# Patient Record
Sex: Female | Born: 1950 | Race: White | Hispanic: No | Marital: Married | Smoking: Former smoker
Health system: Southern US, Community
[De-identification: ages and names within clinical notes are randomized; demographics above are authoritative.]

## PROBLEM LIST (undated history)

## (undated) DIAGNOSIS — I10 Essential (primary) hypertension: Secondary | ICD-10-CM

## (undated) DIAGNOSIS — I219 Acute myocardial infarction, unspecified: Secondary | ICD-10-CM

## (undated) DIAGNOSIS — E079 Disorder of thyroid, unspecified: Secondary | ICD-10-CM

## (undated) HISTORY — DX: Acute myocardial infarction, unspecified: I21.9

## (undated) HISTORY — DX: Essential (primary) hypertension: I10

## (undated) HISTORY — DX: Disorder of thyroid, unspecified: E07.9

## (undated) HISTORY — PX: CARDIAC CATHETERIZATION: SHX172

## (undated) HISTORY — PX: CHOLECYSTECTOMY: SHX55

## (undated) HISTORY — PX: CORONARY ANGIOPLASTY WITH STENT PLACEMENT: SHX49

## (undated) HISTORY — PX: ABDOMINAL HYSTERECTOMY: SHX81

---

## 1998-09-07 ENCOUNTER — Other Ambulatory Visit: Admission: RE | Admit: 1998-09-07 | Discharge: 1998-09-07 | Payer: Self-pay | Admitting: Obstetrics and Gynecology

## 2000-01-29 ENCOUNTER — Other Ambulatory Visit: Admission: RE | Admit: 2000-01-29 | Discharge: 2000-01-29 | Payer: Self-pay | Admitting: Obstetrics and Gynecology

## 2001-04-09 ENCOUNTER — Other Ambulatory Visit: Admission: RE | Admit: 2001-04-09 | Discharge: 2001-04-09 | Payer: Self-pay | Admitting: Obstetrics and Gynecology

## 2001-11-23 ENCOUNTER — Other Ambulatory Visit: Admission: RE | Admit: 2001-11-23 | Discharge: 2001-11-23 | Payer: Self-pay | Admitting: Obstetrics and Gynecology

## 2003-09-05 ENCOUNTER — Other Ambulatory Visit: Admission: RE | Admit: 2003-09-05 | Discharge: 2003-09-05 | Payer: Self-pay | Admitting: Obstetrics and Gynecology

## 2004-09-27 ENCOUNTER — Other Ambulatory Visit: Admission: RE | Admit: 2004-09-27 | Discharge: 2004-09-27 | Payer: Self-pay | Admitting: Obstetrics and Gynecology

## 2005-01-14 ENCOUNTER — Inpatient Hospital Stay (HOSPITAL_COMMUNITY): Admission: RE | Admit: 2005-01-14 | Discharge: 2005-01-17 | Payer: Self-pay | Admitting: Obstetrics and Gynecology

## 2011-07-23 ENCOUNTER — Other Ambulatory Visit (HOSPITAL_COMMUNITY): Payer: Self-pay | Admitting: Obstetrics and Gynecology

## 2011-07-24 ENCOUNTER — Ambulatory Visit (HOSPITAL_COMMUNITY)
Admission: RE | Admit: 2011-07-24 | Discharge: 2011-07-24 | Disposition: A | Discharge status: Home / Self Care | Payer: PRIVATE HEALTH INSURANCE | Source: Ambulatory Visit | Attending: Obstetrics and Gynecology | Admitting: Obstetrics and Gynecology

## 2011-07-24 DIAGNOSIS — R109 Unspecified abdominal pain: Secondary | ICD-10-CM | POA: Insufficient documentation

## 2011-07-24 DIAGNOSIS — K5730 Diverticulosis of large intestine without perforation or abscess without bleeding: Secondary | ICD-10-CM | POA: Insufficient documentation

## 2012-10-26 DIAGNOSIS — J069 Acute upper respiratory infection, unspecified: Secondary | ICD-10-CM | POA: Diagnosis not present

## 2013-03-25 DIAGNOSIS — S63639A Sprain of interphalangeal joint of unspecified finger, initial encounter: Secondary | ICD-10-CM | POA: Diagnosis not present

## 2013-05-02 ENCOUNTER — Other Ambulatory Visit: Payer: Self-pay | Admitting: Obstetrics and Gynecology

## 2013-05-02 DIAGNOSIS — Z1212 Encounter for screening for malignant neoplasm of rectum: Secondary | ICD-10-CM | POA: Diagnosis not present

## 2013-05-02 DIAGNOSIS — Z01419 Encounter for gynecological examination (general) (routine) without abnormal findings: Secondary | ICD-10-CM | POA: Diagnosis not present

## 2013-05-02 DIAGNOSIS — Z1231 Encounter for screening mammogram for malignant neoplasm of breast: Secondary | ICD-10-CM | POA: Diagnosis not present

## 2013-06-16 DIAGNOSIS — Z7982 Long term (current) use of aspirin: Secondary | ICD-10-CM | POA: Diagnosis not present

## 2013-06-16 DIAGNOSIS — R079 Chest pain, unspecified: Secondary | ICD-10-CM | POA: Diagnosis not present

## 2013-06-16 DIAGNOSIS — E038 Other specified hypothyroidism: Secondary | ICD-10-CM | POA: Diagnosis not present

## 2013-06-16 DIAGNOSIS — R0789 Other chest pain: Secondary | ICD-10-CM | POA: Diagnosis not present

## 2013-06-16 DIAGNOSIS — I208 Other forms of angina pectoris: Secondary | ICD-10-CM | POA: Diagnosis not present

## 2013-06-16 DIAGNOSIS — I2510 Atherosclerotic heart disease of native coronary artery without angina pectoris: Secondary | ICD-10-CM | POA: Diagnosis not present

## 2013-06-16 DIAGNOSIS — E785 Hyperlipidemia, unspecified: Secondary | ICD-10-CM | POA: Diagnosis not present

## 2013-06-16 DIAGNOSIS — R21 Rash and other nonspecific skin eruption: Secondary | ICD-10-CM | POA: Diagnosis not present

## 2013-06-16 DIAGNOSIS — R0989 Other specified symptoms and signs involving the circulatory and respiratory systems: Secondary | ICD-10-CM | POA: Diagnosis not present

## 2013-06-16 DIAGNOSIS — Z79899 Other long term (current) drug therapy: Secondary | ICD-10-CM | POA: Diagnosis not present

## 2013-06-16 DIAGNOSIS — R072 Precordial pain: Secondary | ICD-10-CM | POA: Diagnosis not present

## 2013-06-17 DIAGNOSIS — R21 Rash and other nonspecific skin eruption: Secondary | ICD-10-CM | POA: Diagnosis not present

## 2013-06-17 DIAGNOSIS — Z79899 Other long term (current) drug therapy: Secondary | ICD-10-CM | POA: Diagnosis not present

## 2013-06-17 DIAGNOSIS — Z7982 Long term (current) use of aspirin: Secondary | ICD-10-CM | POA: Diagnosis not present

## 2013-06-17 DIAGNOSIS — I2510 Atherosclerotic heart disease of native coronary artery without angina pectoris: Secondary | ICD-10-CM | POA: Diagnosis not present

## 2013-06-17 DIAGNOSIS — Z9861 Coronary angioplasty status: Secondary | ICD-10-CM | POA: Diagnosis not present

## 2013-06-17 DIAGNOSIS — R072 Precordial pain: Secondary | ICD-10-CM | POA: Diagnosis not present

## 2013-06-17 DIAGNOSIS — R0789 Other chest pain: Secondary | ICD-10-CM | POA: Diagnosis not present

## 2013-06-28 DIAGNOSIS — E785 Hyperlipidemia, unspecified: Secondary | ICD-10-CM | POA: Diagnosis not present

## 2013-06-28 DIAGNOSIS — I2510 Atherosclerotic heart disease of native coronary artery without angina pectoris: Secondary | ICD-10-CM | POA: Diagnosis not present

## 2013-06-28 DIAGNOSIS — I10 Essential (primary) hypertension: Secondary | ICD-10-CM | POA: Diagnosis not present

## 2013-06-28 DIAGNOSIS — E039 Hypothyroidism, unspecified: Secondary | ICD-10-CM | POA: Diagnosis not present

## 2013-06-28 DIAGNOSIS — Z0000 Encounter for general adult medical examination without abnormal findings: Secondary | ICD-10-CM | POA: Diagnosis not present

## 2013-09-02 IMAGING — CT CT ABD-PELV W/O CM
1 of 2 series · 15 of 32 positions shown, 19 images · non-contrast
Comparison: None.

CLINICAL DATA: Lower abdominal pain and left flank pain; history of
cholecystectomy and hysterectomy

CT ABDOMEN AND PELVIS WITHOUT CONTRAST
TECHNIQUE: Multidetector CT imaging of the abdomen and pelvis was
performed following the standard protocol without intravenous
contrast.

[Series 2: routine abdomen/pelvis with · axial · 0.83mm/px · z∈[-460,-40]mm · 15 of 94 slices shown, 19 images]
[im 5/94  soft-tissue]
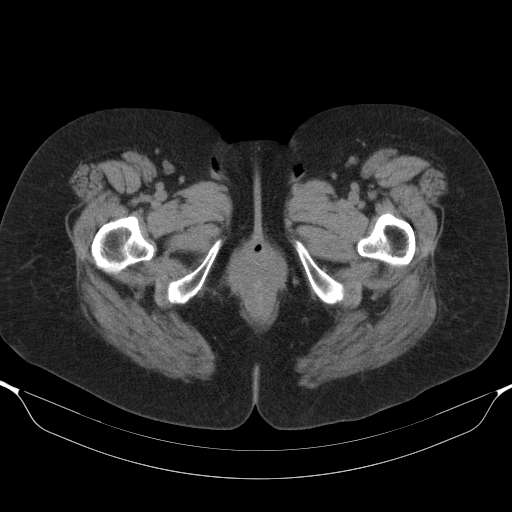
[im 5/94  bone]
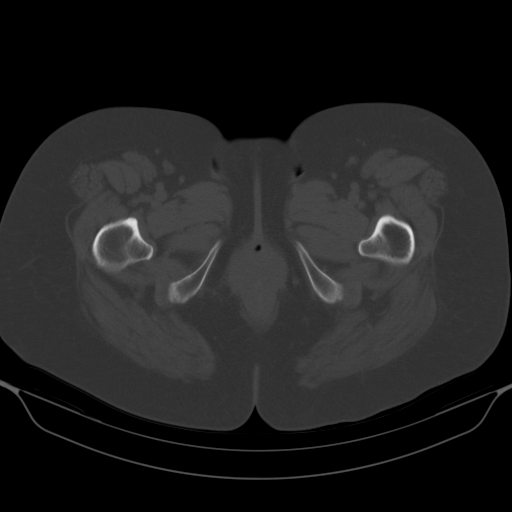
[im 13/94  soft-tissue]
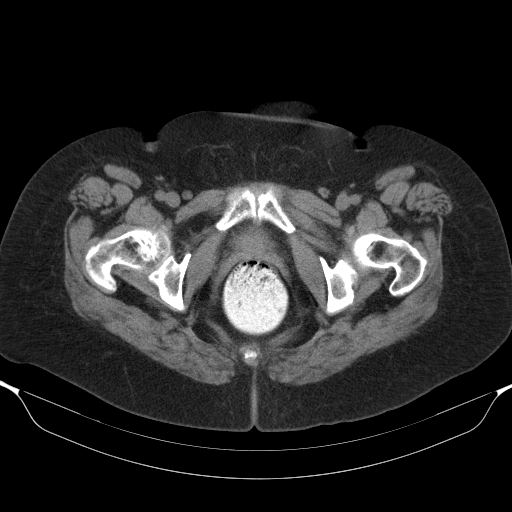
[im 21/94  soft-tissue]
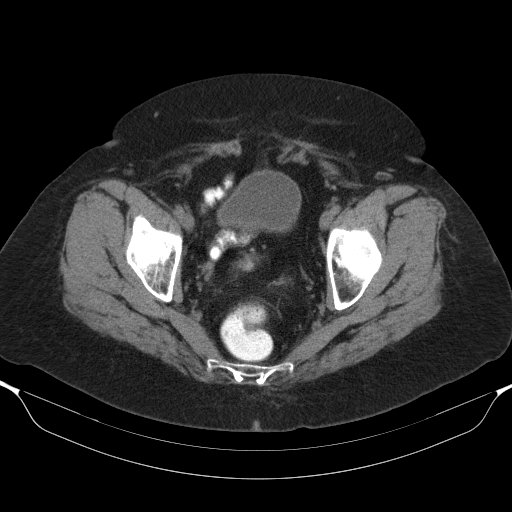
[im 25/94  soft-tissue]
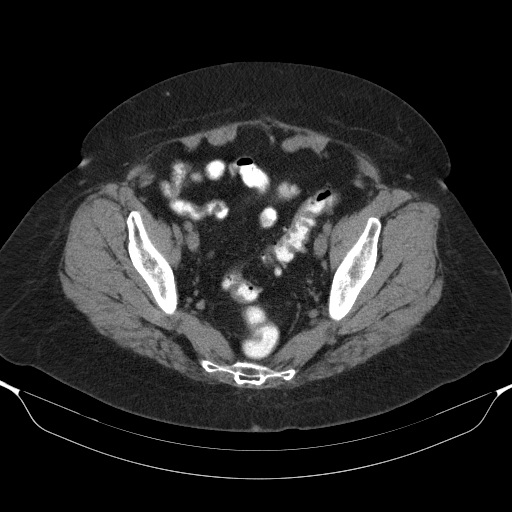
[im 33/94  soft-tissue]
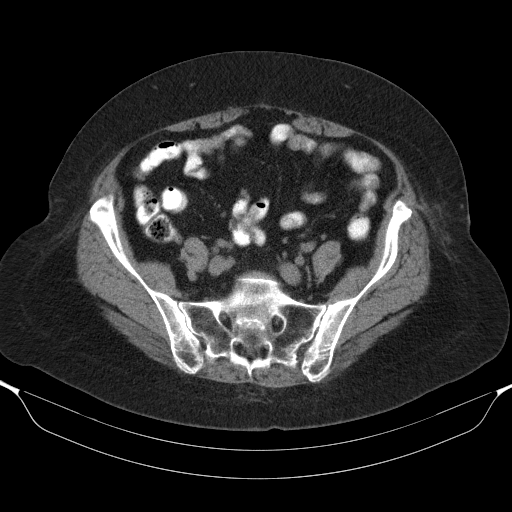
[im 41/94  soft-tissue]
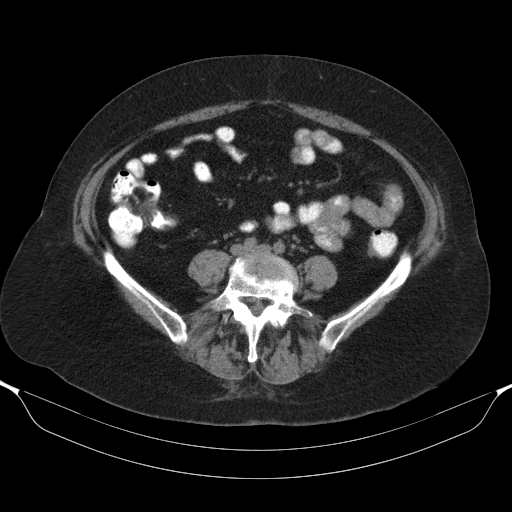
[im 49/94  soft-tissue]
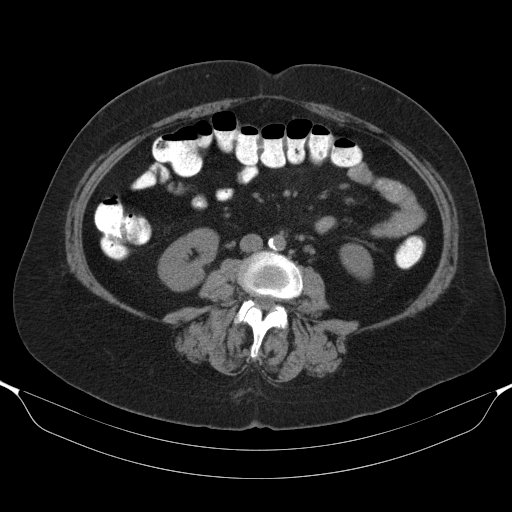
[im 53/94  soft-tissue]
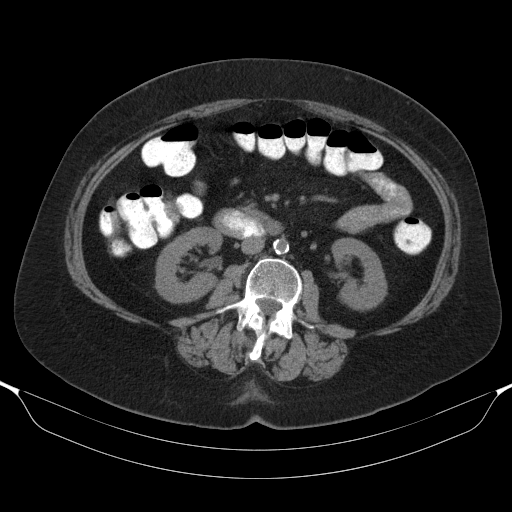
[im 61/94  soft-tissue]
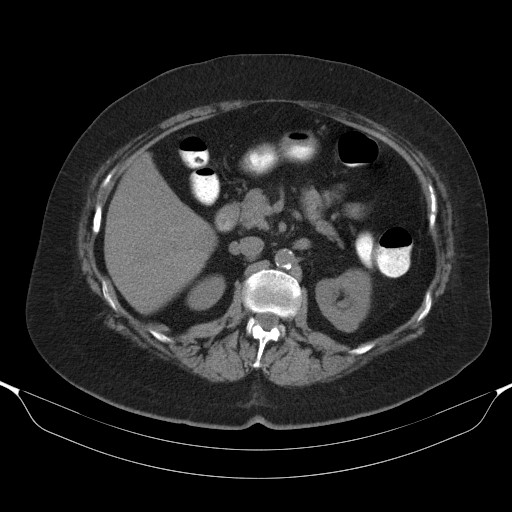
[im 61/94  bone]
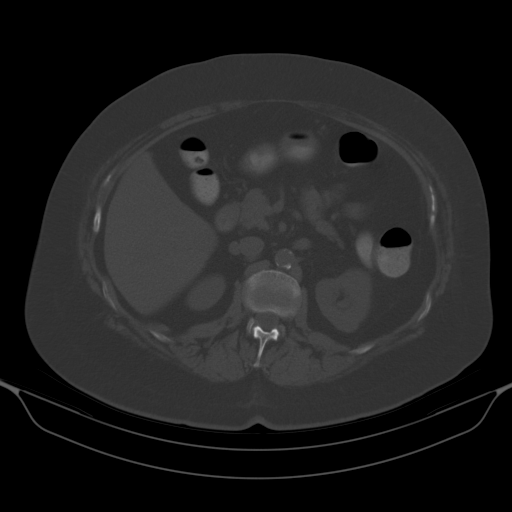
[im 69/94  soft-tissue]
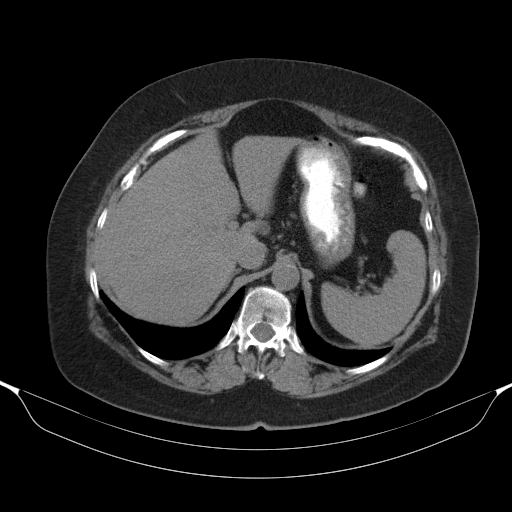
[im 73/94  soft-tissue]
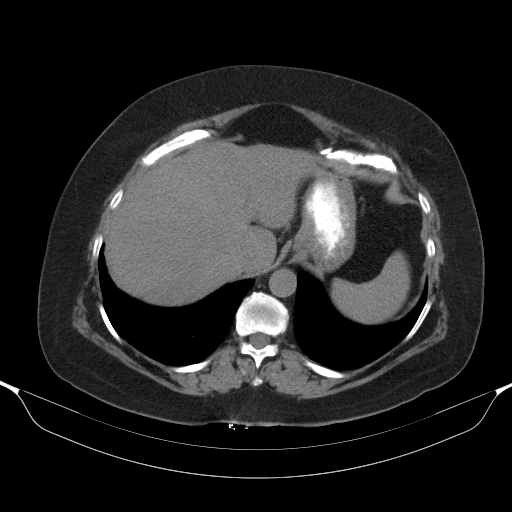
[im 77/94  lung]
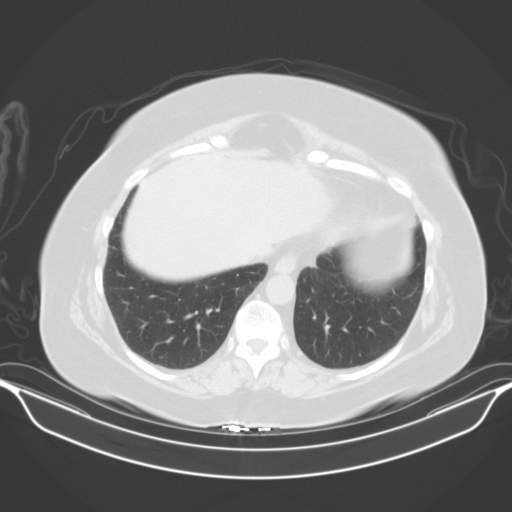
[im 81/94  soft-tissue]
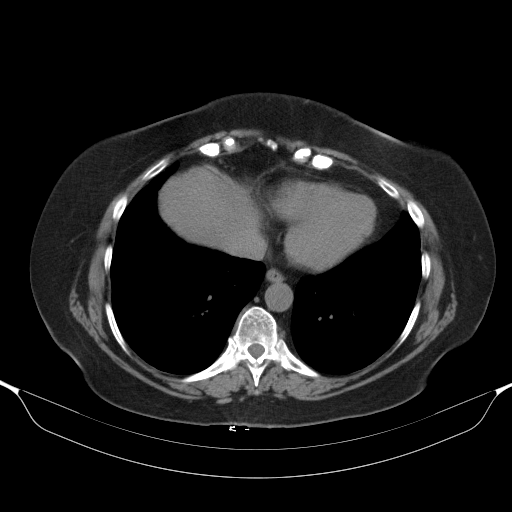
[im 81/94  lung]
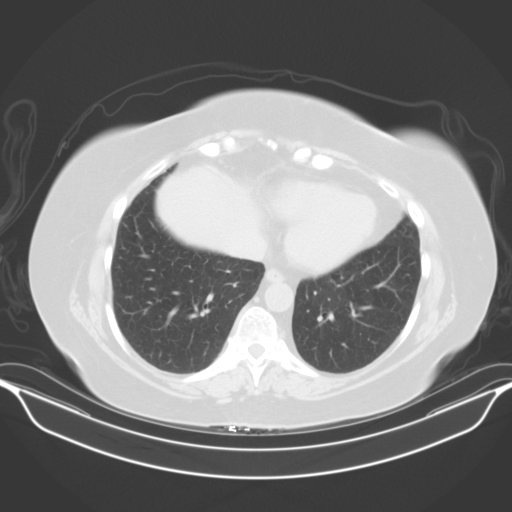
[im 85/94  lung]
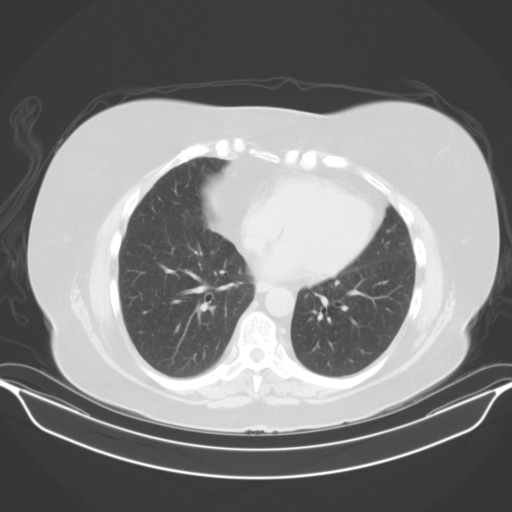
[im 89/94  soft-tissue]
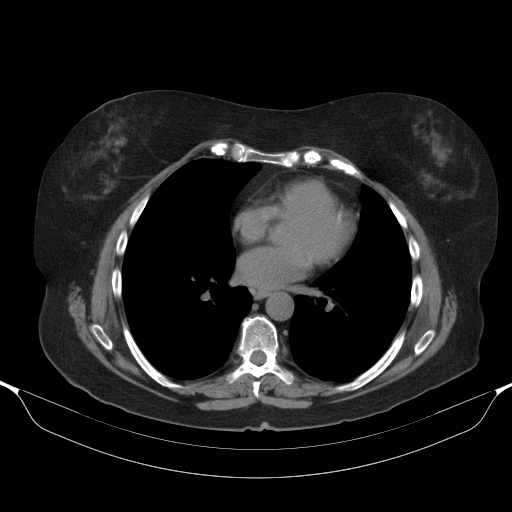
[im 89/94  lung]
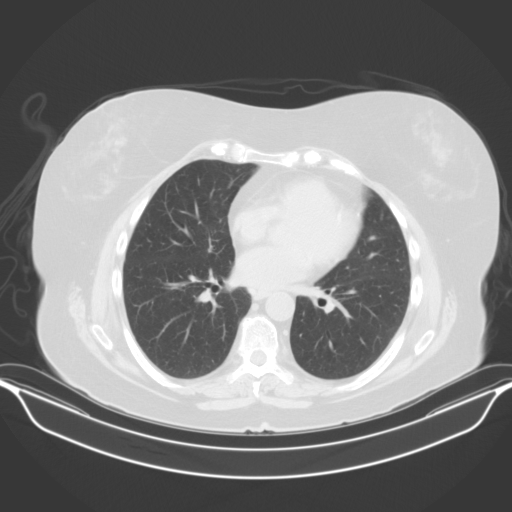

[15 of 32 positions shown; findings below may reference images not displayed]

FINDINGS: The lung bases are clear.  The liver, spleen, pancreas,
adrenal glands, kidneys, urinary bladder have an unremarkable
noncontrasted appearance.  The gallbladder has been surgically
excised.  The appendix is seen in the right lower quadrant and has
a normal appearance.  There are degenerative changes in the lower
lumbar spine. Aortic and coronary artery atherosclerotic
calcification is present.

There is sigmoid diverticulosis with mild associated bowel wall
thickening. There is no adjacent stranding.  There is no pneumo
peritoneum, free fluid, or focal fluid collection.
IMPRESSION: There is sigmoid diverticulosis with slight associated bowel wall
thickening which suggests mild diverticulitis.

## 2013-11-23 DIAGNOSIS — I2510 Atherosclerotic heart disease of native coronary artery without angina pectoris: Secondary | ICD-10-CM | POA: Diagnosis not present

## 2013-11-23 DIAGNOSIS — M5030 Other cervical disc degeneration, unspecified cervical region: Secondary | ICD-10-CM | POA: Diagnosis not present

## 2013-11-23 DIAGNOSIS — E785 Hyperlipidemia, unspecified: Secondary | ICD-10-CM | POA: Diagnosis not present

## 2013-11-23 DIAGNOSIS — J301 Allergic rhinitis due to pollen: Secondary | ICD-10-CM | POA: Diagnosis not present

## 2013-11-23 DIAGNOSIS — I10 Essential (primary) hypertension: Secondary | ICD-10-CM | POA: Diagnosis not present

## 2013-12-05 DIAGNOSIS — E785 Hyperlipidemia, unspecified: Secondary | ICD-10-CM | POA: Diagnosis not present

## 2013-12-05 DIAGNOSIS — I447 Left bundle-branch block, unspecified: Secondary | ICD-10-CM | POA: Diagnosis not present

## 2013-12-05 DIAGNOSIS — R002 Palpitations: Secondary | ICD-10-CM | POA: Diagnosis not present

## 2013-12-05 DIAGNOSIS — I2510 Atherosclerotic heart disease of native coronary artery without angina pectoris: Secondary | ICD-10-CM | POA: Diagnosis not present

## 2013-12-05 DIAGNOSIS — I10 Essential (primary) hypertension: Secondary | ICD-10-CM | POA: Diagnosis not present

## 2014-01-08 DIAGNOSIS — I447 Left bundle-branch block, unspecified: Secondary | ICD-10-CM | POA: Diagnosis not present

## 2014-01-08 DIAGNOSIS — Z7982 Long term (current) use of aspirin: Secondary | ICD-10-CM | POA: Diagnosis not present

## 2014-01-08 DIAGNOSIS — E039 Hypothyroidism, unspecified: Secondary | ICD-10-CM | POA: Diagnosis present

## 2014-01-08 DIAGNOSIS — Z9861 Coronary angioplasty status: Secondary | ICD-10-CM | POA: Diagnosis not present

## 2014-01-08 DIAGNOSIS — I252 Old myocardial infarction: Secondary | ICD-10-CM | POA: Diagnosis not present

## 2014-01-08 DIAGNOSIS — I10 Essential (primary) hypertension: Secondary | ICD-10-CM | POA: Diagnosis present

## 2014-01-08 DIAGNOSIS — Z79899 Other long term (current) drug therapy: Secondary | ICD-10-CM | POA: Diagnosis not present

## 2014-01-08 DIAGNOSIS — R42 Dizziness and giddiness: Secondary | ICD-10-CM | POA: Diagnosis not present

## 2014-01-08 DIAGNOSIS — R0789 Other chest pain: Secondary | ICD-10-CM | POA: Diagnosis not present

## 2014-01-08 DIAGNOSIS — R9431 Abnormal electrocardiogram [ECG] [EKG]: Secondary | ICD-10-CM | POA: Diagnosis not present

## 2014-01-08 DIAGNOSIS — R079 Chest pain, unspecified: Secondary | ICD-10-CM | POA: Diagnosis not present

## 2014-01-08 DIAGNOSIS — I2510 Atherosclerotic heart disease of native coronary artery without angina pectoris: Secondary | ICD-10-CM | POA: Diagnosis not present

## 2014-01-08 DIAGNOSIS — E7800 Pure hypercholesterolemia, unspecified: Secondary | ICD-10-CM | POA: Diagnosis not present

## 2014-01-08 DIAGNOSIS — F410 Panic disorder [episodic paroxysmal anxiety] without agoraphobia: Secondary | ICD-10-CM | POA: Diagnosis present

## 2014-08-28 DIAGNOSIS — J189 Pneumonia, unspecified organism: Secondary | ICD-10-CM | POA: Diagnosis not present

## 2014-08-28 DIAGNOSIS — J301 Allergic rhinitis due to pollen: Secondary | ICD-10-CM | POA: Diagnosis not present

## 2014-08-28 DIAGNOSIS — Z6833 Body mass index (BMI) 33.0-33.9, adult: Secondary | ICD-10-CM | POA: Diagnosis not present

## 2014-08-28 DIAGNOSIS — K210 Gastro-esophageal reflux disease with esophagitis: Secondary | ICD-10-CM | POA: Diagnosis not present

## 2014-09-12 ENCOUNTER — Other Ambulatory Visit: Payer: Self-pay | Admitting: Obstetrics and Gynecology

## 2014-09-12 DIAGNOSIS — Z1212 Encounter for screening for malignant neoplasm of rectum: Secondary | ICD-10-CM | POA: Diagnosis not present

## 2014-09-12 DIAGNOSIS — Z01419 Encounter for gynecological examination (general) (routine) without abnormal findings: Secondary | ICD-10-CM | POA: Diagnosis not present

## 2014-09-12 DIAGNOSIS — Z1231 Encounter for screening mammogram for malignant neoplasm of breast: Secondary | ICD-10-CM | POA: Diagnosis not present

## 2014-09-13 LAB — CYTOLOGY - PAP

## 2015-01-16 DIAGNOSIS — K210 Gastro-esophageal reflux disease with esophagitis: Secondary | ICD-10-CM | POA: Diagnosis not present

## 2015-01-16 DIAGNOSIS — E785 Hyperlipidemia, unspecified: Secondary | ICD-10-CM | POA: Diagnosis not present

## 2015-01-16 DIAGNOSIS — I10 Essential (primary) hypertension: Secondary | ICD-10-CM | POA: Diagnosis not present

## 2015-01-16 DIAGNOSIS — J301 Allergic rhinitis due to pollen: Secondary | ICD-10-CM | POA: Diagnosis not present

## 2015-01-16 DIAGNOSIS — Z6833 Body mass index (BMI) 33.0-33.9, adult: Secondary | ICD-10-CM | POA: Diagnosis not present

## 2015-01-16 DIAGNOSIS — G459 Transient cerebral ischemic attack, unspecified: Secondary | ICD-10-CM | POA: Diagnosis not present

## 2015-01-16 DIAGNOSIS — E559 Vitamin D deficiency, unspecified: Secondary | ICD-10-CM | POA: Diagnosis not present

## 2015-01-16 DIAGNOSIS — E038 Other specified hypothyroidism: Secondary | ICD-10-CM | POA: Diagnosis not present

## 2015-02-12 DIAGNOSIS — H5319 Other subjective visual disturbances: Secondary | ICD-10-CM | POA: Diagnosis not present

## 2015-02-12 DIAGNOSIS — H2513 Age-related nuclear cataract, bilateral: Secondary | ICD-10-CM | POA: Diagnosis not present

## 2015-03-16 DIAGNOSIS — I10 Essential (primary) hypertension: Secondary | ICD-10-CM | POA: Diagnosis not present

## 2015-03-16 DIAGNOSIS — J301 Allergic rhinitis due to pollen: Secondary | ICD-10-CM | POA: Diagnosis not present

## 2015-03-16 DIAGNOSIS — E038 Other specified hypothyroidism: Secondary | ICD-10-CM | POA: Diagnosis not present

## 2015-03-16 DIAGNOSIS — E785 Hyperlipidemia, unspecified: Secondary | ICD-10-CM | POA: Diagnosis not present

## 2015-03-16 DIAGNOSIS — I2510 Atherosclerotic heart disease of native coronary artery without angina pectoris: Secondary | ICD-10-CM | POA: Diagnosis not present

## 2015-04-18 DIAGNOSIS — Z6832 Body mass index (BMI) 32.0-32.9, adult: Secondary | ICD-10-CM | POA: Diagnosis not present

## 2015-04-18 DIAGNOSIS — I25119 Atherosclerotic heart disease of native coronary artery with unspecified angina pectoris: Secondary | ICD-10-CM | POA: Diagnosis not present

## 2015-04-18 DIAGNOSIS — F419 Anxiety disorder, unspecified: Secondary | ICD-10-CM | POA: Diagnosis not present

## 2015-04-18 DIAGNOSIS — E785 Hyperlipidemia, unspecified: Secondary | ICD-10-CM | POA: Diagnosis not present

## 2015-04-18 DIAGNOSIS — J301 Allergic rhinitis due to pollen: Secondary | ICD-10-CM | POA: Diagnosis not present

## 2015-04-18 DIAGNOSIS — I10 Essential (primary) hypertension: Secondary | ICD-10-CM | POA: Diagnosis not present

## 2015-04-18 DIAGNOSIS — E038 Other specified hypothyroidism: Secondary | ICD-10-CM | POA: Diagnosis not present

## 2015-04-18 DIAGNOSIS — M818 Other osteoporosis without current pathological fracture: Secondary | ICD-10-CM | POA: Diagnosis not present

## 2015-04-18 DIAGNOSIS — E538 Deficiency of other specified B group vitamins: Secondary | ICD-10-CM | POA: Diagnosis not present

## 2015-05-01 DIAGNOSIS — E785 Hyperlipidemia, unspecified: Secondary | ICD-10-CM | POA: Diagnosis not present

## 2015-05-01 DIAGNOSIS — R0602 Shortness of breath: Secondary | ICD-10-CM | POA: Diagnosis not present

## 2015-05-01 DIAGNOSIS — I10 Essential (primary) hypertension: Secondary | ICD-10-CM | POA: Diagnosis not present

## 2015-05-01 DIAGNOSIS — J219 Acute bronchiolitis, unspecified: Secondary | ICD-10-CM | POA: Diagnosis not present

## 2015-05-01 DIAGNOSIS — R05 Cough: Secondary | ICD-10-CM | POA: Diagnosis not present

## 2015-05-01 DIAGNOSIS — I25119 Atherosclerotic heart disease of native coronary artery with unspecified angina pectoris: Secondary | ICD-10-CM | POA: Diagnosis not present

## 2015-05-01 DIAGNOSIS — E038 Other specified hypothyroidism: Secondary | ICD-10-CM | POA: Diagnosis not present

## 2015-05-01 DIAGNOSIS — J301 Allergic rhinitis due to pollen: Secondary | ICD-10-CM | POA: Diagnosis not present

## 2015-06-01 DIAGNOSIS — M25512 Pain in left shoulder: Secondary | ICD-10-CM | POA: Diagnosis not present

## 2015-06-01 DIAGNOSIS — R079 Chest pain, unspecified: Secondary | ICD-10-CM | POA: Diagnosis not present

## 2015-06-01 DIAGNOSIS — M79602 Pain in left arm: Secondary | ICD-10-CM | POA: Diagnosis not present

## 2015-06-01 DIAGNOSIS — Z6832 Body mass index (BMI) 32.0-32.9, adult: Secondary | ICD-10-CM | POA: Diagnosis not present

## 2015-08-31 DIAGNOSIS — I509 Heart failure, unspecified: Secondary | ICD-10-CM | POA: Diagnosis not present

## 2015-08-31 DIAGNOSIS — K210 Gastro-esophageal reflux disease with esophagitis: Secondary | ICD-10-CM | POA: Diagnosis not present

## 2015-08-31 DIAGNOSIS — T63441A Toxic effect of venom of bees, accidental (unintentional), initial encounter: Secondary | ICD-10-CM | POA: Diagnosis not present

## 2015-08-31 DIAGNOSIS — E785 Hyperlipidemia, unspecified: Secondary | ICD-10-CM | POA: Diagnosis not present

## 2015-08-31 DIAGNOSIS — E559 Vitamin D deficiency, unspecified: Secondary | ICD-10-CM | POA: Diagnosis not present

## 2015-08-31 DIAGNOSIS — E038 Other specified hypothyroidism: Secondary | ICD-10-CM | POA: Diagnosis not present

## 2015-08-31 DIAGNOSIS — J301 Allergic rhinitis due to pollen: Secondary | ICD-10-CM | POA: Diagnosis not present

## 2015-08-31 DIAGNOSIS — E538 Deficiency of other specified B group vitamins: Secondary | ICD-10-CM | POA: Diagnosis not present

## 2015-08-31 DIAGNOSIS — I10 Essential (primary) hypertension: Secondary | ICD-10-CM | POA: Diagnosis not present

## 2015-08-31 DIAGNOSIS — R21 Rash and other nonspecific skin eruption: Secondary | ICD-10-CM | POA: Diagnosis not present

## 2015-08-31 DIAGNOSIS — R739 Hyperglycemia, unspecified: Secondary | ICD-10-CM | POA: Diagnosis not present

## 2015-08-31 DIAGNOSIS — I25119 Atherosclerotic heart disease of native coronary artery with unspecified angina pectoris: Secondary | ICD-10-CM | POA: Diagnosis not present

## 2015-09-26 DIAGNOSIS — Z1231 Encounter for screening mammogram for malignant neoplasm of breast: Secondary | ICD-10-CM | POA: Diagnosis not present

## 2015-09-26 DIAGNOSIS — Z01419 Encounter for gynecological examination (general) (routine) without abnormal findings: Secondary | ICD-10-CM | POA: Diagnosis not present

## 2015-09-26 DIAGNOSIS — Z1212 Encounter for screening for malignant neoplasm of rectum: Secondary | ICD-10-CM | POA: Diagnosis not present

## 2015-09-26 DIAGNOSIS — Z6832 Body mass index (BMI) 32.0-32.9, adult: Secondary | ICD-10-CM | POA: Diagnosis not present

## 2015-09-28 ENCOUNTER — Other Ambulatory Visit: Payer: Self-pay | Admitting: Obstetrics and Gynecology

## 2015-09-28 DIAGNOSIS — R928 Other abnormal and inconclusive findings on diagnostic imaging of breast: Secondary | ICD-10-CM

## 2015-10-04 ENCOUNTER — Ambulatory Visit
Admission: RE | Admit: 2015-10-04 | Discharge: 2015-10-04 | Disposition: A | Discharge status: Home / Self Care | Payer: Medicare Other | Source: Ambulatory Visit | Attending: Obstetrics and Gynecology | Admitting: Obstetrics and Gynecology

## 2015-10-04 DIAGNOSIS — R928 Other abnormal and inconclusive findings on diagnostic imaging of breast: Secondary | ICD-10-CM | POA: Diagnosis not present

## 2015-11-14 DIAGNOSIS — L299 Pruritus, unspecified: Secondary | ICD-10-CM | POA: Diagnosis not present

## 2015-11-14 DIAGNOSIS — L719 Rosacea, unspecified: Secondary | ICD-10-CM | POA: Diagnosis not present

## 2015-11-14 DIAGNOSIS — L821 Other seborrheic keratosis: Secondary | ICD-10-CM | POA: Diagnosis not present

## 2015-12-02 DIAGNOSIS — I252 Old myocardial infarction: Secondary | ICD-10-CM | POA: Diagnosis not present

## 2015-12-02 DIAGNOSIS — Z7982 Long term (current) use of aspirin: Secondary | ICD-10-CM | POA: Diagnosis not present

## 2015-12-02 DIAGNOSIS — R42 Dizziness and giddiness: Secondary | ICD-10-CM | POA: Diagnosis not present

## 2015-12-02 DIAGNOSIS — J329 Chronic sinusitis, unspecified: Secondary | ICD-10-CM | POA: Diagnosis not present

## 2015-12-02 DIAGNOSIS — R05 Cough: Secondary | ICD-10-CM | POA: Diagnosis not present

## 2015-12-02 DIAGNOSIS — I159 Secondary hypertension, unspecified: Secondary | ICD-10-CM | POA: Diagnosis not present

## 2015-12-02 DIAGNOSIS — R0789 Other chest pain: Secondary | ICD-10-CM | POA: Diagnosis not present

## 2015-12-02 DIAGNOSIS — R0981 Nasal congestion: Secondary | ICD-10-CM | POA: Diagnosis not present

## 2015-12-02 DIAGNOSIS — R110 Nausea: Secondary | ICD-10-CM | POA: Diagnosis not present

## 2016-09-19 DIAGNOSIS — J301 Allergic rhinitis due to pollen: Secondary | ICD-10-CM | POA: Diagnosis not present

## 2016-09-19 DIAGNOSIS — Z6831 Body mass index (BMI) 31.0-31.9, adult: Secondary | ICD-10-CM | POA: Diagnosis not present

## 2016-09-19 DIAGNOSIS — E538 Deficiency of other specified B group vitamins: Secondary | ICD-10-CM | POA: Diagnosis not present

## 2016-09-19 DIAGNOSIS — F419 Anxiety disorder, unspecified: Secondary | ICD-10-CM | POA: Diagnosis not present

## 2016-09-19 DIAGNOSIS — E559 Vitamin D deficiency, unspecified: Secondary | ICD-10-CM | POA: Diagnosis not present

## 2016-09-19 DIAGNOSIS — L309 Dermatitis, unspecified: Secondary | ICD-10-CM | POA: Diagnosis not present

## 2016-09-19 DIAGNOSIS — E063 Autoimmune thyroiditis: Secondary | ICD-10-CM | POA: Diagnosis not present

## 2016-09-19 DIAGNOSIS — E785 Hyperlipidemia, unspecified: Secondary | ICD-10-CM | POA: Diagnosis not present

## 2016-09-19 DIAGNOSIS — K210 Gastro-esophageal reflux disease with esophagitis: Secondary | ICD-10-CM | POA: Diagnosis not present

## 2016-09-19 DIAGNOSIS — T63441A Toxic effect of venom of bees, accidental (unintentional), initial encounter: Secondary | ICD-10-CM | POA: Diagnosis not present

## 2016-09-19 DIAGNOSIS — I10 Essential (primary) hypertension: Secondary | ICD-10-CM | POA: Diagnosis not present

## 2016-09-19 DIAGNOSIS — I25119 Atherosclerotic heart disease of native coronary artery with unspecified angina pectoris: Secondary | ICD-10-CM | POA: Diagnosis not present

## 2016-12-08 DIAGNOSIS — H524 Presbyopia: Secondary | ICD-10-CM | POA: Diagnosis not present

## 2016-12-16 DIAGNOSIS — E063 Autoimmune thyroiditis: Secondary | ICD-10-CM | POA: Diagnosis not present

## 2016-12-16 DIAGNOSIS — F419 Anxiety disorder, unspecified: Secondary | ICD-10-CM | POA: Diagnosis not present

## 2016-12-16 DIAGNOSIS — R6889 Other general symptoms and signs: Secondary | ICD-10-CM | POA: Diagnosis not present

## 2016-12-16 DIAGNOSIS — J301 Allergic rhinitis due to pollen: Secondary | ICD-10-CM | POA: Diagnosis not present

## 2016-12-16 DIAGNOSIS — I25119 Atherosclerotic heart disease of native coronary artery with unspecified angina pectoris: Secondary | ICD-10-CM | POA: Diagnosis not present

## 2016-12-16 DIAGNOSIS — Z6832 Body mass index (BMI) 32.0-32.9, adult: Secondary | ICD-10-CM | POA: Diagnosis not present

## 2016-12-16 DIAGNOSIS — R739 Hyperglycemia, unspecified: Secondary | ICD-10-CM | POA: Diagnosis not present

## 2016-12-16 DIAGNOSIS — J189 Pneumonia, unspecified organism: Secondary | ICD-10-CM | POA: Diagnosis not present

## 2016-12-16 DIAGNOSIS — I10 Essential (primary) hypertension: Secondary | ICD-10-CM | POA: Diagnosis not present

## 2016-12-16 DIAGNOSIS — E785 Hyperlipidemia, unspecified: Secondary | ICD-10-CM | POA: Diagnosis not present

## 2016-12-16 DIAGNOSIS — L309 Dermatitis, unspecified: Secondary | ICD-10-CM | POA: Diagnosis not present

## 2017-03-05 DIAGNOSIS — S161XXA Strain of muscle, fascia and tendon at neck level, initial encounter: Secondary | ICD-10-CM | POA: Diagnosis not present

## 2017-03-05 DIAGNOSIS — M542 Cervicalgia: Secondary | ICD-10-CM | POA: Diagnosis not present

## 2017-03-05 DIAGNOSIS — R0789 Other chest pain: Secondary | ICD-10-CM | POA: Diagnosis not present

## 2017-04-09 DIAGNOSIS — F419 Anxiety disorder, unspecified: Secondary | ICD-10-CM | POA: Diagnosis not present

## 2017-04-09 DIAGNOSIS — E538 Deficiency of other specified B group vitamins: Secondary | ICD-10-CM | POA: Diagnosis not present

## 2017-04-09 DIAGNOSIS — E063 Autoimmune thyroiditis: Secondary | ICD-10-CM | POA: Diagnosis not present

## 2017-04-09 DIAGNOSIS — E785 Hyperlipidemia, unspecified: Secondary | ICD-10-CM | POA: Diagnosis not present

## 2017-04-09 DIAGNOSIS — I25119 Atherosclerotic heart disease of native coronary artery with unspecified angina pectoris: Secondary | ICD-10-CM | POA: Diagnosis not present

## 2017-04-09 DIAGNOSIS — R7989 Other specified abnormal findings of blood chemistry: Secondary | ICD-10-CM | POA: Diagnosis not present

## 2017-06-15 DIAGNOSIS — F419 Anxiety disorder, unspecified: Secondary | ICD-10-CM | POA: Diagnosis not present

## 2017-06-15 DIAGNOSIS — J301 Allergic rhinitis due to pollen: Secondary | ICD-10-CM | POA: Diagnosis not present

## 2017-06-15 DIAGNOSIS — I25119 Atherosclerotic heart disease of native coronary artery with unspecified angina pectoris: Secondary | ICD-10-CM | POA: Diagnosis not present

## 2017-06-15 DIAGNOSIS — I10 Essential (primary) hypertension: Secondary | ICD-10-CM | POA: Diagnosis not present

## 2017-06-15 DIAGNOSIS — E785 Hyperlipidemia, unspecified: Secondary | ICD-10-CM | POA: Diagnosis not present

## 2017-06-15 DIAGNOSIS — E063 Autoimmune thyroiditis: Secondary | ICD-10-CM | POA: Diagnosis not present

## 2017-06-15 DIAGNOSIS — Z1159 Encounter for screening for other viral diseases: Secondary | ICD-10-CM | POA: Diagnosis not present

## 2017-06-15 DIAGNOSIS — Z6829 Body mass index (BMI) 29.0-29.9, adult: Secondary | ICD-10-CM | POA: Diagnosis not present

## 2017-07-13 ENCOUNTER — Encounter: Payer: Self-pay | Admitting: Cardiology

## 2017-07-13 ENCOUNTER — Ambulatory Visit (INDEPENDENT_AMBULATORY_CARE_PROVIDER_SITE_OTHER): Payer: Medicare HMO | Admitting: Cardiology

## 2017-07-13 DIAGNOSIS — E785 Hyperlipidemia, unspecified: Secondary | ICD-10-CM | POA: Diagnosis not present

## 2017-07-13 DIAGNOSIS — I10 Essential (primary) hypertension: Secondary | ICD-10-CM

## 2017-07-13 DIAGNOSIS — I2510 Atherosclerotic heart disease of native coronary artery without angina pectoris: Secondary | ICD-10-CM | POA: Insufficient documentation

## 2017-07-13 DIAGNOSIS — I25119 Atherosclerotic heart disease of native coronary artery with unspecified angina pectoris: Secondary | ICD-10-CM | POA: Insufficient documentation

## 2017-07-13 DIAGNOSIS — I25118 Atherosclerotic heart disease of native coronary artery with other forms of angina pectoris: Secondary | ICD-10-CM | POA: Diagnosis not present

## 2017-07-13 HISTORY — DX: Atherosclerotic heart disease of native coronary artery with unspecified angina pectoris: I25.119

## 2017-07-13 HISTORY — DX: Essential (primary) hypertension: I10

## 2017-07-13 HISTORY — DX: Hyperlipidemia, unspecified: E78.5

## 2017-07-13 MED ORDER — VALSARTAN 160 MG PO TABS: 160.0000 mg | ORAL_TABLET | Freq: Every day | ORAL | 6 refills | Status: DC

## 2017-07-13 NOTE — Progress Notes (Signed)
Cardiology Consultation:    Date:  07/13/2017   ID:  Kaitlyn Jimenez, DOB [birth year], MRN [id number]  PCP:  Olive Bassough, Aryiana Klinkner L, MD  Cardiologist:  Gypsy Balsamobert Ernestyne Caldwell, MD   Referring MD: Olive Bassough, Alexiz Sustaita L, MD   Chief Complaint  Patient presents with  . Follow-up  I would like to be checked out  History of Present Illness:    Kaitlyn Jimenez is a 66 y.o. female who is being seen today for the evaluation of coronary artery disease at the request of Dough, Doris Cheadleobert L, MD.  I saw her last time in 2015.  She does have quite complex past medical history includes multiple interventions in her coronary arteries.  She does have some history of diminished left ventricular ejection fraction as well as dyslipidemia.  She comes to me because her brother passed recently because of massive heart attack and she is worried about problems as having any resting chest pain but with extreme exertion she will get some tightness in the chest.  Another issue is the fact that her blood pressure is very elevated apparently she was switched from valsartan to losartan she has difficulty controlling her blood pressure since that time.  Does not take any cholesterol medication was taking pravastatin but did not tolerate this well and that is why it was discontinued.  He does not exercise on a regular basis but work hard with no difficulties.  Complaining of having some left elbow pain but she thinks that some tennis elbow and truly it hurts only when she move her arm.  Sadly after we finished visit I made all orders he declined to have any laboratory tests done, he declined to have a chest x-ray do not he said he will look for a different cardiologist.  We told him if he change his mind will be available at any time he is willing to follow.  Past Medical History:  Diagnosis Date  . Hypertension   . MI (myocardial infarction) (HCC)   . MI (myocardial infarction) (HCC)   . Thyroid disease     Past Surgical History:    Procedure Laterality Date  . ABDOMINAL HYSTERECTOMY    . CARDIAC CATHETERIZATION    . CHOLECYSTECTOMY      Current Medications: Current Meds  Medication Sig  . ALPRAZolam (XANAX) 1 MG tablet Take 1 mg 2 (two) times daily as needed by mouth for anxiety.  Marland Kitchen. aspirin EC 81 MG tablet Take 324 mg daily by mouth.  . cetirizine (ZYRTEC) 10 MG tablet Take 10 mg daily by mouth.  . EPINEPHrine 0.3 mg/0.3 mL IJ SOAJ injection Inject 0.3 mg once into the muscle.  . famotidine (PEPCID) 20 MG tablet Take 20 mg as needed by mouth for heartburn or indigestion.  Marland Kitchen. levothyroxine (SYNTHROID, LEVOTHROID) 100 MCG tablet Take 1 tablet daily by mouth.  . losartan (COZAAR) 100 MG tablet Take 100 mg daily by mouth.  . metoprolol tartrate (LOPRESSOR) 50 MG tablet Take 50 mg 2 (two) times daily by mouth.     Allergies:   Penciclovir; Shellfish-derived products; Sulfa antibiotics; Contrast media [iodinated diagnostic agents]; Penicillins; and Ramipril   Social History   Socioeconomic History  . Marital status: Married    Spouse name: None  . Number of children: None  . Years of education: None  . Highest education level: None  Social Needs  . Financial resource strain: None  . Food insecurity - worry: None  . Food insecurity - inability: None  .  Transportation needs - medical: None  . Transportation needs - non-medical: None  Occupational History  . None  Tobacco Use  . Smoking status: Former Games developer  . Smokeless tobacco: Never Used  Substance and Sexual Activity  . Alcohol use: No    Frequency: Never  . Drug use: No  . Sexual activity: None  Other Topics Concern  . None  Social History Narrative  . None     Family History: The patient's family history includes Arrhythmia in her sister; Congestive Heart Failure in her brother; Heart attack in her brother and father; Heart disease in her maternal uncle, mother, and paternal uncle; Rheum arthritis in her brother. ROS:   Please see the history  of present illness.    All 14 point review of systems negative except as described per history of present illness.  EKGs/Labs/Other Studies Reviewed:    The following studies were reviewed today:   EKG:  EKG is  ordered today.  The ekg ordered today demonstrates normal sinus rhythm, nonspecific intraventricular conduction delay left bundle branch block type cannot rule out anterior wall microinfarction.  Recent Labs: No results found for requested labs within last 8760 hours.  Recent Lipid Panel No results found for: CHOL, TRIG, HDL, CHOLHDL, VLDL, LDLCALC, LDLDIRECT  Physical Exam:    VS:  BP (!) 200/82   Pulse 94   Resp 12   Ht 5\' 1"  (1.549 m)   Wt 163 lb 12.8 oz (74.3 kg)   BMI 30.95 kg/m     Wt Readings from Last 3 Encounters:  07/13/17 163 lb 12.8 oz (74.3 kg)     GEN:  Well nourished, well developed in no acute distress HEENT: Normal NECK: No JVD; No carotid bruits LYMPHATICS: No lymphadenopathy CARDIAC: RRR, no murmurs, no rubs, no gallops RESPIRATORY:  Clear to auscultation without rales, wheezing or rhonchi  ABDOMEN: Soft, non-tender, non-distended MUSCULOSKELETAL:  No edema; No deformity  SKIN: Warm and dry NEUROLOGIC:  Alert and oriented x 3 PSYCHIATRIC:  Normal affect   ASSESSMENT:    1. Coronary artery disease of native artery of native heart with stable angina pectoris (HCC)   2. Essential hypertension   3. Dyslipidemia    PLAN:    In order of problems listed above:  1. Coronary artery disease with stable angina pectoris.  She is on aspirin as well as beta-blocker which I will continue.  I think the key for now will be to control her blood pressure better.  She said that she is willing to go back on the losartan actually she requested transfer back so we will put her back on valsartan.  She does have prescription for Norvasc we contact her over the phone before the week is over and ask her how she is doing if blood pressure will be still elevated then  we will add Norvasc.  I will schedule her to have echocardiogram to assess left ventricular ejection fraction.  Also told her that she need to be on high intensity statin and she is willing to try another statin but I will do it after I get her blood pressure better under control.  I simply do not want to take too many medications at once to make confusing picture. 2. Essential hypertension: Discussion as above 3. Dyslipidemia: Discussion as above.   Medication Adjustments/Labs and Tests Ordered: Current medicines are reviewed at length with the patient today.  Concerns regarding medicines are outlined above.  No orders of the defined types were placed  in this encounter.  No orders of the defined types were placed in this encounter.   Signed, Georgeanna Lea, MD, [facility]. 07/13/2017 4:16 PM    [facility]

## 2017-07-13 NOTE — Patient Instructions (Signed)
Medication Instructions:  Your physician has recommended you make the following change in your medication:  1.) STOP Losartan. 2.) START Valsartan 160 mg daily.   Labwork: None   Testing/Procedures: Your physician has requested that you have an echocardiogram. Echocardiography is a painless test that uses sound waves to create images of your heart. It provides your doctor with information about the size and shape of your heart and how well your heart's chambers and valves are working. This procedure takes approximately one hour. There are no restrictions for this procedure.   Follow-Up: Your physician recommends that you schedule a follow-up appointment in: 2 weeks  Any Other Special Instructions Will Be Listed Below (If Applicable).  Please note that any paperwork needing to be filled out by the provider will need to be addressed at the front desk prior to seeing the provider. Please note that any paperwork FMLA, Disability or other documents regarding health condition is subject to a $25.00 charge that must be received prior to completion of paperwork in the form of a money order or check.    If you need a refill on your cardiac medications before your next appointment, please call your pharmacy.

## 2017-07-24 ENCOUNTER — Ambulatory Visit (HOSPITAL_BASED_OUTPATIENT_CLINIC_OR_DEPARTMENT_OTHER)
Admission: RE | Admit: 2017-07-24 | Discharge: 2017-07-24 | Disposition: A | Discharge status: Home / Self Care | Payer: BLUE CROSS/BLUE SHIELD | Source: Ambulatory Visit | Attending: Cardiology | Admitting: Cardiology

## 2017-07-24 DIAGNOSIS — I10 Essential (primary) hypertension: Secondary | ICD-10-CM

## 2017-07-24 DIAGNOSIS — E785 Hyperlipidemia, unspecified: Secondary | ICD-10-CM | POA: Diagnosis not present

## 2017-07-24 DIAGNOSIS — I25118 Atherosclerotic heart disease of native coronary artery with other forms of angina pectoris: Secondary | ICD-10-CM

## 2017-07-24 DIAGNOSIS — I501 Left ventricular failure: Secondary | ICD-10-CM | POA: Diagnosis not present

## 2017-07-24 NOTE — Progress Notes (Signed)
Echocardiogram 2D Echocardiogram has been performed.  Kaitlyn Jimenez 07/24/2017, 3:39 PM

## 2017-07-27 ENCOUNTER — Ambulatory Visit (INDEPENDENT_AMBULATORY_CARE_PROVIDER_SITE_OTHER): Payer: Medicare HMO | Admitting: Cardiology

## 2017-07-27 ENCOUNTER — Encounter: Payer: Self-pay | Admitting: Cardiology

## 2017-07-27 VITALS — BP 140/64 | HR 76 | Resp 10 | Ht 61.0 in | Wt 166.1 lb

## 2017-07-27 DIAGNOSIS — I25118 Atherosclerotic heart disease of native coronary artery with other forms of angina pectoris: Secondary | ICD-10-CM | POA: Diagnosis not present

## 2017-07-27 DIAGNOSIS — E785 Hyperlipidemia, unspecified: Secondary | ICD-10-CM | POA: Diagnosis not present

## 2017-07-27 DIAGNOSIS — I10 Essential (primary) hypertension: Secondary | ICD-10-CM | POA: Diagnosis not present

## 2017-07-27 MED ORDER — EZETIMIBE 10 MG PO TABS: 10.0000 mg | ORAL_TABLET | Freq: Every day | ORAL | 3 refills | Status: DC

## 2017-07-27 NOTE — Addendum Note (Signed)
Addended by: Arville Care on: 07/27/2017 04:18 PM   Modules accepted: Orders

## 2017-07-27 NOTE — Progress Notes (Signed)
Cardiology Office Note:    Date:  07/27/2017   ID:  Kaitlyn Jimenez, DOB October 13, 1950, MRN [id number]  PCP:  Guadalupe MapleGage, John F., MD  Cardiologist:  Gypsy Balsamobert Caress Reffitt, MD    Referring MD: Olive Bassough, Telitha Plath L, MD   Chief Complaint  Patient presents with  . 2 week follow up  Doing better  History of Present Illness:    Kaitlyn Jimenez is a 66 y.o. female with history of coronary artery disease, essential hypertension, dyslipidemia with intolerance to multiple statins she had problem with her blood pressure control now is much better after adjusting her medication but still not perfect.  We talked today about potentially increasing dose of valsartan.  However, I would like to check her kidney function first.  I will ask him to have Chem-7 done.  We talked at length about the statin she tried 3 different statins atorvastatin, Crestor, pravastatin every single one gave her a lot of side effects and she does not want to take those medications.  We talked at length about potentially taking Zetia and she accepted that offer we will put her on Zetia 10.  We also started talking about PCSK9 agent and she is willing to explore that option.  First we will try Zetia.  Past Medical History:  Diagnosis Date  . Hypertension   . MI (myocardial infarction) (HCC)   . MI (myocardial infarction) (HCC)   . Thyroid disease       Current Medications: Current Meds  Medication Sig  . ALPRAZolam (XANAX) 1 MG tablet Take 1 mg 2 (two) times daily as needed by mouth for anxiety.  Marland Kitchen. aspirin EC 81 MG tablet Take 324 mg daily by mouth.  . cetirizine (ZYRTEC) 10 MG tablet Take 10 mg daily by mouth.  . EPINEPHrine 0.3 mg/0.3 mL IJ SOAJ injection Inject 0.3 mg once into the muscle.  . famotidine (PEPCID) 20 MG tablet Take 20 mg as needed by mouth for heartburn or indigestion.  Marland Kitchen. levothyroxine (SYNTHROID, LEVOTHROID) 100 MCG tablet Take 1 tablet daily by mouth.  . metoprolol tartrate (LOPRESSOR) 50 MG tablet Take 50 mg 2  (two) times daily by mouth.  . valsartan (DIOVAN) 160 MG tablet Take 1 tablet (160 mg total) daily by mouth.     Allergies:   Penciclovir; Shellfish-derived products; Sulfa antibiotics; Contrast media [iodinated diagnostic agents]; Penicillins; and Ramipril   Social History   Socioeconomic History  . Marital status: Married    Spouse name: None  . Number of children: None  . Years of education: None  . Highest education level: None  Social Needs  . Financial resource strain: None  . Food insecurity - worry: None  . Food insecurity - inability: None  . Transportation needs - medical: None  . Transportation needs - non-medical: None  Occupational History  . None  Tobacco Use  . Smoking status: Former Games developermoker  . Smokeless tobacco: Never Used  Substance and Sexual Activity  . Alcohol use: No    Frequency: Never  . Drug use: No  . Sexual activity: None  Other Topics Concern  . None  Social History Narrative  . None     Family History: The patient's family history includes Arrhythmia in her sister; Congestive Heart Failure in her brother; Heart attack in her brother and father; Heart disease in her maternal uncle, mother, and paternal uncle; Rheum arthritis in her brother. ROS:   Please see the history of present illness.    All 14 point  review of systems negative except as described per history of present illness  EKGs/Labs/Other Studies Reviewed:      Recent Labs: No results found for requested labs within last 8760 hours.  Recent Lipid Panel No results found for: CHOL, TRIG, HDL, CHOLHDL, VLDL, LDLCALC, LDLDIRECT  Physical Exam:    VS:  BP 140/64   Pulse 76   Resp 10   Ht 5\' 1"  (1.549 m)   Wt 166 lb 1.9 oz (75.4 kg)   BMI 31.39 kg/m     Wt Readings from Last 3 Encounters:  07/27/17 166 lb 1.9 oz (75.4 kg)  07/13/17 163 lb 12.8 oz (74.3 kg)     GEN:  Well nourished, well developed in no acute distress HEENT: Normal NECK: No JVD; No carotid  bruits LYMPHATICS: No lymphadenopathy CARDIAC: RRR, no murmurs, no rubs, no gallops RESPIRATORY:  Clear to auscultation without rales, wheezing or rhonchi  ABDOMEN: Soft, non-tender, non-distended MUSCULOSKELETAL:  No edema; No deformity  SKIN: Warm and dry LOWER EXTREMITIES: no swelling NEUROLOGIC:  Alert and oriented x 3 PSYCHIATRIC:  Normal affect   ASSESSMENT:    1. Coronary artery disease of native artery of native heart with stable angina pectoris (HCC)   2. Essential hypertension   3. Dyslipidemia    PLAN:    In order of problems listed above:  1. Coronary artery disease: Doing well from that point of view asymptomatic the case to try to modify her risk factors for coronary artery disease. 2. Essential hypertension: Blood pressure better controlled but still not perfect I think if she can benefit from additional medication I will check Chem-7 first and bradycardia then decide about agent.  She brought blood pressure measurements from home 3. Dyslipidemia: Discussion as above we will put her on Zetia.   Medication Adjustments/Labs and Tests Ordered: Current medicines are reviewed at length with the patient today.  Concerns regarding medicines are outlined above.  No orders of the defined types were placed in this encounter.  Medication changes: No orders of the defined types were placed in this encounter.   Signed, Georgeanna Lea, MD, [facility] 07/27/2017 4:00 PM    [facility]

## 2017-07-27 NOTE — Addendum Note (Signed)
Addended by: Arville Care on: 07/27/2017 04:07 PM   Modules accepted: Orders

## 2017-07-27 NOTE — Patient Instructions (Addendum)
Medication Instructions:  Your physician has recommended you make the following change in your medication:  1) Start Zetia 10 mg daily  Labwork: Your physician recommends that you have lab work today: BMP today to check your kidney function and electolytes  Testing/Procedures: None ordered  Follow-Up: Your physician recommends that you schedule a follow-up appointment in: 1 month with Dr. Bing Matter   Any Other Special Instructions Will Be Listed Below (If Applicable).     If you need a refill on your cardiac medications before your next appointment, please call your pharmacy.

## 2017-07-28 ENCOUNTER — Other Ambulatory Visit: Payer: Self-pay

## 2017-07-28 LAB — BASIC METABOLIC PANEL
BUN/Creatinine Ratio: 9 — ABNORMAL LOW (ref 12–28)
BUN: 7 mg/dL — AB (ref 8–27)
CALCIUM: 9.7 mg/dL (ref 8.7–10.3)
CHLORIDE: 100 mmol/L (ref 96–106)
CO2: 25 mmol/L (ref 20–29)
Creatinine, Ser: 0.81 mg/dL (ref 0.57–1.00)
GFR calc Af Amer: 87 mL/min/{1.73_m2} (ref >59)
GFR calc non Af Amer: 75 mL/min/{1.73_m2} (ref >59)
GLUCOSE: 84 mg/dL (ref 65–99)
Potassium: 4.6 mmol/L (ref 3.5–5.2)
Sodium: 139 mmol/L (ref 134–144)

## 2017-07-29 ENCOUNTER — Other Ambulatory Visit: Payer: Self-pay

## 2017-07-29 MED ORDER — VALSARTAN 160 MG PO TABS: 160.0000 mg | ORAL_TABLET | Freq: Two times a day (BID) | ORAL | 2 refills | Status: DC

## 2017-08-03 ENCOUNTER — Ambulatory Visit (HOSPITAL_BASED_OUTPATIENT_CLINIC_OR_DEPARTMENT_OTHER): Payer: BLUE CROSS/BLUE SHIELD

## 2017-08-06 ENCOUNTER — Ambulatory Visit (HOSPITAL_BASED_OUTPATIENT_CLINIC_OR_DEPARTMENT_OTHER)
Admission: RE | Admit: 2017-08-06 | Discharge: 2017-08-06 | Disposition: A | Discharge status: Home / Self Care | Payer: BLUE CROSS/BLUE SHIELD | Source: Ambulatory Visit | Attending: Cardiology | Admitting: Cardiology

## 2017-08-06 DIAGNOSIS — E785 Hyperlipidemia, unspecified: Secondary | ICD-10-CM

## 2017-08-06 DIAGNOSIS — I6523 Occlusion and stenosis of bilateral carotid arteries: Secondary | ICD-10-CM | POA: Insufficient documentation

## 2017-08-06 DIAGNOSIS — I25118 Atherosclerotic heart disease of native coronary artery with other forms of angina pectoris: Secondary | ICD-10-CM | POA: Diagnosis not present

## 2017-08-20 ENCOUNTER — Telehealth: Payer: Self-pay | Admitting: Cardiology

## 2017-08-20 NOTE — Telephone Encounter (Signed)
Left patient a voice mail

## 2017-08-20 NOTE — Addendum Note (Signed)
Addended by: Craige Cotta on: 08/20/2017 10:08 AM   Modules accepted: Orders

## 2017-08-20 NOTE — Telephone Encounter (Signed)
Kaitlyn Jimenez states Dr. Kirtland Bouchard sent an order for BMP to be done but it needs to be sent to her primary dr. [name] at [phone]. She would like a call once this order has been faxed please.

## 2017-08-27 ENCOUNTER — Ambulatory Visit: Payer: Medicare HMO | Admitting: Cardiology

## 2017-09-03 ENCOUNTER — Ambulatory Visit (INDEPENDENT_AMBULATORY_CARE_PROVIDER_SITE_OTHER): Payer: Medicare HMO | Admitting: Cardiology

## 2017-09-03 ENCOUNTER — Encounter: Payer: Self-pay | Admitting: Cardiology

## 2017-09-03 ENCOUNTER — Other Ambulatory Visit: Payer: Self-pay

## 2017-09-03 VITALS — BP 164/78 | HR 75 | Ht 61.0 in | Wt 166.0 lb

## 2017-09-03 DIAGNOSIS — I10 Essential (primary) hypertension: Secondary | ICD-10-CM

## 2017-09-03 DIAGNOSIS — I25118 Atherosclerotic heart disease of native coronary artery with other forms of angina pectoris: Secondary | ICD-10-CM | POA: Diagnosis not present

## 2017-09-03 DIAGNOSIS — E785 Hyperlipidemia, unspecified: Secondary | ICD-10-CM

## 2017-09-03 NOTE — Addendum Note (Signed)
Addended by: Arville Care on: 09/03/2017 04:58 PM   Modules accepted: Orders

## 2017-09-03 NOTE — Progress Notes (Signed)
Cardiology Office Note:    Date:  09/03/2017   ID:  Kaitlyn Jimenez, DOB 1951/01/18, MRN [id number]  PCP:  Guadalupe Maple., MD  Cardiologist:  Gypsy Balsam, MD    Referring MD: Guadalupe Maple., MD   Chief Complaint  Patient presents with  . Follow-up  Doing well, asymptomatic  History of Present Illness:    Kaitlyn Jimenez is a 67 y.o. female with coronary artery disease hypertension dyslipidemia overall she is doing well denies have any chest pain tightness squeezing pressure burning chest.  She is intolerant to statin I put her on Zetia we will check her cholesterol.  Past Medical History:  Diagnosis Date  . Hypertension   . MI (myocardial infarction) (HCC)   . MI (myocardial infarction) (HCC)   . Thyroid disease     Past Surgical History:  Procedure Laterality Date  . ABDOMINAL HYSTERECTOMY    . CARDIAC CATHETERIZATION    . CHOLECYSTECTOMY      Current Medications: Current Meds  Medication Sig  . ALPRAZolam (XANAX) 1 MG tablet Take 1 mg 2 (two) times daily as needed by mouth for anxiety.  Marland Kitchen aspirin EC 81 MG tablet Take 324 mg daily by mouth.  . cetirizine (ZYRTEC) 10 MG tablet Take 10 mg daily by mouth.  . ezetimibe (ZETIA) 10 MG tablet Take 1 tablet (10 mg total) by mouth daily.  . famotidine (PEPCID) 20 MG tablet Take 20 mg as needed by mouth for heartburn or indigestion.  Marland Kitchen levothyroxine (SYNTHROID, LEVOTHROID) 100 MCG tablet Take 1 tablet daily by mouth.  . metoprolol tartrate (LOPRESSOR) 50 MG tablet Take 50 mg 2 (two) times daily by mouth.  . valsartan (DIOVAN) 160 MG tablet Take 1 tablet (160 mg total) by mouth 2 (two) times daily.     Allergies:   Penciclovir; Shellfish-derived products; Sulfa antibiotics; Contrast media [iodinated diagnostic agents]; Penicillins; and Ramipril   Social History   Socioeconomic History  . Marital status: Married    Spouse name: None  . Number of children: None  . Years of education: None  . Highest education level:  None  Social Needs  . Financial resource strain: None  . Food insecurity - worry: None  . Food insecurity - inability: None  . Transportation needs - medical: None  . Transportation needs - non-medical: None  Occupational History  . None  Tobacco Use  . Smoking status: Former Games developer  . Smokeless tobacco: Never Used  Substance and Sexual Activity  . Alcohol use: No    Frequency: Never  . Drug use: No  . Sexual activity: None  Other Topics Concern  . None  Social History Narrative  . None     Family History: The patient's family history includes Arrhythmia in her sister; Congestive Heart Failure in her brother; Heart attack in her brother and father; Heart disease in her maternal uncle, mother, and paternal uncle; Rheum arthritis in her brother. ROS:   Please see the history of present illness.    All 14 point review of systems negative except as described per history of present illness  EKGs/Labs/Other Studies Reviewed:      Recent Labs: 07/27/2017: BUN 7; Creatinine, Ser 0.81; Potassium 4.6; Sodium 139  Recent Lipid Panel No results found for: CHOL, TRIG, HDL, CHOLHDL, VLDL, LDLCALC, LDLDIRECT  Physical Exam:    VS:  BP (!) 164/78 (BP Location: Right Arm, Patient Position: Sitting, Cuff Size: Large)   Pulse 75   Ht 5\' 1"  (1.549  m)   Wt 166 lb (75.3 kg)   SpO2 97%   BMI 31.37 kg/m     Wt Readings from Last 3 Encounters:  09/03/17 166 lb (75.3 kg)  07/27/17 166 lb 1.9 oz (75.4 kg)  07/13/17 163 lb 12.8 oz (74.3 kg)     GEN:  Well nourished, well developed in no acute distress HEENT: Normal NECK: No JVD; No carotid bruits LYMPHATICS: No lymphadenopathy CARDIAC: RRR, no murmurs, no rubs, no gallops RESPIRATORY:  Clear to auscultation without rales, wheezing or rhonchi  ABDOMEN: Soft, non-tender, non-distended MUSCULOSKELETAL:  No edema; No deformity  SKIN: Warm and dry LOWER EXTREMITIES: no swelling NEUROLOGIC:  Alert and oriented x 3 PSYCHIATRIC:  Normal  affect   ASSESSMENT:    1. Coronary artery disease of native artery of native heart with stable angina pectoris (HCC)   2. Essential hypertension   3. Dyslipidemia    PLAN:    In order of problems listed above:  1. Coronary artery disease: Doing well from that point of view asymptomatic. 2. Pressure elevated today in the office but at home normal she is at 120-130 systolic over 70-80 diastolic. 3. Lipidemia: She is on Zetia which I will continue in 2 weeks from now we will check her fasting lipid profile.  She will go to her primary care physician to have it done   Medication Adjustments/Labs and Tests Ordered: Current medicines are reviewed at length with the patient today.  Concerns regarding medicines are outlined above.  No orders of the defined types were placed in this encounter.  Medication changes: No orders of the defined types were placed in this encounter.   Signed, Georgeanna Lea, MD, [facility] 09/03/2017 4:54 PM    [facility]

## 2017-09-03 NOTE — Patient Instructions (Signed)
Medication Instructions:  Your physician recommends that you continue on your current medications as directed. Please refer to the Current Medication list given to you today.  Labwork: Your physician recommends that you return for lab work at your Primary Care Physician's office, to check your cholesterol in 2 weeks.  Testing/Procedures: None ordered  Follow-Up: Your physician recommends that you schedule a follow-up appointment in: 3 months with Dr. Bing Matter.   Any Other Special Instructions Will Be Listed Below (If Applicable).     If you need a refill on your cardiac medications before your next appointment, please call your pharmacy.

## 2017-09-10 DIAGNOSIS — J111 Influenza due to unidentified influenza virus with other respiratory manifestations: Secondary | ICD-10-CM | POA: Diagnosis not present

## 2017-10-27 DIAGNOSIS — Z01419 Encounter for gynecological examination (general) (routine) without abnormal findings: Secondary | ICD-10-CM | POA: Diagnosis not present

## 2017-10-27 DIAGNOSIS — N819 Female genital prolapse, unspecified: Secondary | ICD-10-CM | POA: Diagnosis not present

## 2017-10-27 DIAGNOSIS — Z1231 Encounter for screening mammogram for malignant neoplasm of breast: Secondary | ICD-10-CM | POA: Diagnosis not present

## 2017-10-27 DIAGNOSIS — R3915 Urgency of urination: Secondary | ICD-10-CM | POA: Diagnosis not present

## 2017-10-27 DIAGNOSIS — Z6831 Body mass index (BMI) 31.0-31.9, adult: Secondary | ICD-10-CM | POA: Diagnosis not present

## 2018-02-15 ENCOUNTER — Other Ambulatory Visit: Payer: Self-pay

## 2018-02-15 MED ORDER — VALSARTAN 160 MG PO TABS: 160.0000 mg | ORAL_TABLET | Freq: Two times a day (BID) | ORAL | 2 refills | Status: DC

## 2018-03-16 DIAGNOSIS — T63441A Toxic effect of venom of bees, accidental (unintentional), initial encounter: Secondary | ICD-10-CM | POA: Diagnosis not present

## 2018-03-16 DIAGNOSIS — E063 Autoimmune thyroiditis: Secondary | ICD-10-CM | POA: Diagnosis not present

## 2018-03-16 DIAGNOSIS — I2510 Atherosclerotic heart disease of native coronary artery without angina pectoris: Secondary | ICD-10-CM | POA: Diagnosis not present

## 2018-03-16 DIAGNOSIS — Z23 Encounter for immunization: Secondary | ICD-10-CM | POA: Diagnosis not present

## 2018-03-16 DIAGNOSIS — F419 Anxiety disorder, unspecified: Secondary | ICD-10-CM | POA: Diagnosis not present

## 2018-03-16 DIAGNOSIS — R7989 Other specified abnormal findings of blood chemistry: Secondary | ICD-10-CM | POA: Diagnosis not present

## 2018-03-16 DIAGNOSIS — I10 Essential (primary) hypertension: Secondary | ICD-10-CM | POA: Diagnosis not present

## 2018-03-16 DIAGNOSIS — E785 Hyperlipidemia, unspecified: Secondary | ICD-10-CM | POA: Diagnosis not present

## 2018-03-16 DIAGNOSIS — E538 Deficiency of other specified B group vitamins: Secondary | ICD-10-CM | POA: Diagnosis not present

## 2018-04-07 ENCOUNTER — Other Ambulatory Visit: Payer: Self-pay | Admitting: Pharmacist

## 2018-04-07 NOTE — Patient Outreach (Signed)
[facility]) Care Management  04/07/2018  [name] 1951-04-18 [id number]   Outreach call to Kaitlyn Jimenez regarding her request for follow up from the [facility] Medication Adherence Campaign.  Reports that she takes her valsartan 160 mg twice daily as directed. However, reports that she monitors her own blood pressure once or twice daily and if her blood pressure is below 110/50, she does not take the evening dose of valsartan. Reports that her Cardiologist is aware of this. Reports that her blood pressure on most days has been running between 125-130/60-70 with a pulse between 50-60 bpm. Reports that she has been under a lot of stress over the past few months as her son was in an accident and that she has not scheduled a follow up visit with her Cardiologist. Discuss with patient the importance of blood pressure control and counsel patient to schedule this follow up visit. Encourage patient to bring her blood pressure log with her to this visit. Ms. Kaitlyn Jimenez understanding and states that she will schedule this follow up visit.  PLAN  Will close pharmacy episode at this time.  Duanne Moron, PharmD, [facility] Clinical Pharmacist [facility] Care Management [phone]

## 2018-04-12 DIAGNOSIS — R1310 Dysphagia, unspecified: Secondary | ICD-10-CM | POA: Diagnosis not present

## 2018-04-12 DIAGNOSIS — K921 Melena: Secondary | ICD-10-CM | POA: Diagnosis not present

## 2018-04-12 DIAGNOSIS — K219 Gastro-esophageal reflux disease without esophagitis: Secondary | ICD-10-CM | POA: Diagnosis not present

## 2018-05-04 DIAGNOSIS — Z1211 Encounter for screening for malignant neoplasm of colon: Secondary | ICD-10-CM | POA: Diagnosis not present

## 2018-05-04 DIAGNOSIS — B9681 Helicobacter pylori [H. pylori] as the cause of diseases classified elsewhere: Secondary | ICD-10-CM | POA: Diagnosis not present

## 2018-05-04 DIAGNOSIS — K644 Residual hemorrhoidal skin tags: Secondary | ICD-10-CM | POA: Diagnosis not present

## 2018-05-04 DIAGNOSIS — K219 Gastro-esophageal reflux disease without esophagitis: Secondary | ICD-10-CM | POA: Diagnosis not present

## 2018-05-04 DIAGNOSIS — K210 Gastro-esophageal reflux disease with esophagitis: Secondary | ICD-10-CM | POA: Diagnosis not present

## 2018-05-04 DIAGNOSIS — K5730 Diverticulosis of large intestine without perforation or abscess without bleeding: Secondary | ICD-10-CM | POA: Diagnosis not present

## 2018-05-04 DIAGNOSIS — R1310 Dysphagia, unspecified: Secondary | ICD-10-CM | POA: Diagnosis not present

## 2018-05-04 DIAGNOSIS — K449 Diaphragmatic hernia without obstruction or gangrene: Secondary | ICD-10-CM | POA: Diagnosis not present

## 2018-05-04 DIAGNOSIS — N816 Rectocele: Secondary | ICD-10-CM | POA: Diagnosis not present

## 2018-05-04 DIAGNOSIS — K222 Esophageal obstruction: Secondary | ICD-10-CM | POA: Diagnosis not present

## 2018-05-04 DIAGNOSIS — K648 Other hemorrhoids: Secondary | ICD-10-CM | POA: Diagnosis not present

## 2018-05-04 DIAGNOSIS — K208 Other esophagitis: Secondary | ICD-10-CM | POA: Diagnosis not present

## 2018-06-11 ENCOUNTER — Ambulatory Visit (INDEPENDENT_AMBULATORY_CARE_PROVIDER_SITE_OTHER): Payer: Medicare HMO | Admitting: Cardiology

## 2018-06-11 ENCOUNTER — Encounter: Payer: Self-pay | Admitting: Cardiology

## 2018-06-11 VITALS — BP 134/82 | HR 68 | Ht 61.0 in | Wt 176.8 lb

## 2018-06-11 DIAGNOSIS — R002 Palpitations: Secondary | ICD-10-CM

## 2018-06-11 DIAGNOSIS — E785 Hyperlipidemia, unspecified: Secondary | ICD-10-CM

## 2018-06-11 DIAGNOSIS — I25118 Atherosclerotic heart disease of native coronary artery with other forms of angina pectoris: Secondary | ICD-10-CM | POA: Diagnosis not present

## 2018-06-11 DIAGNOSIS — I10 Essential (primary) hypertension: Secondary | ICD-10-CM

## 2018-06-11 NOTE — Patient Instructions (Signed)
Medication Instructions:  Your physician recommends that you continue on your current medications as directed. Please refer to the Current Medication list given to you today.  If you need a refill on your cardiac medications before your next appointment, please call your pharmacy.   Lab work: None ordered If you have labs (blood work) drawn today and your tests are completely normal, you will receive your results only by: Marland Kitchen MyChart Message (if you have MyChart) OR . A paper copy in the mail If you have any lab test that is abnormal or we need to change your treatment, we will call you to review the results.  Testing/Procedures: Your physician has recommended that you wear a holter monitor. Holter monitors are medical devices that record the heart's electrical activity. Doctors most often use these monitors to diagnose arrhythmias. Arrhythmias are problems with the speed or rhythm of the heartbeat. The monitor is a small, portable device. You can wear one while you do your normal daily activities. This is usually used to diagnose what is causing palpitations/syncope (passing out). You will wear this for 24 hours.  Follow-Up: At [facility], you and your health needs are our priority.  As part of our continuing mission to provide you with exceptional heart care, we have created designated Provider Care Teams.  These Care Teams include your primary Cardiologist (physician) and Advanced Practice Providers (APPs -  Physician Assistants and Nurse Practitioners) who all work together to provide you with the care you need, when you need it. You will need a follow up appointment in 3 months.  Please call our office 2 months in advance to schedule this appointment.  You may see Gypsy Balsam, MD or another member of our [facility] Provider Team in [place]: Norman Herrlich, MD . Belva Crome, MD  Any Other Special Instructions Will Be Listed Below (If Applicable).

## 2018-06-11 NOTE — Progress Notes (Signed)
Cardiology Office Note:    Date:  06/11/2018   ID:  Kaitlyn Jimenez, DOB Apr 03, 1951, MRN [id number]  PCP:  Guadalupe Maple., MD  Cardiologist:  Gypsy Balsam, MD    Referring MD: Guadalupe Maple., MD   Chief Complaint  Patient presents with  . Follow-up  Doing well cardiac wise  History of Present Illness:    Kaitlyn Jimenez is a 67 y.o. female with coronary artery disease status post microinfarction cardiac wise appears to be doing well she does have a problem with GI tract.  She is being followed by GI team she was recently diagnosed with H. pylori antibiotics were given however she was not able to tolerate this.  Also described to have some palpitations when she feel her heart skipping.  No chest pain tightness squeezing pressure burning chest  Past Medical History:  Diagnosis Date  . Hypertension   . MI (myocardial infarction) (HCC)   . MI (myocardial infarction) (HCC)   . Thyroid disease     Past Surgical History:  Procedure Laterality Date  . ABDOMINAL HYSTERECTOMY    . CARDIAC CATHETERIZATION    . CHOLECYSTECTOMY      Current Medications: Current Meds  Medication Sig  . ALPRAZolam (XANAX) 1 MG tablet Take 1 mg 2 (two) times daily as needed by mouth for anxiety.  Marland Kitchen aspirin EC 81 MG tablet Take 324 mg daily by mouth.  . cetirizine (ZYRTEC) 10 MG tablet Take 10 mg daily by mouth.  . EPINEPHrine 0.3 mg/0.3 mL IJ SOAJ injection Inject 0.3 mg once into the muscle.  . famotidine (PEPCID) 20 MG tablet Take 20 mg as needed by mouth for heartburn or indigestion.  Marland Kitchen levothyroxine (SYNTHROID, LEVOTHROID) 100 MCG tablet Take 1 tablet daily by mouth.  . metoprolol tartrate (LOPRESSOR) 50 MG tablet Take 50 mg 2 (two) times daily by mouth.  . nitroGLYCERIN (NITROSTAT) 0.4 MG SL tablet Place 0.4 mg under the tongue every 5 (five) minutes as needed for chest pain.  Marland Kitchen omeprazole (PRILOSEC) 20 MG capsule Take 1 capsule by mouth daily.  . valsartan (DIOVAN) 160 MG tablet Take 1  tablet (160 mg total) by mouth 2 (two) times daily.  . [DISCONTINUED] ezetimibe (ZETIA) 10 MG tablet Take 1 tablet (10 mg total) by mouth daily.     Allergies:   Penciclovir; Shellfish-derived products; Sulfa antibiotics; Contrast media [iodinated diagnostic agents]; Penicillins; and Ramipril   Social History   Socioeconomic History  . Marital status: Married    Spouse name: Not on file  . Number of children: Not on file  . Years of education: Not on file  . Highest education level: Not on file  Occupational History  . Not on file  Social Needs  . Financial resource strain: Not on file  . Food insecurity:    Worry: Not on file    Inability: Not on file  . Transportation needs:    Medical: Not on file    Non-medical: Not on file  Tobacco Use  . Smoking status: Former Games developer  . Smokeless tobacco: Never Used  Substance and Sexual Activity  . Alcohol use: No    Frequency: Never  . Drug use: No  . Sexual activity: Not on file  Lifestyle  . Physical activity:    Days per week: Not on file    Minutes per session: Not on file  . Stress: Not on file  Relationships  . Social connections:    Talks on phone: Not  on file    Gets together: Not on file    Attends religious service: Not on file    Active member of club or organization: Not on file    Attends meetings of clubs or organizations: Not on file    Relationship status: Not on file  Other Topics Concern  . Not on file  Social History Narrative  . Not on file     Family History: The patient's family history includes Arrhythmia in her sister; Congestive Heart Failure in her brother; Heart attack in her brother and father; Heart disease in her maternal uncle, mother, and paternal uncle; Rheum arthritis in her brother. ROS:   Please see the history of present illness.    All 14 point review of systems negative except as described per history of present illness  EKGs/Labs/Other Studies Reviewed:      Recent  Labs: 07/27/2017: BUN 7; Creatinine, Ser 0.81; Potassium 4.6; Sodium 139  Recent Lipid Panel No results found for: CHOL, TRIG, HDL, CHOLHDL, VLDL, LDLCALC, LDLDIRECT  Physical Exam:    VS:  BP 134/82   Pulse 68   Ht 5\' 1"  (1.549 m)   Wt 176 lb 12.8 oz (80.2 kg)   SpO2 95%   BMI 33.41 kg/m     Wt Readings from Last 3 Encounters:  06/11/18 176 lb 12.8 oz (80.2 kg)  09/03/17 166 lb (75.3 kg)  07/27/17 166 lb 1.9 oz (75.4 kg)     GEN:  Well nourished, well developed in no acute distress HEENT: Normal NECK: No JVD; No carotid bruits LYMPHATICS: No lymphadenopathy CARDIAC: RRR, no murmurs, no rubs, no gallops RESPIRATORY:  Clear to auscultation without rales, wheezing or rhonchi  ABDOMEN: Soft, non-tender, non-distended MUSCULOSKELETAL:  No edema; No deformity  SKIN: Warm and dry LOWER EXTREMITIES: no swelling NEUROLOGIC:  Alert and oriented x 3 PSYCHIATRIC:  Normal affect   ASSESSMENT:    1. Coronary artery disease of native artery of native heart with stable angina pectoris (HCC)   2. Essential hypertension   3. Dyslipidemia   4. Palpitations    PLAN:    In order of problems listed above:  1. Coronary artery disease stable on appropriate medications which I will continue. 2. Essential hypertension blood pressure well controlled continue present management 3. Dyslipidemia she had difficulty tolerating statins also difficulty tolerating Zetia I told her that she needs to have her stomach straight and up and then we talked about potentially injectable medication. 4. Palpitations asked her to wear Holter monitor for 24 hours.   Medication Adjustments/Labs and Tests Ordered: Current medicines are reviewed at length with the patient today.  Concerns regarding medicines are outlined above.  Orders Placed This Encounter  Procedures  . HOLTER MONITOR - 24 HOUR   Medication changes: No orders of the defined types were placed in this encounter.   Signed, Georgeanna Lea, MD, [facility] 06/11/2018 11:34 AM    [facility]

## 2018-06-14 ENCOUNTER — Ambulatory Visit (INDEPENDENT_AMBULATORY_CARE_PROVIDER_SITE_OTHER): Payer: Medicare HMO

## 2018-06-14 DIAGNOSIS — K222 Esophageal obstruction: Secondary | ICD-10-CM | POA: Diagnosis not present

## 2018-06-14 DIAGNOSIS — K5730 Diverticulosis of large intestine without perforation or abscess without bleeding: Secondary | ICD-10-CM | POA: Diagnosis not present

## 2018-06-14 DIAGNOSIS — K219 Gastro-esophageal reflux disease without esophagitis: Secondary | ICD-10-CM | POA: Diagnosis not present

## 2018-06-14 DIAGNOSIS — R1310 Dysphagia, unspecified: Secondary | ICD-10-CM | POA: Diagnosis not present

## 2018-06-14 DIAGNOSIS — R002 Palpitations: Secondary | ICD-10-CM

## 2018-06-28 DIAGNOSIS — R1310 Dysphagia, unspecified: Secondary | ICD-10-CM | POA: Diagnosis not present

## 2018-06-28 DIAGNOSIS — K222 Esophageal obstruction: Secondary | ICD-10-CM | POA: Diagnosis not present

## 2018-09-02 DIAGNOSIS — K219 Gastro-esophageal reflux disease without esophagitis: Secondary | ICD-10-CM | POA: Diagnosis not present

## 2018-09-02 DIAGNOSIS — B9681 Helicobacter pylori [H. pylori] as the cause of diseases classified elsewhere: Secondary | ICD-10-CM | POA: Diagnosis not present

## 2018-09-06 DIAGNOSIS — A048 Other specified bacterial intestinal infections: Secondary | ICD-10-CM | POA: Diagnosis not present

## 2018-09-16 ENCOUNTER — Ambulatory Visit: Payer: Medicare HMO | Admitting: Cardiology

## 2018-09-16 DIAGNOSIS — Z1331 Encounter for screening for depression: Secondary | ICD-10-CM | POA: Diagnosis not present

## 2018-09-16 DIAGNOSIS — F419 Anxiety disorder, unspecified: Secondary | ICD-10-CM | POA: Diagnosis not present

## 2018-09-16 DIAGNOSIS — E785 Hyperlipidemia, unspecified: Secondary | ICD-10-CM | POA: Diagnosis not present

## 2018-09-16 DIAGNOSIS — I10 Essential (primary) hypertension: Secondary | ICD-10-CM | POA: Diagnosis not present

## 2018-09-16 DIAGNOSIS — Z79899 Other long term (current) drug therapy: Secondary | ICD-10-CM | POA: Diagnosis not present

## 2018-09-16 DIAGNOSIS — E063 Autoimmune thyroiditis: Secondary | ICD-10-CM | POA: Diagnosis not present

## 2018-09-16 DIAGNOSIS — E538 Deficiency of other specified B group vitamins: Secondary | ICD-10-CM | POA: Diagnosis not present

## 2018-09-16 DIAGNOSIS — R7989 Other specified abnormal findings of blood chemistry: Secondary | ICD-10-CM | POA: Diagnosis not present

## 2018-09-16 DIAGNOSIS — L309 Dermatitis, unspecified: Secondary | ICD-10-CM | POA: Diagnosis not present

## 2018-09-16 DIAGNOSIS — Z9181 History of falling: Secondary | ICD-10-CM | POA: Diagnosis not present

## 2018-09-16 DIAGNOSIS — R739 Hyperglycemia, unspecified: Secondary | ICD-10-CM | POA: Diagnosis not present

## 2018-09-26 DIAGNOSIS — N3000 Acute cystitis without hematuria: Secondary | ICD-10-CM | POA: Diagnosis not present

## 2018-09-26 DIAGNOSIS — R300 Dysuria: Secondary | ICD-10-CM | POA: Diagnosis not present

## 2018-10-08 ENCOUNTER — Ambulatory Visit (INDEPENDENT_AMBULATORY_CARE_PROVIDER_SITE_OTHER): Payer: Medicare HMO | Admitting: Cardiology

## 2018-10-08 ENCOUNTER — Encounter: Payer: Self-pay | Admitting: Cardiology

## 2018-10-08 VITALS — BP 140/70 | HR 78 | Ht 61.0 in | Wt 174.6 lb

## 2018-10-08 DIAGNOSIS — E785 Hyperlipidemia, unspecified: Secondary | ICD-10-CM | POA: Diagnosis not present

## 2018-10-08 DIAGNOSIS — I10 Essential (primary) hypertension: Secondary | ICD-10-CM | POA: Diagnosis not present

## 2018-10-08 DIAGNOSIS — I25118 Atherosclerotic heart disease of native coronary artery with other forms of angina pectoris: Secondary | ICD-10-CM | POA: Diagnosis not present

## 2018-10-08 DIAGNOSIS — I471 Supraventricular tachycardia: Secondary | ICD-10-CM | POA: Diagnosis not present

## 2018-10-08 MED ORDER — DILTIAZEM HCL ER COATED BEADS 120 MG PO CP24: 120.0000 mg | ORAL_CAPSULE | Freq: Every day | ORAL | 1 refills | Status: DC

## 2018-10-08 NOTE — Patient Instructions (Signed)
Medication Instructions:  Your physician has recommended you make the following change in your medication:   Start: Cardizem 120 mg daily   If you need a refill on your cardiac medications before your next appointment, please call your pharmacy.   Lab work: None.  If you have labs (blood work) drawn today and your tests are completely normal, you will receive your results only by: Marland Kitchen MyChart Message (if you have MyChart) OR . A paper copy in the mail If you have any lab test that is abnormal or we need to change your treatment, we will call you to review the results.  Testing/Procedures: None.   Follow-Up: At [facility], you and your health needs are our priority.  As part of our continuing mission to provide you with exceptional heart care, we have created designated Provider Care Teams.  These Care Teams include your primary Cardiologist (physician) and Advanced Practice Providers (APPs -  Physician Assistants and Nurse Practitioners) who all work together to provide you with the care you need, when you need it. You will need a follow up appointment in 3 months.  Please call our office 2 months in advance to schedule this appointment.  You may see Gypsy Balsam, MD or another member of our [facility] HeartCare Provider Team in [place]: Norman Herrlich, MD . Belva Crome, MD  Any Other Special Instructions Will Be Listed Below (If Applicable).  Dr. Bing Matter has referred you to the lipid clinic, they should call you within one week for an appointment. Please call our office if not.   Diltiazem tablets What is this medicine? DILTIAZEM (dil TYE a zem) is a calcium-channel blocker. It affects the amount of calcium found in your heart and muscle cells. This relaxes your blood vessels, which can reduce the amount of work the heart has to do. This medicine is used to treat chest pain caused by angina. This medicine may be used for other purposes; ask your health care provider or pharmacist  if you have questions. COMMON BRAND NAME(S): Cardizem What should I tell my health care provider before I take this medicine? They need to know if you have any of these conditions: -heart problems, low blood pressure, irregular heartbeat -liver disease -previous heart attack -an unusual or allergic reaction to diltiazem, other medicines, foods, dyes, or preservatives -pregnant or trying to get pregnant -breast-feeding How should I use this medicine? Take this medicine by mouth with a glass of water. Follow the directions on the prescription label. Do not cut, crush or chew this medicine. This medicine is usually taken before meals and at bedtime. Take your doses at regular intervals. Do not take your medicine more often then directed. Do not stop taking except on the advice of your doctor or health care professional. Talk to your pediatrician regarding the use of this medicine in children. Special care may be needed. Overdosage: If you think you have taken too much of this medicine contact a poison control center or emergency room at once. NOTE: This medicine is only for you. Do not share this medicine with others. What if I miss a dose? If you miss a dose, take it as soon as you can. If it is almost time for your next dose, take only that dose. Do not take double or extra doses. What may interact with this medicine? Do not take this medicine with any of the following: -cisapride -hawthorn -pimozide -ranolazine -red yeast rice This medicine may also interact with the following medications: -buspirone -  carbamazepine -cimetidine -cyclosporine -digoxin -local anesthetics or general anesthetics -lovastatin -medicines for anxiety or difficulty sleeping like midazolam and triazolam -medicines for high blood pressure or heart problems -quinidine -rifampin, rifabutin, or rifapentine This list may not describe all possible interactions. Give your health care provider a list of all the  medicines, herbs, non-prescription drugs, or dietary supplements you use. Also tell them if you smoke, drink alcohol, or use illegal drugs. Some items may interact with your medicine. What should I watch for while using this medicine? Check your blood pressure and pulse rate regularly. Ask your doctor or health care professional what your blood pressure and pulse rate should be and when you should contact him or her. You may feel dizzy or lightheaded. Do not drive, use machinery, or do anything that needs mental alertness until you know how this medicine affects you. To reduce the risk of dizzy or fainting spells, do not sit or stand up quickly, especially if you are an older patient. Alcohol can make you more dizzy or increase flushing and rapid heartbeats. Avoid alcoholic drinks. What side effects may I notice from receiving this medicine? Side effects that you should report to your doctor or health care professional as soon as possible: -allergic reactions like skin rash, itching or hives, swelling of the face, lips, or tongue -confusion, mental depression -feeling faint or lightheaded, falls -pinpoint red spots on the skin -redness, blistering, peeling or loosening of the skin, including inside the mouth -slow, irregular heartbeat -swelling of the ankles, feet -unusual bleeding or bruising Side effects that usually do not require medical attention (report to your doctor or health care professional if they continue or are bothersome): -change in sex drive or performance -constipation or diarrhea -flushing of the face -headache -nausea, vomiting -tired or weak -trouble sleeping This list may not describe all possible side effects. Call your doctor for medical advice about side effects. You may report side effects to FDA at 1-800-FDA-1088. Where should I keep my medicine? Keep out of the reach of children. Store at room temperature between 20 and 25 degrees C (68 and 77 degrees F). Protect  from light. Keep container tightly closed. Throw away any unused medicine after the expiration date. NOTE: This sheet is a summary. It may not cover all possible information. If you have questions about this medicine, talk to your doctor, pharmacist, or health care provider.  2019 Elsevier/Gold Standard (2013-07-25 10:54:31)

## 2018-10-08 NOTE — Progress Notes (Signed)
Cardiology Office Note:    Date:  10/08/2018   ID:  Kaitlyn Jimenez, DOB 04-30-1951, MRN [id number]  PCP:  Guadalupe Maple., MD  Cardiologist:  Gypsy Balsam, MD    Referring MD: Guadalupe Maple., MD   Chief Complaint  Patient presents with  . Follow-up  Doing well  History of Present Illness:    Kaitlyn Jimenez is a 68 y.o. female with coronary artery disease multiple stenting in 2007 2013 last cardiac catheterization 2015 only 30% narrowing comes today to my office for follow-up to discuss her risk factors he she also complained of having palpitations monitor that she wear showed supraventricular tachycardia she is already on metoprolol 50 mg twice daily I will check her EKGs EKG is fine we will add Cardizem CD 120.  She also got significant problems dyslipidemia her last LDL in 150s she does not want to take statin because of intolerance to it I repeatedly talked to her about newer agent PCSK9 and finally she agreed to at least explore what would be the price for this medication.  We will try to do that.  Past Medical History:  Diagnosis Date  . Hypertension   . MI (myocardial infarction) (HCC)   . MI (myocardial infarction) (HCC)   . Thyroid disease     Past Surgical History:  Procedure Laterality Date  . ABDOMINAL HYSTERECTOMY    . CARDIAC CATHETERIZATION    . CHOLECYSTECTOMY      Current Medications: Current Meds  Medication Sig  . ALPRAZolam (XANAX) 1 MG tablet Take 1 mg 2 (two) times daily as needed by mouth for anxiety.  Marland Kitchen aspirin EC 81 MG tablet Take 324 mg daily by mouth.  . cetirizine (ZYRTEC) 10 MG tablet Take 10 mg daily by mouth.  . EPINEPHrine 0.3 mg/0.3 mL IJ SOAJ injection Inject 0.3 mg once into the muscle.  . famotidine (PEPCID) 20 MG tablet Take 20 mg as needed by mouth for heartburn or indigestion.  Marland Kitchen levothyroxine (SYNTHROID, LEVOTHROID) 100 MCG tablet Take 1 tablet daily by mouth.  . metoprolol tartrate (LOPRESSOR) 50 MG tablet Take 50 mg 2 (two)  times daily by mouth.  . nitroGLYCERIN (NITROSTAT) 0.4 MG SL tablet Place 0.4 mg under the tongue every 5 (five) minutes as needed for chest pain.  . valsartan (DIOVAN) 160 MG tablet Take 1 tablet (160 mg total) by mouth 2 (two) times daily.     Allergies:   Penciclovir; Shellfish-derived products; Sulfa antibiotics; Contrast media [iodinated diagnostic agents]; Penicillins; and Ramipril   Social History   Socioeconomic History  . Marital status: Married    Spouse name: Not on file  . Number of children: Not on file  . Years of education: Not on file  . Highest education level: Not on file  Occupational History  . Not on file  Social Needs  . Financial resource strain: Not on file  . Food insecurity:    Worry: Not on file    Inability: Not on file  . Transportation needs:    Medical: Not on file    Non-medical: Not on file  Tobacco Use  . Smoking status: Former Games developer  . Smokeless tobacco: Never Used  Substance and Sexual Activity  . Alcohol use: No    Frequency: Never  . Drug use: No  . Sexual activity: Not on file  Lifestyle  . Physical activity:    Days per week: Not on file    Minutes per session: Not on file  .  Stress: Not on file  Relationships  . Social connections:    Talks on phone: Not on file    Gets together: Not on file    Attends religious service: Not on file    Active member of club or organization: Not on file    Attends meetings of clubs or organizations: Not on file    Relationship status: Not on file  Other Topics Concern  . Not on file  Social History Narrative  . Not on file     Family History: The patient's family history includes Arrhythmia in her sister; Congestive Heart Failure in her brother; Heart attack in her brother and father; Heart disease in her maternal uncle, mother, and paternal uncle; Rheum arthritis in her brother. ROS:   Please see the history of present illness.    All 14 point review of systems negative except as  described per history of present illness  EKGs/Labs/Other Studies Reviewed:      Recent Labs: No results found for requested labs within last 8760 hours.  Recent Lipid Panel No results found for: CHOL, TRIG, HDL, CHOLHDL, VLDL, LDLCALC, LDLDIRECT  Physical Exam:    VS:  BP 140/70   Pulse 78   Ht 5\' 1"  (1.549 m)   Wt 174 lb 9.6 oz (79.2 kg)   SpO2 97%   BMI 32.99 kg/m     Wt Readings from Last 3 Encounters:  10/08/18 174 lb 9.6 oz (79.2 kg)  06/11/18 176 lb 12.8 oz (80.2 kg)  09/03/17 166 lb (75.3 kg)     GEN:  Well nourished, well developed in no acute distress HEENT: Normal NECK: No JVD; No carotid bruits LYMPHATICS: No lymphadenopathy CARDIAC: RRR, no murmurs, no rubs, no gallops RESPIRATORY:  Clear to auscultation without rales, wheezing or rhonchi  ABDOMEN: Soft, non-tender, non-distended MUSCULOSKELETAL:  No edema; No deformity  SKIN: Warm and dry LOWER EXTREMITIES: no swelling NEUROLOGIC:  Alert and oriented x 3 PSYCHIATRIC:  Normal affect   ASSESSMENT:    1. Coronary artery disease of native artery of native heart with stable angina pectoris (HCC)   2. Dyslipidemia   3. Essential hypertension    PLAN:    In order of problems listed above:  1. Coronary disease doing well from that point he denies have any chest pain tightness squeezing pressure burning chest. 2. Dyslipidemia will enroll her at lipid clinic for consideration of PCSK9 agent. 3. Essential hypertension blood pressure well controlled continue present medications. 4. She does have H. pylori which been partially treated but not completely.  Follow-up by GI team   Medication Adjustments/Labs and Tests Ordered: Current medicines are reviewed at length with the patient today.  Concerns regarding medicines are outlined above.  No orders of the defined types were placed in this encounter.  Medication changes: No orders of the defined types were placed in this encounter.   Signed, Georgeanna Lea, MD, [facility] 10/08/2018 3:42 PM    [facility]

## 2018-10-08 NOTE — Addendum Note (Signed)
Addended by: Lita Mains on: 10/08/2018 03:51 PM   Modules accepted: Orders

## 2018-10-21 DIAGNOSIS — H5213 Myopia, bilateral: Secondary | ICD-10-CM | POA: Diagnosis not present

## 2018-10-21 DIAGNOSIS — H524 Presbyopia: Secondary | ICD-10-CM | POA: Diagnosis not present

## 2018-11-01 ENCOUNTER — Other Ambulatory Visit: Payer: Self-pay | Admitting: Cardiology

## 2018-12-21 DIAGNOSIS — E785 Hyperlipidemia, unspecified: Secondary | ICD-10-CM | POA: Diagnosis not present

## 2018-12-21 DIAGNOSIS — I10 Essential (primary) hypertension: Secondary | ICD-10-CM | POA: Diagnosis not present

## 2018-12-21 DIAGNOSIS — I2510 Atherosclerotic heart disease of native coronary artery without angina pectoris: Secondary | ICD-10-CM | POA: Diagnosis not present

## 2018-12-21 DIAGNOSIS — T63441A Toxic effect of venom of bees, accidental (unintentional), initial encounter: Secondary | ICD-10-CM | POA: Diagnosis not present

## 2018-12-21 DIAGNOSIS — E538 Deficiency of other specified B group vitamins: Secondary | ICD-10-CM | POA: Diagnosis not present

## 2018-12-21 DIAGNOSIS — E063 Autoimmune thyroiditis: Secondary | ICD-10-CM | POA: Diagnosis not present

## 2018-12-21 DIAGNOSIS — F419 Anxiety disorder, unspecified: Secondary | ICD-10-CM | POA: Diagnosis not present

## 2018-12-21 DIAGNOSIS — E559 Vitamin D deficiency, unspecified: Secondary | ICD-10-CM | POA: Diagnosis not present

## 2018-12-21 DIAGNOSIS — K210 Gastro-esophageal reflux disease with esophagitis: Secondary | ICD-10-CM | POA: Diagnosis not present

## 2018-12-24 ENCOUNTER — Telehealth: Payer: Self-pay | Admitting: Internal Medicine

## 2018-12-24 NOTE — Telephone Encounter (Signed)
LMTCB to change appt to virtual visit with Dr. Rennis Golden, lipid clinic.

## 2018-12-28 ENCOUNTER — Ambulatory Visit: Payer: Medicare HMO | Admitting: Internal Medicine

## 2018-12-28 ENCOUNTER — Telehealth: Payer: Self-pay | Admitting: Cardiology

## 2018-12-28 NOTE — Telephone Encounter (Signed)
Virtual Visit Pre-Appointment Phone Call  "(Name), I am calling you today to discuss your upcoming appointment. We are currently trying to limit exposure to the virus that causes COVID-19 by seeing patients at home rather than in the office."  1. "What is the BEST phone number to call the day of the visit?" - include this in appointment notes  2. Do you have or have access to (through a family member/friend) a smartphone with video capability that we can use for your visit?" a. If yes - list this number in appt notes as cell (if different from BEST phone #) and list the appointment type as a VIDEO visit in appointment notes b. If no - list the appointment type as a PHONE visit in appointment notes  3. Confirm consent - "In the setting of the current Covid19 crisis, you are scheduled for a (phone or video) visit with your provider on (date) at (time).  Just as we do with many in-office visits, in order for you to participate in this visit, we must obtain consent.  If you'd like, I can send this to your mychart (if signed up) or email for you to review.  Otherwise, I can obtain your verbal consent now.  All virtual visits are billed to your insurance company just like a normal visit would be.  By agreeing to a virtual visit, we'd like you to understand that the technology does not allow for your provider to perform an examination, and thus may limit your provider's ability to fully assess your condition. If your provider identifies any concerns that need to be evaluated in person, we will make arrangements to do so.  Finally, though the technology is pretty good, we cannot assure that it will always work on either your or our end, and in the setting of a video visit, we may have to convert it to a phone-only visit.  In either situation, we cannot ensure that we have a secure connection.  Are you willing to proceed?" STAFF: Did the patient verbally acknowledge consent to telehealth visit? Document  YES/NO here: YES  4. Advise patient to be prepared - "Two hours prior to your appointment, go ahead and check your blood pressure, pulse, oxygen saturation, and your weight (if you have the equipment to check those) and write them all down. When your visit starts, your provider will ask you for this information. If you have an Apple Watch or Kardia device, please plan to have heart rate information ready on the day of your appointment. Please have a pen and paper handy nearby the day of the visit as well."  5. Give patient instructions for MyChart download to smartphone OR Doximity/Doxy.me as below if video visit (depending on what platform provider is using)  6. Inform patient they will receive a phone call 15 minutes prior to their appointment time (may be from unknown caller ID) so they should be prepared to answer    TELEPHONE CALL NOTE  Kaitlyn Jimenez has been deemed a candidate for a follow-up tele-health visit to limit community exposure during the Covid-19 pandemic. I spoke with the patient via phone to ensure availability of phone/video source, confirm preferred email & phone number, and discuss instructions and expectations.  I reminded Kaitlyn Jimenez to be prepared with any vital sign and/or heart rhythm information that could potentially be obtained via home monitoring, at the time of her visit. I reminded Kaitlyn Jimenez to expect a phone call prior to  her visit.  Kaitlyn Jimenez 12/28/2018 10:24 AM   INSTRUCTIONS FOR DOWNLOADING THE MYCHART APP TO SMARTPHONE  - The patient must first make sure to have activated MyChart and know their login information - If Apple, go to [employer] and type in MyChart in the search bar and download the app. If Android, ask patient to go to [employer] and type in [place] in the search bar and download the app. The app is free but as with any other app downloads, their phone may require them to verify saved payment information or  Apple/Android password.  - The patient will need to then log into the app with their MyChart username and password, and select [facility] as their healthcare provider to link the account. When it is time for your visit, go to the MyChart app, find appointments, and click Begin Video Visit. Be sure to Select Allow for your device to access the Microphone and Camera for your visit. You will then be connected, and your provider will be with you shortly.  **If they have any issues connecting, or need assistance please contact MyChart service desk [phone])**  **If using a computer, in order to ensure the best quality for their visit they will need to use either of the following Internet Browsers: [employer] or Google Chrome**  IF USING DOXIMITY or DOXY.ME - The patient will receive a link just prior to their visit by text.     FULL LENGTH CONSENT FOR TELE-HEALTH VISIT   I hereby voluntarily request, consent and authorize [facility] and its employed or contracted physicians, physician assistants, nurse practitioners or other licensed health care professionals (the Practitioner), to provide me with telemedicine health care services (the Services") as deemed necessary by the treating Practitioner. I acknowledge and consent to receive the Services by the Practitioner via telemedicine. I understand that the telemedicine visit will involve communicating with the Practitioner through live audiovisual communication technology and the disclosure of certain medical information by electronic transmission. I acknowledge that I have been given the opportunity to request an in-person assessment or other available alternative prior to the telemedicine visit and am voluntarily participating in the telemedicine visit.  I understand that I have the right to withhold or withdraw my consent to the use of telemedicine in the course of my care at any time, without affecting my right to future care  or treatment, and that the Practitioner or I may terminate the telemedicine visit at any time. I understand that I have the right to inspect all information obtained and/or recorded in the course of the telemedicine visit and may receive copies of available information for a reasonable fee.  I understand that some of the potential risks of receiving the Services via telemedicine include:   Delay or interruption in medical evaluation due to technological equipment failure or disruption;  Information transmitted may not be sufficient (e.g. poor resolution of images) to allow for appropriate medical decision making by the Practitioner; and/or   In rare instances, security protocols could fail, causing a breach of personal health information.  Furthermore, I acknowledge that it is my responsibility to provide information about my medical history, conditions and care that is complete and accurate to the best of my ability. I acknowledge that Practitioner's advice, recommendations, and/or decision may be based on factors not within their control, such as incomplete or inaccurate data provided by me or distortions of diagnostic images or specimens that may result from electronic transmissions. I  understand that the practice of medicine is not an exact science and that Practitioner makes no warranties or guarantees regarding treatment outcomes. I acknowledge that I will receive a copy of this consent concurrently upon execution via email to the email address I last provided but may also request a printed copy by calling the office of [place].    I understand that my insurance will be billed for this visit.   I have read or had this consent read to me.  I understand the contents of this consent, which adequately explains the benefits and risks of the Services being provided via telemedicine.   I have been provided ample opportunity to ask questions regarding this consent and the Services and have had  my questions answered to my satisfaction.  I give my informed consent for the services to be provided through the use of telemedicine in my medical care  By participating in this telemedicine visit I agree to the above.

## 2019-01-06 ENCOUNTER — Telehealth (INDEPENDENT_AMBULATORY_CARE_PROVIDER_SITE_OTHER): Payer: Medicare HMO | Admitting: Cardiology

## 2019-01-06 ENCOUNTER — Telehealth: Payer: Medicare HMO | Admitting: Cardiology

## 2019-01-06 ENCOUNTER — Other Ambulatory Visit: Payer: Self-pay

## 2019-01-06 VITALS — BP 137/74 | HR 74 | Wt 172.0 lb

## 2019-01-06 DIAGNOSIS — I10 Essential (primary) hypertension: Secondary | ICD-10-CM

## 2019-01-06 DIAGNOSIS — I25118 Atherosclerotic heart disease of native coronary artery with other forms of angina pectoris: Secondary | ICD-10-CM | POA: Diagnosis not present

## 2019-01-06 DIAGNOSIS — E785 Hyperlipidemia, unspecified: Secondary | ICD-10-CM

## 2019-01-06 DIAGNOSIS — R0789 Other chest pain: Secondary | ICD-10-CM

## 2019-01-06 DIAGNOSIS — R002 Palpitations: Secondary | ICD-10-CM

## 2019-01-06 MED ORDER — VALSARTAN 160 MG PO TABS: 160.0000 mg | ORAL_TABLET | Freq: Two times a day (BID) | ORAL | 2 refills | Status: DC

## 2019-01-06 MED ORDER — METOPROLOL TARTRATE 50 MG PO TABS: 50.0000 mg | ORAL_TABLET | Freq: Two times a day (BID) | ORAL | 3 refills | Status: AC

## 2019-01-06 NOTE — Patient Instructions (Signed)
Medication Instructions:  Your physician recommends that you continue on your current medications as directed. Please refer to the Current Medication list given to you today.  If you need a refill on your cardiac medications before your next appointment, please call your pharmacy.   Lab work: None.  If you have labs (blood work) drawn today and your tests are completely normal, you will receive your results only by: Marland Kitchen MyChart Message (if you have MyChart) OR . A paper copy in the mail If you have any lab test that is abnormal or we need to change your treatment, we will call you to review the results.  Testing/Procedures: Your physician has requested that you have a stress echocardiogram. For further information please visit [email or web address] Please follow instruction sheet as given.  Hold your metoprolol 24 hours before this test. Do not eat drink or use tobacco products 4 hours before the test.  Dress prepared to exercise. Notify office 24 hours before if you must cancel appointment to avoid any cancellation fees.  If you have any questions please call [phone]   Follow-Up: At [facility], you and your health needs are our priority.  As part of our continuing mission to provide you with exceptional heart care, we have created designated Provider Care Teams.  These Care Teams include your primary Cardiologist (physician) and Advanced Practice Providers (APPs -  Physician Assistants and Nurse Practitioners) who all work together to provide you with the care you need, when you need it. You will need a follow up appointment in 4 months.  Please call our office 2 months in advance to schedule this appointment.  You may see Gypsy Balsam, MD or another member of our [facility] Provider Team in [place]: Norman Herrlich, MD . Belva Crome, MD  Any Other Special Instructions Will Be Listed Below (If Applicable).   Exercise Stress Echocardiogram  An exercise stress  echocardiogram is a test to check how well your heart is working. This test uses sound waves (ultrasound) and a computer to make images of your heart before and after exercise. Ultrasound images that are taken before you exercise (your resting echocardiogram) will show how much blood is getting to your heart muscle and how well your heart muscle and heart valves are functioning. During the next part of this test, you will walk on a treadmill or ride a stationary bike to see how exercise affects your heart. While you exercise, the electrical activity of your heart will be monitored with an electrocardiogram (ECG). Your blood pressure will also be monitored. You may have this test if you:  Have chest pain or other symptoms of a heart problem.  Recently had a heart attack or heart surgery.  Have heart valve problems.  Have a condition that causes narrowing of the blood vessels that supply your heart (coronary artery disease).  Have a high risk of heart disease and are starting a new exercise program.  Have a high risk of heart disease and need to have major surgery. Tell a health care provider about:  Any allergies you have.  All medicines you are taking, including vitamins, herbs, eye drops, creams, and over-the-counter medicines.  Any problems you or family members have had with anesthetic medicines.  Any blood disorders you have.  Any surgeries you have had.  Any medical conditions you have.  Whether you are pregnant or may be pregnant. What are the risks? Generally, this is a safe procedure. However, problems may occur, including:  Chest pain.  Dizziness or light-headedness.  Shortness of breath.  Increased or irregular heartbeat (palpitations).  Nausea or vomiting.  Heart attack (very rare). What happens before the procedure?  Follow instructions from your health care provider about eating or drinking restrictions. You may be asked to avoid all forms of caffeine for  24 hours before your procedure, or as told by your health care provider.  Ask your health care provider about changing or stopping your regular medicines. This is especially important if you are taking diabetes medicines or blood thinners.  If you use an inhaler, bring it with you to the test.  Wear loose, comfortable clothing and walking shoes.  Do notuse any products that contain nicotine or tobacco, such as cigarettes and e-cigarettes, for 4 hours before the test or as told by your health care provider. If you need help quitting, ask your health care provider. What happens during the procedure?  You will take off your clothes from the waist up and put on a hospital gown.  A technician will place electrodes on your chest.  A blood pressure cuff will be placed on your arm.  You will lie down on a table for an ultrasound exam before you exercise. Gel will be rubbed on your chest, and a handheld device (transducer) will be pressed against your chest and moved over your heart.  Then, you will start exercising by walking on a treadmill or pedaling a stationary bicycle.  Your blood pressure and heart rhythm will be monitored while you exercise.  The exercise will gradually get harder or faster.  You will exercise until: ? Your heart reaches a target level. ? You are too tired to continue. ? You cannot continue because of chest pain, weakness, or dizziness.  You will have another ultrasound exam after you stop exercising. The procedure may vary among health care providers and hospitals. What happens after the procedure?  Your heart rate and blood pressure will be monitored until they return to your normal levels. Summary  An exercise stress echocardiogram is a test that uses ultrasound to check how well your heart works before and after exercise.  Before the test, follow instructions from your health care provider about stopping medications, avoiding nicotine and tobacco, and  avoiding certain foods and drinks.  During the test, your blood pressure and heart rhythm will be monitored while you exercise on a treadmill or stationary bicycle. This information is not intended to replace advice given to you by your health care provider. Make sure you discuss any questions you have with your health care provider. Document Released: 08/15/2004 Document Revised: 04/02/2016 Document Reviewed: 04/02/2016 Elsevier Interactive Patient Education  2019 [employer].

## 2019-01-07 DIAGNOSIS — R0789 Other chest pain: Secondary | ICD-10-CM | POA: Insufficient documentation

## 2019-01-07 DIAGNOSIS — R079 Chest pain, unspecified: Secondary | ICD-10-CM | POA: Insufficient documentation

## 2019-01-07 HISTORY — DX: Other chest pain: R07.89

## 2019-01-07 NOTE — Progress Notes (Signed)
Virtual Visit via Telephone Note   This visit type was conducted due to national recommendations for restrictions regarding the COVID-19 Pandemic (e.g. social distancing) in an effort to limit this patient's exposure and mitigate transmission in our community.  Due to her co-morbid illnesses, this patient is at least at moderate risk for complications without adequate follow up.  This format is felt to be most appropriate for this patient at this time.  The patient did not have access to video technology/had technical difficulties with video requiring transitioning to audio format only (telephone).  All issues noted in this document were discussed and addressed.  No physical exam could be performed with this format.  Please refer to the patient's chart for her  consent to telehealth for [facility].  Evaluation Performed:  Follow-up visit  This visit type was conducted due to national recommendations for restrictions regarding the COVID-19 Pandemic (e.g. social distancing).  This format is felt to be most appropriate for this patient at this time.  All issues noted in this document were discussed and addressed.  No physical exam was performed (except for noted visual exam findings with Video Visits).  Please refer to the patient's chart (MyChart message for video visits and phone note for telephone visits) for the patient's consent to telehealth for [facility].  Date:  01/07/2019  ID: Kaitlyn Jimenez, DOB 1951/03/26, MRN [id number]   Patient Location: [address] [place] [address]   Provider location:   [facility] [place] [facility]  PCP:  Guadalupe Maple., MD  Cardiologist:  Gypsy Balsam, MD     Chief Complaint: I am doing better but have some chest pain  History of Present Illness:    Kaitlyn Jimenez is a 68 y.o. female  who presents via audio/video conferencing for a telehealth visit today.  With coronary artery disease, also supraventricular tachycardia  last time I saw her I added Cardizem CD 120 to her medical regimen.  She try to take it but she was not able to because of weakness and fatigue and low blood pressure.  Now she is back to only metoprolol and her arrhythmia seems to be controlled.  At the end of the visit she started complaining about the fact that she does have some chest pain.  It is located on the left side of her chest with radiation towards the left arm.  Not related to exercise.  Also she got some nausea and she is worried that it may be related to her heart.  Previously when she had heart problems she had similar presentation.  I will schedule her to have a stress test.  Otherwise she seems to be doing fine   The patient does not have symptoms concerning for COVID-19 infection (fever, chills, cough, or new SHORTNESS OF BREATH).    Prior CV studies:   The following studies were reviewed today:       Past Medical History:  Diagnosis Date  . Hypertension   . MI (myocardial infarction) (HCC)   . MI (myocardial infarction) (HCC)   . Thyroid disease     Past Surgical History:  Procedure Laterality Date  . ABDOMINAL HYSTERECTOMY    . CARDIAC CATHETERIZATION    . CHOLECYSTECTOMY       Current Meds  Medication Sig  . ALPRAZolam (XANAX) 1 MG tablet Take 1 mg 2 (two) times daily as needed by mouth for anxiety.  Marland Kitchen aspirin EC 81 MG tablet Take 324 mg daily  by mouth.  . cetirizine (ZYRTEC) 10 MG tablet Take 10 mg daily by mouth.  . EPINEPHrine 0.3 mg/0.3 mL IJ SOAJ injection Inject 0.3 mg once into the muscle.  . famotidine (PEPCID) 20 MG tablet Take 20 mg as needed by mouth for heartburn or indigestion.  Marland Kitchen. levothyroxine (SYNTHROID, LEVOTHROID) 100 MCG tablet Take 1 tablet daily by mouth.  . metoprolol tartrate (LOPRESSOR) 50 MG tablet Take 1 tablet (50 mg total) by mouth 2 (two) times daily.  . nitroGLYCERIN (NITROSTAT) 0.4 MG SL tablet Place 0.4 mg under the tongue every 5 (five) minutes as needed for chest pain.  .  valsartan (DIOVAN) 160 MG tablet Take 1 tablet (160 mg total) by mouth 2 (two) times daily.  . [DISCONTINUED] metoprolol tartrate (LOPRESSOR) 50 MG tablet Take 50 mg 2 (two) times daily by mouth.  . [DISCONTINUED] valsartan (DIOVAN) 160 MG tablet TAKE 1 TABLET BY MOUTH TWICE DAILY      Family History: The patient's family history includes Arrhythmia in her sister; Congestive Heart Failure in her brother; Heart attack in her brother and father; Heart disease in her maternal uncle, mother, and paternal uncle; Rheum arthritis in her brother.   ROS:   Please see the history of present illness.     All other systems reviewed and are negative.   Labs/Other Tests and Data Reviewed:     Recent Labs: No results found for requested labs within last 8760 hours.  Recent Lipid Panel No results found for: CHOL, TRIG, HDL, CHOLHDL, VLDL, LDLCALC, LDLDIRECT    Exam:    Vital Signs:  BP 137/74   Pulse 74   Wt 172 lb (78 kg)   BMI 32.50 kg/m     Wt Readings from Last 3 Encounters:  01/06/19 172 lb (78 kg)  10/08/18 174 lb 9.6 oz (79.2 kg)  06/11/18 176 lb 12.8 oz (80.2 kg)     Well nourished, well developed in no acute distress. Alert awake oriented x3 happy to be able to talk to me.  Diagnosis for this visit:   1. Coronary artery disease of native artery of native heart with stable angina pectoris (HCC)   2. Dyslipidemia   3. Palpitations   4. Essential hypertension   5. Atypical chest pain      ASSESSMENT & PLAN:    1.  Coronary disease.  Stress test will be scheduled to make sure there is no inducible ischemia.  I will not alter any of her medications right now. 2.  Dyslipidemia she had appointment with lipid clinic to talk about PCSK9 agent however she missed that appointment because of the fact that her husband was sick.  She is willing to go there now and talk about potentially initiating new medication. 3.  Palpitations much better in spite of the fact that she was not  able to tolerate Cardizem.  Will continue management. 4.  Essential hypertension blood pressure control. 5.  Atypical chest pain.  She be scheduled to have a stress test.  COVID-19 Education: The signs and symptoms of COVID-19 were discussed with the patient and how to seek care for testing (follow up with PCP or arrange E-visit).  The importance of social distancing was discussed today.  Patient Risk:   After full review of this patients clinical status, I feel that they are at least moderate risk at this time.  Time:   Today, I have spent 18 minutes with the patient with telehealth technology discussing pt health issues.  I  spent 5 minutes reviewing her chart before the visit.  Visit was finished at 4:32 PM.    Medication Adjustments/Labs and Tests Ordered: Current medicines are reviewed at length with the patient today.  Concerns regarding medicines are outlined above.  Orders Placed This Encounter  Procedures  . AMB Referral to [facility]  . ECHOCARDIOGRAM STRESS TEST   Medication changes:  Meds ordered this encounter  Medications  . valsartan (DIOVAN) 160 MG tablet    Sig: Take 1 tablet (160 mg total) by mouth 2 (two) times daily.    Dispense:  120 tablet    Refill:  2  . metoprolol tartrate (LOPRESSOR) 50 MG tablet    Sig: Take 1 tablet (50 mg total) by mouth 2 (two) times daily.    Dispense:  60 tablet    Refill:  3     Disposition: Follow-up in 2 months.  Signed, Georgeanna Lea, MD, [facility] 01/07/2019 8:45 AM    [facility]

## 2019-01-12 ENCOUNTER — Telehealth: Payer: Self-pay | Admitting: Internal Medicine

## 2019-01-12 NOTE — Telephone Encounter (Signed)
Called patient to schedule new patient lipid clinic appt with Dr. Rennis Golden.  Patient does not wish to schedule until we are seeing patients in the office.  She requested someone call her when we scheduling OVs and she will make an appointment.

## 2019-03-01 ENCOUNTER — Other Ambulatory Visit: Payer: Self-pay

## 2019-03-01 ENCOUNTER — Inpatient Hospital Stay (HOSPITAL_BASED_OUTPATIENT_CLINIC_OR_DEPARTMENT_OTHER)
Admission: EM | Admit: 2019-03-01 | Discharge: 2019-03-03 | DRG: 247 | Disposition: A | Discharge status: Home / Self Care | Payer: Medicare HMO | Attending: Cardiology | Admitting: Cardiology

## 2019-03-01 ENCOUNTER — Emergency Department (HOSPITAL_BASED_OUTPATIENT_CLINIC_OR_DEPARTMENT_OTHER): Payer: Medicare HMO

## 2019-03-01 ENCOUNTER — Ambulatory Visit (INDEPENDENT_AMBULATORY_CARE_PROVIDER_SITE_OTHER): Payer: Medicare HMO | Admitting: Cardiology

## 2019-03-01 ENCOUNTER — Encounter (HOSPITAL_BASED_OUTPATIENT_CLINIC_OR_DEPARTMENT_OTHER): Payer: Self-pay | Admitting: Emergency Medicine

## 2019-03-01 ENCOUNTER — Encounter: Payer: Self-pay | Admitting: Cardiology

## 2019-03-01 VITALS — BP 200/76 | HR 78 | Ht 61.0 in | Wt 178.4 lb

## 2019-03-01 DIAGNOSIS — Z79890 Hormone replacement therapy (postmenopausal): Secondary | ICD-10-CM | POA: Diagnosis not present

## 2019-03-01 DIAGNOSIS — I253 Aneurysm of heart: Secondary | ICD-10-CM | POA: Diagnosis present

## 2019-03-01 DIAGNOSIS — Y712 Prosthetic and other implants, materials and accessory cardiovascular devices associated with adverse incidents: Secondary | ICD-10-CM | POA: Diagnosis present

## 2019-03-01 DIAGNOSIS — R0789 Other chest pain: Secondary | ICD-10-CM

## 2019-03-01 DIAGNOSIS — Z8249 Family history of ischemic heart disease and other diseases of the circulatory system: Secondary | ICD-10-CM | POA: Diagnosis not present

## 2019-03-01 DIAGNOSIS — I200 Unstable angina: Secondary | ICD-10-CM

## 2019-03-01 DIAGNOSIS — I10 Essential (primary) hypertension: Secondary | ICD-10-CM

## 2019-03-01 DIAGNOSIS — Z888 Allergy status to other drugs, medicaments and biological substances status: Secondary | ICD-10-CM

## 2019-03-01 DIAGNOSIS — I25118 Atherosclerotic heart disease of native coronary artery with other forms of angina pectoris: Secondary | ICD-10-CM | POA: Diagnosis not present

## 2019-03-01 DIAGNOSIS — E785 Hyperlipidemia, unspecified: Secondary | ICD-10-CM | POA: Diagnosis present

## 2019-03-01 DIAGNOSIS — Y92230 Patient room in hospital as the place of occurrence of the external cause: Secondary | ICD-10-CM | POA: Diagnosis not present

## 2019-03-01 DIAGNOSIS — Y831 Surgical operation with implant of artificial internal device as the cause of abnormal reaction of the patient, or of later complication, without mention of misadventure at the time of the procedure: Secondary | ICD-10-CM | POA: Diagnosis present

## 2019-03-01 DIAGNOSIS — Z955 Presence of coronary angioplasty implant and graft: Secondary | ICD-10-CM | POA: Diagnosis not present

## 2019-03-01 DIAGNOSIS — Z789 Other specified health status: Secondary | ICD-10-CM | POA: Diagnosis not present

## 2019-03-01 DIAGNOSIS — Z20828 Contact with and (suspected) exposure to other viral communicable diseases: Secondary | ICD-10-CM | POA: Diagnosis present

## 2019-03-01 DIAGNOSIS — I252 Old myocardial infarction: Secondary | ICD-10-CM

## 2019-03-01 DIAGNOSIS — D72828 Other elevated white blood cell count: Secondary | ICD-10-CM | POA: Diagnosis not present

## 2019-03-01 DIAGNOSIS — T380X5A Adverse effect of glucocorticoids and synthetic analogues, initial encounter: Secondary | ICD-10-CM | POA: Diagnosis not present

## 2019-03-01 DIAGNOSIS — T82855A Stenosis of coronary artery stent, initial encounter: Secondary | ICD-10-CM | POA: Diagnosis not present

## 2019-03-01 DIAGNOSIS — I25119 Atherosclerotic heart disease of native coronary artery with unspecified angina pectoris: Secondary | ICD-10-CM | POA: Diagnosis present

## 2019-03-01 DIAGNOSIS — F411 Generalized anxiety disorder: Secondary | ICD-10-CM | POA: Diagnosis present

## 2019-03-01 DIAGNOSIS — E039 Hypothyroidism, unspecified: Secondary | ICD-10-CM | POA: Diagnosis present

## 2019-03-01 DIAGNOSIS — Z79899 Other long term (current) drug therapy: Secondary | ICD-10-CM | POA: Diagnosis not present

## 2019-03-01 DIAGNOSIS — E7849 Other hyperlipidemia: Secondary | ICD-10-CM | POA: Diagnosis not present

## 2019-03-01 DIAGNOSIS — Z91041 Radiographic dye allergy status: Secondary | ICD-10-CM | POA: Diagnosis not present

## 2019-03-01 DIAGNOSIS — Z882 Allergy status to sulfonamides status: Secondary | ICD-10-CM

## 2019-03-01 DIAGNOSIS — Z87891 Personal history of nicotine dependence: Secondary | ICD-10-CM | POA: Diagnosis not present

## 2019-03-01 DIAGNOSIS — Z7982 Long term (current) use of aspirin: Secondary | ICD-10-CM

## 2019-03-01 DIAGNOSIS — I447 Left bundle-branch block, unspecified: Secondary | ICD-10-CM | POA: Diagnosis not present

## 2019-03-01 DIAGNOSIS — Z880 Allergy status to penicillin: Secondary | ICD-10-CM | POA: Diagnosis not present

## 2019-03-01 DIAGNOSIS — R079 Chest pain, unspecified: Secondary | ICD-10-CM | POA: Diagnosis not present

## 2019-03-01 DIAGNOSIS — I25110 Atherosclerotic heart disease of native coronary artery with unstable angina pectoris: Secondary | ICD-10-CM | POA: Diagnosis present

## 2019-03-01 DIAGNOSIS — I2510 Atherosclerotic heart disease of native coronary artery without angina pectoris: Secondary | ICD-10-CM | POA: Diagnosis present

## 2019-03-01 HISTORY — DX: Unstable angina: I20.0

## 2019-03-01 LAB — CBC WITH DIFFERENTIAL/PLATELET
Abs Immature Granulocytes: 0.08 10*3/uL — ABNORMAL HIGH (ref 0.00–0.07)
Basophils Absolute: 0.1 10*3/uL (ref 0.0–0.1)
Basophils Relative: 1 %
Eosinophils Absolute: 0.3 10*3/uL (ref 0.0–0.5)
Eosinophils Relative: 4 %
HCT: 43.5 % (ref 36.0–46.0)
Hemoglobin: 14 g/dL (ref 12.0–15.0)
Immature Granulocytes: 1 %
Lymphocytes Relative: 22 %
Lymphs Abs: 1.7 10*3/uL (ref 0.7–4.0)
MCH: 29.4 pg (ref 26.0–34.0)
MCHC: 32.2 g/dL (ref 30.0–36.0)
MCV: 91.2 fL (ref 80.0–100.0)
Monocytes Absolute: 0.7 10*3/uL (ref 0.1–1.0)
Monocytes Relative: 8 %
Neutro Abs: 5.2 10*3/uL (ref 1.7–7.7)
Neutrophils Relative %: 64 %
Platelets: 276 10*3/uL (ref 150–400)
RBC: 4.77 MIL/uL (ref 3.87–5.11)
RDW: 12.7 % (ref 11.5–15.5)
WBC: 8 10*3/uL (ref 4.0–10.5)
nRBC: 0 % (ref 0.0–0.2)

## 2019-03-01 LAB — BASIC METABOLIC PANEL
Anion gap: 9 (ref 5–15)
BUN: 12 mg/dL (ref 8–23)
CO2: 24 mmol/L (ref 22–32)
Calcium: 9.4 mg/dL (ref 8.9–10.3)
Chloride: 102 mmol/L (ref 98–111)
Creatinine, Ser: 0.77 mg/dL (ref 0.44–1.00)
GFR calc Af Amer: >60 mL/min (ref >60)
GFR calc non Af Amer: >60 mL/min (ref >60)
Glucose, Bld: 108 mg/dL — ABNORMAL HIGH (ref 70–99)
Potassium: 4.2 mmol/L (ref 3.5–5.1)
Sodium: 135 mmol/L (ref 135–145)

## 2019-03-01 LAB — TROPONIN I (HIGH SENSITIVITY)
Troponin I (High Sensitivity): 12 ng/L (ref <18)
Troponin I (High Sensitivity): 6 ng/L (ref <18)
Troponin I (High Sensitivity): 13 ng/L (ref <18)

## 2019-03-01 LAB — SARS CORONAVIRUS 2 AG (30 MIN TAT): SARS Coronavirus 2 Ag: NEGATIVE

## 2019-03-01 LAB — TSH: TSH: 0.439 u[IU]/mL (ref 0.350–4.500)

## 2019-03-01 LAB — HEMOGLOBIN A1C
Hgb A1c MFr Bld: 5.4 % (ref 4.8–5.6)
Mean Plasma Glucose: 108.28 mg/dL

## 2019-03-01 LAB — PROTIME-INR
INR: 1 (ref 0.8–1.2)
Prothrombin Time: 13.2 seconds (ref 11.4–15.2)

## 2019-03-01 MED ORDER — ALPRAZOLAM 0.25 MG PO TABS: 0.2500 mg | ORAL_TABLET | Freq: Two times a day (BID) | ORAL | Status: DC | PRN

## 2019-03-01 MED ORDER — IRBESARTAN 150 MG PO TABS
150.0000 mg | ORAL_TABLET | Freq: Every day | ORAL | Status: DC
  Administered 2019-03-02 – 2019-03-03 (×2): 150 mg via ORAL
  Filled 2019-03-01 (×2): qty 1

## 2019-03-01 MED ORDER — ONDANSETRON HCL 4 MG/2ML IJ SOLN: 4.0000 mg | Freq: Four times a day (QID) | INTRAMUSCULAR | Status: DC | PRN

## 2019-03-01 MED ORDER — ACETAMINOPHEN 325 MG PO TABS: 650.0000 mg | ORAL_TABLET | ORAL | Status: DC | PRN

## 2019-03-01 MED ORDER — FAMOTIDINE 20 MG PO TABS: 20.0000 mg | ORAL_TABLET | ORAL | Status: DC | PRN

## 2019-03-01 MED ORDER — DIPHENHYDRAMINE HCL 50 MG/ML IJ SOLN: 50.0000 mg | Freq: Once | INTRAMUSCULAR | Status: AC

## 2019-03-01 MED ORDER — NITROGLYCERIN 0.4 MG SL SUBL
0.4000 mg | SUBLINGUAL_TABLET | Freq: Once | SUBLINGUAL | Status: AC
  Administered 2019-03-01: 14:00:00 0.4 mg via SUBLINGUAL
  Filled 2019-03-01: qty 1

## 2019-03-01 MED ORDER — ASPIRIN EC 81 MG PO TBEC
81.0000 mg | DELAYED_RELEASE_TABLET | Freq: Every day | ORAL | Status: DC
  Administered 2019-03-02 – 2019-03-03 (×2): 81 mg via ORAL
  Filled 2019-03-01 (×2): qty 1

## 2019-03-01 MED ORDER — ASPIRIN 81 MG PO CHEW
162.0000 mg | CHEWABLE_TABLET | Freq: Once | ORAL | Status: AC
  Administered 2019-03-01: 162 mg via ORAL
  Filled 2019-03-01: qty 2

## 2019-03-01 MED ORDER — SODIUM CHLORIDE 0.9% FLUSH
3.0000 mL | Freq: Two times a day (BID) | INTRAVENOUS | Status: DC
  Administered 2019-03-01 – 2019-03-02 (×3): 3 mL via INTRAVENOUS

## 2019-03-01 MED ORDER — LORATADINE 10 MG PO TABS
10.0000 mg | ORAL_TABLET | Freq: Every day | ORAL | Status: DC
  Filled 2019-03-01 (×2): qty 1

## 2019-03-01 MED ORDER — HEPARIN (PORCINE) 25000 UT/250ML-% IV SOLN
800.0000 [IU]/h | INTRAVENOUS | Status: DC
  Administered 2019-03-01: 21:00:00 800 [IU]/h via INTRAVENOUS
  Filled 2019-03-01: qty 250

## 2019-03-01 MED ORDER — SODIUM CHLORIDE 0.9 % WEIGHT BASED INFUSION: 1.0000 mL/kg/h | INTRAVENOUS | Status: DC

## 2019-03-01 MED ORDER — DIPHENHYDRAMINE HCL 25 MG PO CAPS
50.0000 mg | ORAL_CAPSULE | Freq: Once | ORAL | Status: AC
  Administered 2019-03-02: 50 mg via ORAL
  Filled 2019-03-01: qty 2

## 2019-03-01 MED ORDER — SODIUM CHLORIDE 0.9 % WEIGHT BASED INFUSION
3.0000 mL/kg/h | INTRAVENOUS | Status: DC
  Administered 2019-03-02: 3 mL/kg/h via INTRAVENOUS

## 2019-03-01 MED ORDER — ZOLPIDEM TARTRATE 5 MG PO TABS: 5.0000 mg | ORAL_TABLET | Freq: Every evening | ORAL | Status: DC | PRN

## 2019-03-01 MED ORDER — NITROGLYCERIN 0.4 MG SL SUBL: 0.4000 mg | SUBLINGUAL_TABLET | SUBLINGUAL | Status: DC | PRN

## 2019-03-01 MED ORDER — ASPIRIN 81 MG PO CHEW
81.0000 mg | CHEWABLE_TABLET | ORAL | Status: AC
  Administered 2019-03-02: 81 mg via ORAL
  Filled 2019-03-01: qty 1

## 2019-03-01 MED ORDER — ALPRAZOLAM 0.5 MG PO TABS
1.0000 mg | ORAL_TABLET | Freq: Two times a day (BID) | ORAL | Status: DC | PRN
  Administered 2019-03-02: 1 mg via ORAL
  Filled 2019-03-01: qty 2

## 2019-03-01 MED ORDER — LEVOTHYROXINE SODIUM 100 MCG PO TABS
100.0000 ug | ORAL_TABLET | Freq: Every day | ORAL | Status: DC
  Administered 2019-03-02 – 2019-03-03 (×2): 100 ug via ORAL
  Filled 2019-03-01 (×2): qty 1

## 2019-03-01 MED ORDER — SODIUM CHLORIDE 0.9 % IV SOLN
250.0000 mL | INTRAVENOUS | Status: DC | PRN
  Administered 2019-03-01: 21:00:00 5 mL via INTRAVENOUS

## 2019-03-01 MED ORDER — SODIUM CHLORIDE 0.9% FLUSH: 3.0000 mL | INTRAVENOUS | Status: DC | PRN

## 2019-03-01 MED ORDER — PREDNISONE 20 MG PO TABS
50.0000 mg | ORAL_TABLET | Freq: Four times a day (QID) | ORAL | Status: AC
  Administered 2019-03-01 – 2019-03-02 (×3): 50 mg via ORAL
  Filled 2019-03-01 (×3): qty 2

## 2019-03-01 MED ORDER — METOPROLOL TARTRATE 50 MG PO TABS
50.0000 mg | ORAL_TABLET | Freq: Two times a day (BID) | ORAL | Status: DC
  Administered 2019-03-01 – 2019-03-03 (×4): 50 mg via ORAL
  Filled 2019-03-01 (×4): qty 1

## 2019-03-01 MED ORDER — SODIUM CHLORIDE 0.9% FLUSH
3.0000 mL | Freq: Two times a day (BID) | INTRAVENOUS | Status: DC
  Administered 2019-03-01: 3 mL via INTRAVENOUS

## 2019-03-01 MED ORDER — SODIUM CHLORIDE 0.9 % IV SOLN: 250.0000 mL | INTRAVENOUS | Status: DC | PRN

## 2019-03-01 MED ORDER — HEPARIN BOLUS VIA INFUSION
4000.0000 [IU] | Freq: Once | INTRAVENOUS | Status: AC
  Administered 2019-03-01: 4000 [IU] via INTRAVENOUS
  Filled 2019-03-01: qty 4000

## 2019-03-01 NOTE — ED Notes (Signed)
Report to Minette Brine, Therapist, sports at [place]

## 2019-03-01 NOTE — H&P (Signed)
Cardiology Admission History and Physical:   Patient ID: Kaitlyn Jimenez MRN: [id number]; DOB: 1951-08-22   Admission date: 03/01/2019  Primary Care Provider: Guadalupe MapleGage, John F., MD Primary Cardiologist: Gypsy Balsamobert Krasowski, MD  Primary Electrophysiologist:  None   Chief Complaint:  Chest pain  Patient Profile:   Kaitlyn Jimenez is a 68 y.o. female with history of CAD with prior interventions presents with chest pain  History of Present Illness:   Kaitlyn Jimenez 68 yo female history of CAD, PSVT, HTN, HL, chronic LBBB admitted with chest pain.  Do not see prior caths in our system nor descriptions of anatomy in prior clinic notes. From clinic notes reported she does have  history of PCI in 2007 and 2013. Last cath 2015 with mild nonobstructive disease.   Presents with 4-5 days of left arm pain and fatigue. She was concerned because primary symptoms for her MI in 2013 was similar, mainly left arm pain. She reports episode of exertional tightness in chest about 1-2 weeks ago with walking up hill, resolved with rest. Reports episode of tightness in chest today that resolved with NG. The left arm pain is not positional, can last for hours at a time.    WBC 8 Hgb 14 Plt 276 K 4.2 Cr 0.77  hsTrop 6--> EKG SR, chronic LBBB COVID neg CXR no acute process 06/2017 echo: LVEF 60-65%, no WMAs, grade I diastolic dysfunction  Heart Pathway Score:  HEAR Score: 7  Past Medical History:  Diagnosis Date  . Hypertension   . MI (myocardial infarction) (HCC)   . MI (myocardial infarction) (HCC)   . Thyroid disease     Past Surgical History:  Procedure Laterality Date  . ABDOMINAL HYSTERECTOMY    . CARDIAC CATHETERIZATION    . CHOLECYSTECTOMY    . CORONARY ANGIOPLASTY WITH STENT PLACEMENT       Medications Prior to Admission: Prior to Admission medications   Medication Sig Start Date End Date Taking? Authorizing Provider  ALPRAZolam Prudy Feeler(XANAX) 1 MG tablet Take 1 mg 2 (two) times daily as needed by  mouth for anxiety.    [provider]  aspirin EC 81 MG tablet Take 324 mg daily by mouth.    [provider]  cetirizine (ZYRTEC) 10 MG tablet Take 10 mg daily by mouth.    [provider]  EPINEPHrine 0.3 mg/0.3 mL IJ SOAJ injection Inject 0.3 mg once into the muscle.    [provider]  famotidine (PEPCID) 20 MG tablet Take 20 mg as needed by mouth for heartburn or indigestion.    [provider]  levothyroxine (SYNTHROID, LEVOTHROID) 100 MCG tablet Take 1 tablet daily by mouth.    [provider]  metoprolol tartrate (LOPRESSOR) 50 MG tablet Take 1 tablet (50 mg total) by mouth 2 (two) times daily. 01/06/19   Georgeanna LeaKrasowski, Robert J, MD  nitroGLYCERIN (NITROSTAT) 0.4 MG SL tablet Place 0.4 mg under the tongue every 5 (five) minutes as needed for chest pain.    [provider]  valsartan (DIOVAN) 160 MG tablet Take 1 tablet (160 mg total) by mouth 2 (two) times daily. 01/06/19   Georgeanna LeaKrasowski, Robert J, MD     Allergies:    Allergies  Allergen Reactions  . Penciclovir Hives  . Shellfish-Derived Products Other (See Comments)    Breathing difficulty  . Sulfa Antibiotics Hives  . Contrast Media [Iodinated Diagnostic Agents]     Breathing difficulty  . Penicillins   . Ramipril Nausea And Vomiting  pancreatis    Social History:   Social History   Socioeconomic History  . Marital status: Married    Spouse name: Not on file  . Number of children: Not on file  . Years of education: Not on file  . Highest education level: Not on file  Occupational History  . Not on file  Social Needs  . Financial resource strain: Not on file  . Food insecurity    Worry: Not on file    Inability: Not on file  . Transportation needs    Medical: Not on file    Non-medical: Not on file  Tobacco Use  . Smoking status: Former Research scientist (life sciences)  . Smokeless tobacco: Never Used  Substance and Sexual Activity  . Alcohol use: No    Frequency: Never  . Drug  use: No  . Sexual activity: Not on file  Lifestyle  . Physical activity    Days per week: Not on file    Minutes per session: Not on file  . Stress: Not on file  Relationships  . Social Herbalist on phone: Not on file    Gets together: Not on file    Attends religious service: Not on file    Active member of club or organization: Not on file    Attends meetings of clubs or organizations: Not on file    Relationship status: Not on file  . Intimate partner violence    Fear of current or ex partner: Not on file    Emotionally abused: Not on file    Physically abused: Not on file    Forced sexual activity: Not on file  Other Topics Concern  . Not on file  Social History Narrative  . Not on file    Family History:   The patient's family history includes Arrhythmia in her sister; Congestive Heart Failure in her brother; Heart attack in her brother and father; Heart disease in her maternal uncle, mother, and paternal uncle; Rheum arthritis in her brother.    ROS:  Please see the history of present illness.  All other ROS reviewed and negative.     Physical Exam/Data:   Vitals:   03/01/19 1530 03/01/19 1700 03/01/19 1730 03/01/19 1800  BP: (!) 168/75 (!) 148/69 (!) 153/72 (!) 157/64  Pulse: 74 76 74 74  Resp: 13 15 13 19   Temp:      TempSrc:      SpO2: 100% 100% 100% 100%  Weight:      Height:       No intake or output data in the 24 hours ending 03/01/19 1851 Last 3 Weights 03/01/2019 03/01/2019 01/06/2019  Weight (lbs) 178 lb 5.6 oz 178 lb 6.4 oz 172 lb  Weight (kg) 80.9 kg 80.922 kg 78.019 kg     Body mass index is 33.7 kg/m.  General:  Well nourished, well developed, in no acute distress HEENT: normal Lymph: no adenopathy Neck: no JVD Endocrine:  No thryomegaly Cardiac:  normal S1, S2; RRR; no murmur Lungs:  clear to auscultation bilaterally, no wheezing, rhonchi or rales  Abd: soft, nontender, no hepatomegaly  Ext: no LE edema Musculoskeletal:  No  deformities, BUE and BLE strength normal and equal Skin: warm and dry  Neuro:  CNs 2-12 intact, no focal abnormalities noted Psych:  Normal affect     Laboratory Data:  High Sensitivity Troponin:   Recent Labs  Lab 03/01/19 1341  TROPONINIHS 12  6  Cardiac EnzymesNo results for input(s): TROPONINI in the last 168 hours. No results for input(s): TROPIPOC in the last 168 hours.  Chemistry Recent Labs  Lab 03/01/19 1341  NA 135  K 4.2  CL 102  CO2 24  GLUCOSE 108*  BUN 12  CREATININE 0.77  CALCIUM 9.4  GFRNONAA >60  GFRAA >60  ANIONGAP 9    No results for input(s): PROT, ALBUMIN, AST, ALT, ALKPHOS, BILITOT in the last 168 hours. Hematology Recent Labs  Lab 03/01/19 1341  WBC 8.0  RBC 4.77  HGB 14.0  HCT 43.5  MCV 91.2  MCH 29.4  MCHC 32.2  RDW 12.7  PLT 276   BNPNo results for input(s): BNP, PROBNP in the last 168 hours.  DDimer No results for input(s): DDIMER in the last 168 hours.   Radiology/Studies:  Dg Chest Port 1 View  Result Date: 03/01/2019 CLINICAL DATA:  Pt c/o chest pain, pressure and left arm pain since this am, hx MI in 2013, HTN, cardiac cath. 3 stents EXAM: PORTABLE CHEST 1 VIEW COMPARISON:  03/05/2017 FINDINGS: Cardiac silhouette is normal in size and configuration. No mediastinal or hilar masses. No evidence of adenopathy. Streaky opacities noted in the medial lung bases consistent with chronic bronchitic change, stable. Lungs are otherwise clear. No evidence of a pleural effusion. No pneumothorax. Skeletal structures are grossly intact. IMPRESSION: No active disease. Electronically Signed   By: Amie Portland M.D.   On: 03/01/2019 13:49    Assessment and Plan:   1. CAD/possible unstable angina - somewhat atypical symptoms with long duration left arm pain, however similar symptoms with her MI in 2013. She has had some exertional chest pressure as well, as well as episode of chest pressure today that resolved with NG - prior history of CAD  with interventions in 2007 and 2013, mild nonobstructive disease by cath in 2015 - no objective evidence of ischemia thus far - given her strong history and symptoms would plan for cath for a definitive evaluation - dye allergy, will need prep overnight - medical therapy with ASA, ibresartan, lopressor, hep gtt. Clinic notes indicate statin intolerance, were looking into pcsk9 inhibtior but not established yet it appears     For questions or updates, please contact [facility] Please consult [email or web address] for contact info under        Signed, Dina Rich, MD  03/01/2019 6:51 PM

## 2019-03-01 NOTE — Addendum Note (Signed)
Addended by: Aleatha Borer on: 03/01/2019 01:59 PM   Modules accepted: Orders

## 2019-03-01 NOTE — ED Provider Notes (Signed)
[place] EMERGENCY DEPARTMENT Provider Note   CSN: [id number] Arrival date & time: 03/01/19  1329    History   Chief Complaint Chief Complaint  Patient presents with  . Chest Pain    HPI Kaitlyn Jimenez is a 68 y.o. female.  Kaitlyn Jimenez is a 68 y.o. female with history of MI, hypertension, CAD, hyperlipidemia, and hypothyroidism, who presents to the emergency department for evaluation of chest pain.  She was sent to the ED from her cardiologist, Dr. Wendy Poet office for further evaluation.  Patient reports that for the past 3 to 4 days she has been having a constant dull ache in her left arm.  She reports that yesterday and today she began having left-sided chest pressure.  She reports this chest pain is worse with exertion, today she did not even want to get up out of bed at first because she knew would make her chest for worse.  She reports that the pain in her left arm feels similar to her heart attack in 2013, although less severe.  She took an ibuprofen yesterday without relief, has not tried anything else to treat her pain, does take 2 baby aspirin daily.  She has nitro at home but has not taken it for her symptoms.  She denies associated nausea or vomiting.  No diaphoresis.  No shortness of breath.  She denies any associated abdominal pain.  She denies any cough, no fever.  No known sick contacts.  She has been taking all of her medications regularly as directed.  Her blood pressure is noted to be elevated on arrival today.  She has history of 2 MIs, had stent placed in 2017, last heart cath was done in 2015 which showed 30% blockages.  Patient does have a contrast allergy and requires premedication before heart catheterization.     Past Medical History:  Diagnosis Date  . Hypertension   . MI (myocardial infarction) ([place])   . MI (myocardial infarction) ([place])   . Thyroid disease     Patient Active Problem List   Diagnosis Date Noted  . Atypical chest pain  01/07/2019  . Coronary artery diseaseStenting in 2007, 2013, last cardiac cath in 2015 only 30% blockage 07/13/2017  . Essential hypertension 07/13/2017  . Dyslipidemia 07/13/2017    Past Surgical History:  Procedure Laterality Date  . ABDOMINAL HYSTERECTOMY    . CARDIAC CATHETERIZATION    . CHOLECYSTECTOMY    . CORONARY ANGIOPLASTY WITH STENT PLACEMENT       OB History   No obstetric history on file.      Home Medications    Prior to Admission medications   Medication Sig Start Date End Date Taking? Authorizing Provider  ALPRAZolam Duanne Moron) 1 MG tablet Take 1 mg 2 (two) times daily as needed by mouth for anxiety.    [provider]  aspirin EC 81 MG tablet Take 324 mg daily by mouth.    [provider]  cetirizine (ZYRTEC) 10 MG tablet Take 10 mg daily by mouth.    [provider]  EPINEPHrine 0.3 mg/0.3 mL IJ SOAJ injection Inject 0.3 mg once into the muscle.    [provider]  famotidine (PEPCID) 20 MG tablet Take 20 mg as needed by mouth for heartburn or indigestion.    [provider]  levothyroxine (SYNTHROID, LEVOTHROID) 100 MCG tablet Take 1 tablet daily by mouth.    [provider]  metoprolol tartrate (LOPRESSOR) 50 MG tablet Take 1 tablet (  50 mg total) by mouth 2 (two) times daily. 01/06/19   Georgeanna LeaKrasowski, Robert J, MD  nitroGLYCERIN (NITROSTAT) 0.4 MG SL tablet Place 0.4 mg under the tongue every 5 (five) minutes as needed for chest pain.    [provider]  valsartan (DIOVAN) 160 MG tablet Take 1 tablet (160 mg total) by mouth 2 (two) times daily. 01/06/19   Georgeanna LeaKrasowski, Robert J, MD    Family History Family History  Problem Relation Age of Onset  . Heart disease Mother   . Heart attack Father   . Heart attack Brother   . Congestive Heart Failure Brother   . Rheum arthritis Brother   . Arrhythmia Sister   . Heart disease Maternal Uncle   . Heart disease Paternal Uncle     Social History Social  History   Tobacco Use  . Smoking status: Former Games developermoker  . Smokeless tobacco: Never Used  Substance Use Topics  . Alcohol use: No    Frequency: Never  . Drug use: No     Allergies   Penciclovir, Shellfish-derived products, Sulfa antibiotics, Contrast media [iodinated diagnostic agents], Penicillins, and Ramipril   Review of Systems Review of Systems  Constitutional: Negative for chills and fever.  HENT: Negative.   Eyes: Negative for visual disturbance.  Respiratory: Negative for cough and shortness of breath.   Cardiovascular: Positive for chest pain. Negative for palpitations and leg swelling.  Gastrointestinal: Negative for abdominal pain, nausea and vomiting.  Genitourinary: Negative for dysuria and frequency.  Musculoskeletal: Positive for myalgias.  Skin: Negative for color change and rash.  Neurological: Negative for dizziness, syncope and light-headedness.     Physical Exam Updated Vital Signs BP (!) 189/86   Pulse 89   Temp 98 F (36.7 C) (Oral)   Resp 14   Ht 5\' 1"  (1.549 m)   Wt 80.9 kg   SpO2 100%   BMI 33.70 kg/m   Physical Exam Vitals signs and nursing note reviewed.  Constitutional:      General: She is not in acute distress.    Appearance: She is well-developed and normal weight. She is not ill-appearing or diaphoretic.  HENT:     Head: Normocephalic and atraumatic.  Eyes:     General:        Right eye: No discharge.        Left eye: No discharge.     Pupils: Pupils are equal, round, and reactive to light.  Neck:     Musculoskeletal: Neck supple.     Vascular: No JVD.     Trachea: No tracheal deviation.  Cardiovascular:     Rate and Rhythm: Normal rate and regular rhythm.     Pulses:          Radial pulses are 2+ on the right side and 2+ on the left side.       Dorsalis pedis pulses are 2+ on the right side and 2+ on the left side.     Heart sounds: Normal heart sounds. No murmur. No friction rub. No gallop.   Pulmonary:     Effort:  Pulmonary effort is normal. No respiratory distress.     Breath sounds: Normal breath sounds. No wheezing or rales.     Comments: Respirations equal and unlabored, patient able to speak in full sentences, lungs clear to auscultation bilaterally Chest:     Chest wall: No tenderness.     Comments: Pain is not reproducible with palpation Abdominal:     General: Bowel  sounds are normal. There is no distension.     Palpations: Abdomen is soft. There is no mass.     Tenderness: There is no abdominal tenderness. There is no guarding.     Comments: Abdomen soft, nondistended, nontender to palpation in all quadrants without guarding or peritoneal signs  Musculoskeletal:        General: No deformity.  Skin:    General: Skin is warm and dry.     Capillary Refill: Capillary refill takes less than 2 seconds.  Neurological:     Mental Status: She is alert.     Coordination: Coordination normal.     Comments: Speech is clear, able to follow commands Moves extremities without ataxia, coordination intact  Psychiatric:        Mood and Affect: Mood normal.        Behavior: Behavior normal.      ED Treatments / Results  Labs (all labs ordered are listed, but only abnormal results are displayed) Labs Reviewed  CBC WITH DIFFERENTIAL/PLATELET - Abnormal; Notable for the following components:      Result Value   Abs Immature Granulocytes 0.08 (*)    All other components within normal limits  BASIC METABOLIC PANEL - Abnormal; Notable for the following components:   Glucose, Bld 108 (*)    All other components within normal limits  SARS CORONAVIRUS 2 (HOSP ORDER, PERFORMED IN [place] LAB VIA ABBOTT ID)  TROPONIN I (HIGH SENSITIVITY)  TROPONIN I (HIGH SENSITIVITY)    EKG EKG Interpretation  Date/Time:  Tuesday March 01 2019 13:37:06 EDT Ventricular Rate:  83 PR Interval:    QRS Duration: 140 QT Interval:  393 QTC Calculation: 462 R Axis:   47 Text Interpretation:  Sinus rhythm Left  bundle branch block No previous ECGs available Confirmed by Frederick PeersLittle, Rachel [phone]) on 03/01/2019 4:01:05 PM   Radiology Dg Chest Port 1 View  Result Date: 03/01/2019 CLINICAL DATA:  Pt c/o chest pain, pressure and left arm pain since this am, hx MI in 2013, HTN, cardiac cath. 3 stents EXAM: PORTABLE CHEST 1 VIEW COMPARISON:  03/05/2017 FINDINGS: Cardiac silhouette is normal in size and configuration. No mediastinal or hilar masses. No evidence of adenopathy. Streaky opacities noted in the medial lung bases consistent with chronic bronchitic change, stable. Lungs are otherwise clear. No evidence of a pleural effusion. No pneumothorax. Skeletal structures are grossly intact. IMPRESSION: No active disease. Electronically Signed   By: Amie Portlandavid  Ormond M.D.   On: 03/01/2019 13:49    Procedures Procedures (including critical care time)  Medications Ordered in ED Medications  aspirin chewable tablet 162 mg (162 mg Oral Given 03/01/19 1414)  nitroGLYCERIN (NITROSTAT) SL tablet 0.4 mg (0.4 mg Sublingual Given 03/01/19 1415)     Initial Impression / Assessment and Plan / ED Course  I have reviewed the triage vital signs and the nursing notes.  Pertinent labs & imaging results that were available during my care of the patient were reviewed by me and considered in my medical decision making (see chart for details).  She presents with 3 days of pain radiating into her left arm, over the past 2 days she is also had left-sided chest pressure that is worse with exertion.  No associated shortness of breath, nausea vomiting or diaphoresis.  History of previous MI, left heart cath in 2015 showed 30% blockages.  She was sent by her cardiologist today for further evaluation.  EKG shows left bundle blanch block, no other changes noted. Will  give nitro and aspirin.  HEART Score: 7  EKG shows sinus rhythm with left bundle branch block, no acute changes when compared to previous.  No leukocytosis and normal hemoglobin, no  acute electrolyte derangements and normal renal function.  Initial high-sensitivity troponin is 6 which is reassuring although patient story is still suggestive of unstable angina and her chest pain was relieved by nitro, she does still have some pain in her left arm.  Delta troponin is 12.  Despite reassuring troponins I am still very concerned by patient's chest pain story, and her cardiologist was concerned as well.  Will discuss with cardiology for potential admission.  Case discussed with Dr. Anne Fu with cardiology at [facility] who agrees that this is likely unstable angina and patient would benefit from heart catheterization, she will require premedication for this due to IV contrast allergy.  Dr. Anne Fu will admit patient to cardiology service, she will be transferred via [facility].  She is currently chest pain-free but still has some pain in her left arm. Dr. Anne Fu does not recommend heparin at this time given normal troponins.  Discussed plan with patient and she is in agreement.  Final Clinical Impressions(s) / ED Diagnoses   Final diagnoses:  Unstable angina [facility])    ED Discharge Orders    None       Dartha Lodge, [place] 03/01/19 1716    Little, Ambrose Finland, MD 03/03/19 [phone]

## 2019-03-01 NOTE — Progress Notes (Signed)
Cardiology Office Note:    Date:  03/01/2019   ID:  Kaitlyn Jimenez, DOB April 23, 1951, MRN [id number]  PCP:  Guadalupe MapleGage, John F., MD  Cardiologist:  Gypsy Balsamobert Devani Odonnel, MD    Referring MD: Guadalupe MapleGage, John F., MD   Chief Complaint  Patient presents with  . Chest Pain    x3 days  . Hypertension  Have a chest pain  History of Present Illness:    Kaitlyn Jimenez is a 68 y.o. female history of coronary artery disease status post PTCA and stenting done in 2007 2013.  Last cardiac catheterization done in 2015 which showed only 30% blockages.  She is allergic to IV dye.  She requested to be seen today because she complained of having left arm pain that is going on continuously for last 3 days.  She tells me last time when she had a heart attack she got exactly the same presentation.  On top of that since this morning she complained of having some tightness in the chest.  She also describes situation when she tried to walk around and walking uphill and she will get tightness in the chest that been going on for last few months.  At the moment of my interview she complained of very mild sensation in the chest.  EKG done today showed left bundle branch block which is chronic.  She is not sweating she does not have shortness of breath but she said if she got significant pain she will get sweating.  Past Medical History:  Diagnosis Date  . Hypertension   . MI (myocardial infarction) (HCC)   . MI (myocardial infarction) (HCC)   . Thyroid disease     Past Surgical History:  Procedure Laterality Date  . ABDOMINAL HYSTERECTOMY    . CARDIAC CATHETERIZATION    . CHOLECYSTECTOMY      Current Medications: Current Meds  Medication Sig  . ALPRAZolam (XANAX) 1 MG tablet Take 1 mg 2 (two) times daily as needed by mouth for anxiety.  Marland Kitchen. aspirin EC 81 MG tablet Take 324 mg daily by mouth.  . cetirizine (ZYRTEC) 10 MG tablet Take 10 mg daily by mouth.  . EPINEPHrine 0.3 mg/0.3 mL IJ SOAJ injection Inject 0.3 mg once  into the muscle.  . famotidine (PEPCID) 20 MG tablet Take 20 mg as needed by mouth for heartburn or indigestion.  Marland Kitchen. levothyroxine (SYNTHROID, LEVOTHROID) 100 MCG tablet Take 1 tablet daily by mouth.  . metoprolol tartrate (LOPRESSOR) 50 MG tablet Take 1 tablet (50 mg total) by mouth 2 (two) times daily.  . nitroGLYCERIN (NITROSTAT) 0.4 MG SL tablet Place 0.4 mg under the tongue every 5 (five) minutes as needed for chest pain.  . valsartan (DIOVAN) 160 MG tablet Take 1 tablet (160 mg total) by mouth 2 (two) times daily.     Allergies:   Penciclovir, Shellfish-derived products, Sulfa antibiotics, Contrast media [iodinated diagnostic agents], Penicillins, and Ramipril   Social History   Socioeconomic History  . Marital status: Married    Spouse name: Not on file  . Number of children: Not on file  . Years of education: Not on file  . Highest education level: Not on file  Occupational History  . Not on file  Social Needs  . Financial resource strain: Not on file  . Food insecurity    Worry: Not on file    Inability: Not on file  . Transportation needs    Medical: Not on file    Non-medical: Not on  file  Tobacco Use  . Smoking status: Former Games developer  . Smokeless tobacco: Never Used  Substance and Sexual Activity  . Alcohol use: No    Frequency: Never  . Drug use: No  . Sexual activity: Not on file  Lifestyle  . Physical activity    Days per week: Not on file    Minutes per session: Not on file  . Stress: Not on file  Relationships  . Social Musician on phone: Not on file    Gets together: Not on file    Attends religious service: Not on file    Active member of club or organization: Not on file    Attends meetings of clubs or organizations: Not on file    Relationship status: Not on file  Other Topics Concern  . Not on file  Social History Narrative  . Not on file     Family History: The patient's family history includes Arrhythmia in her sister;  Congestive Heart Failure in her brother; Heart attack in her brother and father; Heart disease in her maternal uncle, mother, and paternal uncle; Rheum arthritis in her brother. ROS:   Please see the history of present illness.    All 14 point review of systems negative except as described per history of present illness  EKGs/Labs/Other Studies Reviewed:      Recent Labs: No results found for requested labs within last 8760 hours.  Recent Lipid Panel No results found for: CHOL, TRIG, HDL, CHOLHDL, VLDL, LDLCALC, LDLDIRECT  Physical Exam:    VS:  BP (!) 200/76   Pulse 78   Ht 5\' 1"  (1.549 m)   Wt 178 lb 6.4 oz (80.9 kg)   SpO2 98%   BMI 33.71 kg/m     Wt Readings from Last 3 Encounters:  03/01/19 178 lb 6.4 oz (80.9 kg)  01/06/19 172 lb (78 kg)  10/08/18 174 lb 9.6 oz (79.2 kg)     GEN:  Well nourished, well developed in no acute distress HEENT: Normal NECK: No JVD; No carotid bruits LYMPHATICS: No lymphadenopathy CARDIAC: RRR, no murmurs, no rubs, no gallops RESPIRATORY:  Clear to auscultation without rales, wheezing or rhonchi  ABDOMEN: Soft, non-tender, non-distended MUSCULOSKELETAL:  No edema; No deformity  SKIN: Warm and dry LOWER EXTREMITIES: no swelling NEUROLOGIC:  Alert and oriented x 3 PSYCHIATRIC:  Normal affect   ASSESSMENT:    1. Coronary artery disease of native artery of native heart with stable angina pectoris (HCC)   2. Essential hypertension   3. Dyslipidemia   4. Atypical chest pain    PLAN:    In order of problems listed above:  1. Coronary disease status post PTCA and stenting done years ago came to the office with atypical chest pain.  However does also some very worrisome complaints of the pain including exertional pain.  I think the best course of action will be to admit her to the emergency room to rule her out.  She is allergic to dye so she need to be premedicated before cardiac catheterization. 2. Essential hypertension.  Blood  pressure today 200/76.  Again she will be admitted to emergency room and treated appropriately. 3. Dyslipidemia we initiated conversation about PCSK9 agent however because of her husband being very sick she did not have a chance to go to our clinic.   Medication Adjustments/Labs and Tests Ordered: Current medicines are reviewed at length with the patient today.  Concerns regarding medicines are outlined above.  No orders of the defined types were placed in this encounter.  Medication changes: No orders of the defined types were placed in this encounter.   Signed, Park Liter, MD, [facility] 03/01/2019 1:15 PM    [place]

## 2019-03-01 NOTE — ED Triage Notes (Addendum)
Chest pain x 2 days.    Radiates to left arm.   Pt states its really her arm that hurst.  No sob.  Worse on exertion.  No N/V.

## 2019-03-01 NOTE — Progress Notes (Signed)
ANTICOAGULATION CONSULT NOTE - Initial Consult  Pharmacy Consult for IV Heparin Indication: chest pain/ACS  Allergies  Allergen Reactions  . Penciclovir Hives  . Shellfish-Derived Products Other (See Comments)    Breathing difficulty  . Sulfa Antibiotics Hives  . Contrast Media [Iodinated Diagnostic Agents]     Breathing difficulty  . Penicillins   . Ramipril Nausea And Vomiting    pancreatis    Patient Measurements: Height: 5\' 1"  (154.9 cm) Weight: 175 lb 11.3 oz (79.7 kg) IBW/kg (Calculated) : 47.8 Heparin Dosing Weight: 66 kg  Vital Signs: Temp: 98.7 F (37.1 C) (07/07 1942) Temp Source: Oral (07/07 1942) BP: 172/73 (07/07 1942) Pulse Rate: 85 (07/07 1942)  Labs: Recent Labs    03/01/19 1341  HGB 14.0  HCT 43.5  PLT 276  CREATININE 0.77  TROPONINIHS 12  6    Estimated Creatinine Clearance: 64.4 mL/min (by C-G formula based on SCr of 0.77 mg/dL).   Medical History: Past Medical History:  Diagnosis Date  . Hypertension   . MI (myocardial infarction) ([place])   . MI (myocardial infarction) ([place])   . Thyroid disease    Assessment: 68 year old female with history of CAD and prior stenting who presents with chest pain to start IV Heparin per pharmacy dosing. She was not on anticoagulants prior to admission. CBC is within normal limits. SCr within normal limits. No bleeding reported.   Goal of Therapy:  Heparin level 0.3-0.7 units/ml Monitor platelets by anticoagulation protocol: Yes   Plan:  Heparin bolus of 4000 units x1.  Heparin drip at 800 units/hr.  Heparin level in 6 hours.  Daily Heparin level and CBC while on therapy.   Sloan Leiter, PharmD, BCPS, BCCCP Clinical Pharmacist Please refer to [facility] for [place] numbers 03/01/2019,8:12 PM

## 2019-03-02 ENCOUNTER — Inpatient Hospital Stay (HOSPITAL_COMMUNITY): Payer: Medicare HMO

## 2019-03-02 ENCOUNTER — Encounter (HOSPITAL_COMMUNITY)
Admission: EM | Disposition: A | Discharge status: Home / Self Care | Payer: Self-pay | Source: Home / Self Care | Attending: Cardiology

## 2019-03-02 DIAGNOSIS — R079 Chest pain, unspecified: Secondary | ICD-10-CM

## 2019-03-02 DIAGNOSIS — E785 Hyperlipidemia, unspecified: Secondary | ICD-10-CM

## 2019-03-02 DIAGNOSIS — Z789 Other specified health status: Secondary | ICD-10-CM

## 2019-03-02 HISTORY — PX: LEFT HEART CATH AND CORONARY ANGIOGRAPHY: CATH118249

## 2019-03-02 LAB — COMPREHENSIVE METABOLIC PANEL
ALT: 19 U/L (ref 0–44)
AST: 20 U/L (ref 15–41)
Albumin: 3.9 g/dL (ref 3.5–5.0)
Alkaline Phosphatase: 134 U/L — ABNORMAL HIGH (ref 38–126)
Anion gap: 10 (ref 5–15)
BUN: 7 mg/dL — ABNORMAL LOW (ref 8–23)
CO2: 22 mmol/L (ref 22–32)
Calcium: 9.4 mg/dL (ref 8.9–10.3)
Chloride: 104 mmol/L (ref 98–111)
Creatinine, Ser: 0.87 mg/dL (ref 0.44–1.00)
GFR calc Af Amer: >60 mL/min (ref >60)
GFR calc non Af Amer: >60 mL/min (ref >60)
Glucose, Bld: 166 mg/dL — ABNORMAL HIGH (ref 70–99)
Potassium: 4.1 mmol/L (ref 3.5–5.1)
Sodium: 136 mmol/L (ref 135–145)
Total Bilirubin: 0.7 mg/dL (ref 0.3–1.2)
Total Protein: 7.9 g/dL (ref 6.5–8.1)

## 2019-03-02 LAB — LIPID PANEL
Cholesterol: 252 mg/dL — ABNORMAL HIGH (ref 0–200)
HDL: 56 mg/dL (ref >40)
LDL Cholesterol: 186 mg/dL — ABNORMAL HIGH (ref 0–99)
Total CHOL/HDL Ratio: 4.5 RATIO
Triglycerides: 48 mg/dL (ref <150)
VLDL: 10 mg/dL (ref 0–40)

## 2019-03-02 LAB — HIV ANTIBODY (ROUTINE TESTING W REFLEX): HIV Screen 4th Generation wRfx: NONREACTIVE

## 2019-03-02 LAB — POCT ACTIVATED CLOTTING TIME
Activated Clotting Time: 263 seconds
Activated Clotting Time: 263 seconds

## 2019-03-02 LAB — HEPARIN LEVEL (UNFRACTIONATED): Heparin Unfractionated: 0.46 IU/mL (ref 0.30–0.70)

## 2019-03-02 LAB — TROPONIN I (HIGH SENSITIVITY): Troponin I (High Sensitivity): 16 ng/L (ref <18)

## 2019-03-02 LAB — ECHOCARDIOGRAM COMPLETE
Height: 61 in
Weight: 2811.31 oz

## 2019-03-02 SURGERY — LEFT HEART CATH AND CORONARY ANGIOGRAPHY
Anesthesia: LOCAL

## 2019-03-02 MED ORDER — HEPARIN (PORCINE) IN NACL 1000-0.9 UT/500ML-% IV SOLN
INTRAVENOUS | Status: DC | PRN
  Administered 2019-03-02: 500 mL

## 2019-03-02 MED ORDER — MORPHINE SULFATE (PF) 2 MG/ML IV SOLN: 2.0000 mg | INTRAVENOUS | Status: DC | PRN

## 2019-03-02 MED ORDER — FENTANYL CITRATE (PF) 100 MCG/2ML IJ SOLN
INTRAMUSCULAR | Status: AC
  Filled 2019-03-02: qty 2

## 2019-03-02 MED ORDER — HEPARIN (PORCINE) IN NACL 1000-0.9 UT/500ML-% IV SOLN
INTRAVENOUS | Status: AC
  Filled 2019-03-02: qty 1000

## 2019-03-02 MED ORDER — VERAPAMIL HCL 2.5 MG/ML IV SOLN
INTRAVENOUS | Status: DC | PRN
  Administered 2019-03-02: 10 mL via INTRA_ARTERIAL

## 2019-03-02 MED ORDER — LABETALOL HCL 5 MG/ML IV SOLN
INTRAVENOUS | Status: AC
  Filled 2019-03-02: qty 4

## 2019-03-02 MED ORDER — SODIUM CHLORIDE 0.9 % WEIGHT BASED INFUSION: 1.0000 mL/kg/h | INTRAVENOUS | Status: AC

## 2019-03-02 MED ORDER — MORPHINE SULFATE (PF) 10 MG/ML IV SOLN: 2.0000 mg | INTRAVENOUS | Status: DC | PRN

## 2019-03-02 MED ORDER — LABETALOL HCL 5 MG/ML IV SOLN
10.0000 mg | INTRAVENOUS | Status: AC | PRN
  Administered 2019-03-02: 10 mg via INTRAVENOUS

## 2019-03-02 MED ORDER — TICAGRELOR 90 MG PO TABS
ORAL_TABLET | ORAL | Status: DC | PRN
  Administered 2019-03-02: 180 mg via ORAL

## 2019-03-02 MED ORDER — HYDRALAZINE HCL 20 MG/ML IJ SOLN: 10.0000 mg | INTRAMUSCULAR | Status: AC | PRN

## 2019-03-02 MED ORDER — IOHEXOL 350 MG/ML SOLN
INTRAVENOUS | Status: DC | PRN
  Administered 2019-03-02: 120 mL via INTRA_ARTERIAL

## 2019-03-02 MED ORDER — LIDOCAINE HCL (PF) 1 % IJ SOLN
INTRAMUSCULAR | Status: AC
  Filled 2019-03-02: qty 30

## 2019-03-02 MED ORDER — LIDOCAINE HCL (PF) 1 % IJ SOLN
INTRAMUSCULAR | Status: DC | PRN
  Administered 2019-03-02: 2 mL

## 2019-03-02 MED ORDER — SODIUM CHLORIDE 0.9% FLUSH: 3.0000 mL | INTRAVENOUS | Status: DC | PRN

## 2019-03-02 MED ORDER — SODIUM CHLORIDE 0.9 % IV SOLN: 250.0000 mL | INTRAVENOUS | Status: DC | PRN

## 2019-03-02 MED ORDER — NITROGLYCERIN 1 MG/10 ML FOR IR/CATH LAB
INTRA_ARTERIAL | Status: AC
  Filled 2019-03-02: qty 10

## 2019-03-02 MED ORDER — MIDAZOLAM HCL 2 MG/2ML IJ SOLN
INTRAMUSCULAR | Status: AC
  Filled 2019-03-02: qty 2

## 2019-03-02 MED ORDER — FENTANYL CITRATE (PF) 100 MCG/2ML IJ SOLN
INTRAMUSCULAR | Status: DC | PRN
  Administered 2019-03-02: 25 ug via INTRAVENOUS
  Administered 2019-03-02: 50 ug via INTRAVENOUS
  Administered 2019-03-02: 25 ug via INTRAVENOUS

## 2019-03-02 MED ORDER — SODIUM CHLORIDE 0.9% FLUSH
3.0000 mL | Freq: Two times a day (BID) | INTRAVENOUS | Status: DC
  Administered 2019-03-03: 3 mL via INTRAVENOUS

## 2019-03-02 MED ORDER — TICAGRELOR 90 MG PO TABS
90.0000 mg | ORAL_TABLET | Freq: Two times a day (BID) | ORAL | Status: DC
  Administered 2019-03-02 – 2019-03-03 (×2): 90 mg via ORAL
  Filled 2019-03-02 (×2): qty 1

## 2019-03-02 MED ORDER — NITROGLYCERIN 1 MG/10 ML FOR IR/CATH LAB
INTRA_ARTERIAL | Status: DC | PRN
  Administered 2019-03-02 (×3): 150 ug via INTRACORONARY

## 2019-03-02 MED ORDER — VERAPAMIL HCL 2.5 MG/ML IV SOLN
INTRAVENOUS | Status: AC
  Filled 2019-03-02: qty 2

## 2019-03-02 MED ORDER — MIDAZOLAM HCL 2 MG/2ML IJ SOLN
INTRAMUSCULAR | Status: DC | PRN
  Administered 2019-03-02 (×2): 1 mg via INTRAVENOUS
  Administered 2019-03-02: 2 mg via INTRAVENOUS

## 2019-03-02 MED ORDER — HEPARIN SODIUM (PORCINE) 1000 UNIT/ML IJ SOLN
INTRAMUSCULAR | Status: AC
  Filled 2019-03-02: qty 1

## 2019-03-02 MED ORDER — HEPARIN SODIUM (PORCINE) 1000 UNIT/ML IJ SOLN
INTRAMUSCULAR | Status: DC | PRN
  Administered 2019-03-02: 3000 [IU] via INTRAVENOUS
  Administered 2019-03-02: 2000 [IU] via INTRAVENOUS
  Administered 2019-03-02: 4000 [IU] via INTRAVENOUS

## 2019-03-02 SURGICAL SUPPLY — 20 items
BALLN SAPPHIRE 2.5X15 (BALLOONS) ×2
BALLN SAPPHIRE ~~LOC~~ 3.25X15 (BALLOONS) ×1 IMPLANT
BALLOON SAPPHIRE 2.5X15 (BALLOONS) IMPLANT
CATH 5FR JL3.5 JR4 ANG PIG MP (CATHETERS) ×1 IMPLANT
CATH INFINITI JR4 5F (CATHETERS) ×1 IMPLANT
CATH LAUNCHER 5F AL1 (CATHETERS) IMPLANT
CATHETER LAUNCHER 5F AL1 (CATHETERS) ×2
COVER DOME SNAP 22 D (MISCELLANEOUS) ×1 IMPLANT
DEVICE RAD COMP TR BAND LRG (VASCULAR PRODUCTS) ×1 IMPLANT
GLIDESHEATH SLEND SS 6F .021 (SHEATH) ×1 IMPLANT
GUIDEWIRE INQWIRE 1.5J.035X260 (WIRE) IMPLANT
INQWIRE 1.5J .035X260CM (WIRE) ×2
KIT ENCORE 26 ADVANTAGE (KITS) ×1 IMPLANT
KIT HEART LEFT (KITS) ×2 IMPLANT
PACK CARDIAC CATHETERIZATION (CUSTOM PROCEDURE TRAY) ×2 IMPLANT
STENT SYNERGY DES 3X32 (Permanent Stent) ×1 IMPLANT
SYR MEDRAD MARK 7 150ML (SYRINGE) ×2 IMPLANT
TRANSDUCER W/STOPCOCK (MISCELLANEOUS) ×2 IMPLANT
TUBING CIL FLEX 10 FLL-RA (TUBING) ×2 IMPLANT
WIRE COUGAR XT STRL 190CM (WIRE) ×1 IMPLANT

## 2019-03-02 NOTE — TOC Benefit Eligibility Note (Signed)
Transition of Care [facility]) Benefit Eligibility Note    Patient Details  Name: Kaitlyn Jimenez MRN: [id number] Date of Birth: 1951/02/15   Medication/Dose: BRILINTA  90 MG BID  Covered?: Yes  Tier: 3 Drug  Prescription Coverage Preferred Pharmacy: [place] with Person/Company/Phone Number:: [facility] @  [employer] [place] # [phone]  Co-Pay: $ 45.00  Prior Approval: No  Deductible: Met       Memory [place] Phone Number: 03/02/2019, 4:55 PM

## 2019-03-02 NOTE — Progress Notes (Signed)
    Patient came from the procedural area post PCI, with a L arm pain of seven. She was kept in HA until this pain gets resolved. Her BP was also high upon her arrival 204/77, and this was treated with Labetolol. Her L arm pain has been gradually improving, and it is at level zero now.

## 2019-03-02 NOTE — H&P (View-Only) (Signed)
Progress Note  Patient Name: Kaitlyn Jimenez Date of Encounter: 03/02/2019  Primary Cardiologist: Gypsy Balsam, MD   Subjective   Currently CP free. A little anxious about cath. She reports h/o generalized anxiety and takes xanax at home.   Inpatient Medications    Scheduled Meds: . aspirin EC  81 mg Oral Daily  . irbesartan  150 mg Oral Daily  . levothyroxine  100 mcg Oral Daily  . loratadine  10 mg Oral Daily  . metoprolol tartrate  50 mg Oral BID  . sodium chloride flush  3 mL Intravenous Q12H  . sodium chloride flush  3 mL Intravenous Q12H   Continuous Infusions: . sodium chloride 80 mL/hr at 03/02/19 0600  . sodium chloride    . sodium chloride 1 mL/kg/hr (03/02/19 0500)  . heparin 800 Units/hr (03/02/19 0600)   PRN Meds: sodium chloride, sodium chloride, acetaminophen, ALPRAZolam, famotidine, nitroGLYCERIN, ondansetron (ZOFRAN) IV, sodium chloride flush, sodium chloride flush, zolpidem   Vital Signs    Vitals:   03/01/19 2254 03/02/19 0546 03/02/19 0553 03/02/19 0555  BP: (!) 156/74 (!) 174/79  (!) 163/81  Pulse:   70   Resp: 20  18   Temp:   98.5 F (36.9 C)   TempSrc:   Oral   SpO2: 99%  96%   Weight:    79.7 kg  Height:        Intake/Output Summary (Last 24 hours) at 03/02/2019 0747 Last data filed at 03/02/2019 0600 Gross per 24 hour  Intake 742.86 ml  Output 300 ml  Net 442.86 ml   Last 3 Weights 03/02/2019 03/01/2019 03/01/2019  Weight (lbs) 175 lb 11.3 oz 175 lb 11.3 oz 178 lb 5.6 oz  Weight (kg) 79.7 kg 79.7 kg 80.9 kg      Telemetry    SR w/ LBBB 90s- Personally Reviewed  ECG    SR w/ sinus arrhthymia Chronic LBBB, 68 bpm - Personally Reviewed  Physical Exam   GEN: No acute distress.   Neck: No JVD Cardiac: RRR, no murmurs, rubs, or gallops.  Respiratory: Clear to auscultation bilaterally. GI: Soft, nontender, non-distended  MS: No edema; No deformity. Neuro:  Nonfocal  Psych: Normal affect   Labs    High Sensitivity Troponin:    Recent Labs  Lab 03/01/19 1341 03/01/19 2014 03/01/19 2249  TROPONINIHS 12  6 13 16       Cardiac EnzymesNo results for input(s): TROPONINI in the last 168 hours. No results for input(s): TROPIPOC in the last 168 hours.   Chemistry Recent Labs  Lab 03/01/19 1341 03/02/19 0253  NA 135 136  K 4.2 4.1  CL 102 104  CO2 24 22  GLUCOSE 108* 166*  BUN 12 7*  CREATININE 0.77 0.87  CALCIUM 9.4 9.4  PROT  --  7.9  ALBUMIN  --  3.9  AST  --  20  ALT  --  19  ALKPHOS  --  134*  BILITOT  --  0.7  GFRNONAA >60 >60  GFRAA >60 >60  ANIONGAP 9 10     Hematology Recent Labs  Lab 03/01/19 1341  WBC 8.0  RBC 4.77  HGB 14.0  HCT 43.5  MCV 91.2  MCH 29.4  MCHC 32.2  RDW 12.7  PLT 276    BNPNo results for input(s): BNP, PROBNP in the last 168 hours.   DDimer No results for input(s): DDIMER in the last 168 hours.   Radiology    Dg Chest [facility]  1 View  Result Date: 03/01/2019 CLINICAL DATA:  Pt c/o chest pain, pressure and left arm pain since this am, hx MI in 2013, HTN, cardiac cath. 3 stents EXAM: PORTABLE CHEST 1 VIEW COMPARISON:  03/05/2017 FINDINGS: Cardiac silhouette is normal in size and configuration. No mediastinal or hilar masses. No evidence of adenopathy. Streaky opacities noted in the medial lung bases consistent with chronic bronchitic change, stable. Lungs are otherwise clear. No evidence of a pleural effusion. No pneumothorax. Skeletal structures are grossly intact. IMPRESSION: No active disease. Electronically Signed   By: Lajean Manes M.D.   On: 03/01/2019 13:49    Cardiac Studies   2D Echo 07/24/2017 Study Conclusions  - Left ventricle: The cavity size was normal. Systolic function was   normal. The estimated ejection fraction was in the range of 60%   to 65%. Wall motion was normal; there were no regional wall   motion abnormalities. - Aortic valve: Valve area (VTI): 2.25 cm^2. Valve area (Vmax):   1.96 cm^2. Valve area (Vmean): 2.23 cm^2.   Impressions:  - Preserved systolic function and impaired relaxation.  Patient Profile     Ms. Kaitlyn Jimenez 68 yo female history of CAD, PSVT, HTN, HL, chronic LBBB admitted with chest pain concerning for unstable angina.   Assessment & Plan    1. CAD w/ Unstable Angina: we do not have access to prior caths in our system nor descriptions of anatomy in prior clinic notes. From clinic notes reported, she does have  history of PCI in 2007 and 2013. Last cath in 2015 with mild nonobstructive disease. She presented 03/01/19 with symptoms similar to previous angina associated w/ her MI in 2013, including exertional chest tightness and left arm pain, relieved with rest.   - She has ruled out for MI. HsTroponin normal x 4 ( 6, 12, 13, 16 ng/L)  - EKG shows chronic LBBB  - Plan LHC to exclude high grade CAD/ coronary ischemia  - She is NPO. Renal function WNL. SCr is 0.87.   - I have reviewed the risks, indications, and alternatives to cardiac catheterization and possible angioplasty/stenting with the patient. Risks include but are not limited to bleeding, infection, vascular injury, stroke, myocardial infection, arrhythmia, kidney injury, radiation-related injury in the case of prolonged fluoroscopy use, emergency cardiac surgery, and death. The patient understands the risks of serious complication is low (<7%).   - She has a contrast dye allergy and is being premedicated. She has received prednisone and diphenhydramine and scheduled to get famotidine.   - COVID negative  - Continue ASA,  blocker and ARB. She is not on statin therapy due to h/o intolerance but will need to consider other lipid lowering therapies.   2. HLD: Lipid panel today shows severely elevated LDL at 186 mg/dL. Her LDL goal given established h/o CAD is < 70 mg/dL. She is not currently on statin therapy given reported h/o statin intolerance. She did not tolerate Lipitor, Crestor nor Pravastatin. I do not think that addition of Zetia alone  will get her to recommended goal. Will need to discuss statin retrial. If pt is unwilling, will need outpatient referral to [facility] for [facility] therapy.   3. HTN: BP has been elevated this admission. BP goal <130/80. Continue home ARB and  blocker. With resting HRs in the 90s, could further titrate metoprolol to 75-100 mg BID. Will monitor pressures closely throughout the day.   4. Contrast Dye Allergy: she is being premedicated. She has received prednisone and  diphenhydramine and scheduled to get famotidine. Will monitor closely post cath.   5. Generalized Anxiety:  Reports long h/o this and takes Xanax at home. She is feeling a bit anxious about cath. May need anoxiolytic during procedure.   For questions or updates, please contact [facility] Please consult [email or web address] for contact info under        Signed, Brittainy Simmons, PA-C  03/02/2019, 7:47 AM    

## 2019-03-02 NOTE — Interval H&P Note (Signed)
Cath Lab Visit (complete for each Cath Lab visit)  Clinical Evaluation Leading to the Procedure:   ACS: Yes.    Non-ACS:    Anginal Classification: CCS IV  Anti-ischemic medical therapy: Minimal Therapy (1 class of medications)  Non-Invasive Test Results: No non-invasive testing performed  Prior CABG: No previous CABG      History and Physical Interval Note:  03/02/2019 11:40 AM  Werner Lean  has presented today for surgery, with the diagnosis of unstable angina.  The various methods of treatment have been discussed with the patient and family. After consideration of risks, benefits and other options for treatment, the patient has consented to  Procedure(s): LEFT HEART CATH AND CORONARY ANGIOGRAPHY (N/A) as a surgical intervention.  The patient's history has been reviewed, patient examined, no change in status, stable for surgery.  I have reviewed the patient's chart and labs.  Questions were answered to the patient's satisfaction.     Sherren Mocha

## 2019-03-02 NOTE — Progress Notes (Signed)
TR BAND REMOVAL  LOCATION:    right radial  DEFLATED PER PROTOCOL:    Yes.    TIME BAND OFF / DRESSING APPLIED:    21:15   SITE UPON ARRIVAL:    Level 0  SITE AFTER BAND REMOVAL:    Level 0  CIRCULATION SENSATION AND MOVEMENT:    Within Normal Limits   Yes.    COMMENTS:   Post TR band instructions given. Pt tolerated well. 

## 2019-03-02 NOTE — Progress Notes (Signed)
  Echocardiogram 2D Echocardiogram has been performed.  Randa Lynn Saquoia Sianez 03/02/2019, 5:26 PM

## 2019-03-02 NOTE — Progress Notes (Signed)
[place] for IV Heparin Indication: chest pain/ACS  Allergies  Allergen Reactions  . Penciclovir Hives  . Shellfish-Derived Products Other (See Comments)    Breathing difficulty  . Sulfa Antibiotics Hives  . Contrast Media [Iodinated Diagnostic Agents]     Breathing difficulty  . Penicillins   . Ramipril Nausea And Vomiting    pancreatis    Patient Measurements: Height: 5\' 1"  (154.9 cm) Weight: 175 lb 11.3 oz (79.7 kg) IBW/kg (Calculated) : 47.8 Heparin Dosing Weight: 66 kg  Vital Signs: Temp: 98.7 F (37.1 C) (07/07 1942) Temp Source: Oral (07/07 1942) BP: 156/74 (07/07 2254) Pulse Rate: 85 (07/07 1942)  Labs: Recent Labs    03/01/19 1341 03/01/19 2014 03/01/19 2249 03/02/19 0253  HGB 14.0  --   --   --   HCT 43.5  --   --   --   PLT 276  --   --   --   LABPROT  --  13.2  --   --   INR  --  1.0  --   --   HEPARINUNFRC  --   --   --  0.46  CREATININE 0.77  --   --  0.87  TROPONINIHS 12  6 13 16   --     Estimated Creatinine Clearance: 59.2 mL/min (by C-G formula based on SCr of 0.87 mg/dL).   Medical History: Past Medical History:  Diagnosis Date  . Hypertension   . MI (myocardial infarction) ([place])   . MI (myocardial infarction) ([place])   . Thyroid disease    Assessment: 68 year old female with history of CAD and prior stenting who presents with chest pain to start IV Heparin per pharmacy dosing. She was not on anticoagulants prior to admission. CBC is within normal limits. SCr within normal limits. No bleeding reported.   7/8 AM update:  Initial heparin level therapeutic   Goal of Therapy:  Heparin level 0.3-0.7 units/ml Monitor platelets by anticoagulation protocol: Yes   Plan:  Cont Heparin drip at 800 units/hr.  Confirmatory Heparin level in 6-8 hours.  Daily Heparin level and CBC while on therapy.   Narda Bonds, PharmD, BCPS Clinical Pharmacist Phone: [phone]

## 2019-03-02 NOTE — Progress Notes (Signed)
Progress Note  Patient Name: Kaitlyn Jimenez Date of Encounter: 03/02/2019  Primary Cardiologist: Gypsy Balsam, MD   Subjective   Currently CP free. A little anxious about cath. She reports h/o generalized anxiety and takes xanax at home.   Inpatient Medications    Scheduled Meds: . aspirin EC  81 mg Oral Daily  . irbesartan  150 mg Oral Daily  . levothyroxine  100 mcg Oral Daily  . loratadine  10 mg Oral Daily  . metoprolol tartrate  50 mg Oral BID  . sodium chloride flush  3 mL Intravenous Q12H  . sodium chloride flush  3 mL Intravenous Q12H   Continuous Infusions: . sodium chloride 80 mL/hr at 03/02/19 0600  . sodium chloride    . sodium chloride 1 mL/kg/hr (03/02/19 0500)  . heparin 800 Units/hr (03/02/19 0600)   PRN Meds: sodium chloride, sodium chloride, acetaminophen, ALPRAZolam, famotidine, nitroGLYCERIN, ondansetron (ZOFRAN) IV, sodium chloride flush, sodium chloride flush, zolpidem   Vital Signs    Vitals:   03/01/19 2254 03/02/19 0546 03/02/19 0553 03/02/19 0555  BP: (!) 156/74 (!) 174/79  (!) 163/81  Pulse:   70   Resp: 20  18   Temp:   98.5 F (36.9 C)   TempSrc:   Oral   SpO2: 99%  96%   Weight:    79.7 kg  Height:        Intake/Output Summary (Last 24 hours) at 03/02/2019 0747 Last data filed at 03/02/2019 0600 Gross per 24 hour  Intake 742.86 ml  Output 300 ml  Net 442.86 ml   Last 3 Weights 03/02/2019 03/01/2019 03/01/2019  Weight (lbs) 175 lb 11.3 oz 175 lb 11.3 oz 178 lb 5.6 oz  Weight (kg) 79.7 kg 79.7 kg 80.9 kg      Telemetry    SR w/ LBBB 90s- Personally Reviewed  ECG    SR w/ sinus arrhthymia Chronic LBBB, 68 bpm - Personally Reviewed  Physical Exam   GEN: No acute distress.   Neck: No JVD Cardiac: RRR, no murmurs, rubs, or gallops.  Respiratory: Clear to auscultation bilaterally. GI: Soft, nontender, non-distended  MS: No edema; No deformity. Neuro:  Nonfocal  Psych: Normal affect   Labs    High Sensitivity Troponin:    Recent Labs  Lab 03/01/19 1341 03/01/19 2014 03/01/19 2249  TROPONINIHS 12  6 13 16       Cardiac EnzymesNo results for input(s): TROPONINI in the last 168 hours. No results for input(s): TROPIPOC in the last 168 hours.   Chemistry Recent Labs  Lab 03/01/19 1341 03/02/19 0253  NA 135 136  K 4.2 4.1  CL 102 104  CO2 24 22  GLUCOSE 108* 166*  BUN 12 7*  CREATININE 0.77 0.87  CALCIUM 9.4 9.4  PROT  --  7.9  ALBUMIN  --  3.9  AST  --  20  ALT  --  19  ALKPHOS  --  134*  BILITOT  --  0.7  GFRNONAA >60 >60  GFRAA >60 >60  ANIONGAP 9 10     Hematology Recent Labs  Lab 03/01/19 1341  WBC 8.0  RBC 4.77  HGB 14.0  HCT 43.5  MCV 91.2  MCH 29.4  MCHC 32.2  RDW 12.7  PLT 276    BNPNo results for input(s): BNP, PROBNP in the last 168 hours.   DDimer No results for input(s): DDIMER in the last 168 hours.   Radiology    Dg Chest [facility]  1 View  Result Date: 03/01/2019 CLINICAL DATA:  Pt c/o chest pain, pressure and left arm pain since this am, hx MI in 2013, HTN, cardiac cath. 3 stents EXAM: PORTABLE CHEST 1 VIEW COMPARISON:  03/05/2017 FINDINGS: Cardiac silhouette is normal in size and configuration. No mediastinal or hilar masses. No evidence of adenopathy. Streaky opacities noted in the medial lung bases consistent with chronic bronchitic change, stable. Lungs are otherwise clear. No evidence of a pleural effusion. No pneumothorax. Skeletal structures are grossly intact. IMPRESSION: No active disease. Electronically Signed   By: Lajean Manes M.D.   On: 03/01/2019 13:49    Cardiac Studies   2D Echo 07/24/2017 Study Conclusions  - Left ventricle: The cavity size was normal. Systolic function was   normal. The estimated ejection fraction was in the range of 60%   to 65%. Wall motion was normal; there were no regional wall   motion abnormalities. - Aortic valve: Valve area (VTI): 2.25 cm^2. Valve area (Vmax):   1.96 cm^2. Valve area (Vmean): 2.23 cm^2.   Impressions:  - Preserved systolic function and impaired relaxation.  Patient Profile     Kaitlyn Jimenez 68 yo female history of CAD, PSVT, HTN, HL, chronic LBBB admitted with chest pain concerning for unstable angina.   Assessment & Plan    1. CAD w/ Unstable Angina: we do not have access to prior caths in our system nor descriptions of anatomy in prior clinic notes. From clinic notes reported, she does have  history of PCI in 2007 and 2013. Last cath in 2015 with mild nonobstructive disease. She presented 03/01/19 with symptoms similar to previous angina associated w/ her MI in 2013, including exertional chest tightness and left arm pain, relieved with rest.   - She has ruled out for MI. HsTroponin normal x 4 ( 6, 12, 13, 16 ng/L)  - EKG shows chronic LBBB  - Plan LHC to exclude high grade CAD/ coronary ischemia  - She is NPO. Renal function WNL. SCr is 0.87.   - I have reviewed the risks, indications, and alternatives to cardiac catheterization and possible angioplasty/stenting with the patient. Risks include but are not limited to bleeding, infection, vascular injury, stroke, myocardial infection, arrhythmia, kidney injury, radiation-related injury in the case of prolonged fluoroscopy use, emergency cardiac surgery, and death. The patient understands the risks of serious complication is low (<7%).   - She has a contrast dye allergy and is being premedicated. She has received prednisone and diphenhydramine and scheduled to get famotidine.   - COVID negative  - Continue ASA,  blocker and ARB. She is not on statin therapy due to h/o intolerance but will need to consider other lipid lowering therapies.   2. HLD: Lipid panel today shows severely elevated LDL at 186 mg/dL. Her LDL goal given established h/o CAD is < 70 mg/dL. She is not currently on statin therapy given reported h/o statin intolerance. She did not tolerate Lipitor, Crestor nor Pravastatin. I do not think that addition of Zetia alone  will get her to recommended goal. Will need to discuss statin retrial. If pt is unwilling, will need outpatient referral to [facility] for [facility] therapy.   3. HTN: BP has been elevated this admission. BP goal <130/80. Continue home ARB and  blocker. With resting HRs in the 90s, could further titrate metoprolol to 75-100 mg BID. Will monitor pressures closely throughout the day.   4. Contrast Dye Allergy: she is being premedicated. She has received prednisone and  diphenhydramine and scheduled to get famotidine. Will monitor closely post cath.   5. Generalized Anxiety:  Reports long h/o this and takes Xanax at home. She is feeling a bit anxious about cath. May need anoxiolytic during procedure.   For questions or updates, please contact [facility] Please consult [email or web address] for contact info under        Signed, Robbie LisBrittainy Cabrini Ruggieri, PA-C  03/02/2019, 7:47 AM

## 2019-03-03 ENCOUNTER — Encounter (HOSPITAL_COMMUNITY): Payer: Self-pay | Admitting: Cardiovascular Disease

## 2019-03-03 ENCOUNTER — Telehealth: Payer: Self-pay | Admitting: *Deleted

## 2019-03-03 DIAGNOSIS — I10 Essential (primary) hypertension: Secondary | ICD-10-CM

## 2019-03-03 DIAGNOSIS — Z955 Presence of coronary angioplasty implant and graft: Secondary | ICD-10-CM

## 2019-03-03 DIAGNOSIS — I25110 Atherosclerotic heart disease of native coronary artery with unstable angina pectoris: Secondary | ICD-10-CM

## 2019-03-03 LAB — BASIC METABOLIC PANEL
Anion gap: 10 (ref 5–15)
BUN: 13 mg/dL (ref 8–23)
CO2: 22 mmol/L (ref 22–32)
Calcium: 8.9 mg/dL (ref 8.9–10.3)
Chloride: 104 mmol/L (ref 98–111)
Creatinine, Ser: 0.97 mg/dL (ref 0.44–1.00)
GFR calc Af Amer: >60 mL/min (ref >60)
GFR calc non Af Amer: >60 mL/min (ref >60)
Glucose, Bld: 110 mg/dL — ABNORMAL HIGH (ref 70–99)
Potassium: 4.4 mmol/L (ref 3.5–5.1)
Sodium: 136 mmol/L (ref 135–145)

## 2019-03-03 LAB — CBC
HCT: 42.4 % (ref 36.0–46.0)
Hemoglobin: 13.6 g/dL (ref 12.0–15.0)
MCH: 29.8 pg (ref 26.0–34.0)
MCHC: 32.1 g/dL (ref 30.0–36.0)
MCV: 92.8 fL (ref 80.0–100.0)
Platelets: 319 10*3/uL (ref 150–400)
RBC: 4.57 MIL/uL (ref 3.87–5.11)
RDW: 13.1 % (ref 11.5–15.5)
WBC: 19.8 10*3/uL — ABNORMAL HIGH (ref 4.0–10.5)
nRBC: 0 % (ref 0.0–0.2)

## 2019-03-03 MED ORDER — TICAGRELOR 90 MG PO TABS: 90.0000 mg | ORAL_TABLET | Freq: Two times a day (BID) | ORAL | 3 refills | Status: DC

## 2019-03-03 MED ORDER — NITROGLYCERIN 0.4 MG SL SUBL: 0.4000 mg | SUBLINGUAL_TABLET | SUBLINGUAL | 2 refills | Status: AC | PRN

## 2019-03-03 MED ORDER — TICAGRELOR 90 MG PO TABS: 90.0000 mg | ORAL_TABLET | Freq: Two times a day (BID) | ORAL | 0 refills | Status: DC

## 2019-03-03 MED ORDER — ASPIRIN 81 MG PO TBEC: 81.0000 mg | DELAYED_RELEASE_TABLET | Freq: Every day | ORAL | Status: DC

## 2019-03-03 NOTE — Progress Notes (Signed)
RACARDIAC REHAB PHASE I   PRE:  Rate/Rhythm: 77 SR  BP:  Sitting: 173/82       SaO2: 99 RA  MODE:  Ambulation: 270 ft   POST:  Rate/Rhythm: 95 SR  BP:  Sitting: 172/81    SaO2: 99 RA   Pt ambulated 259ft in hallway independently with steady gait. Pt denies CP or SOB. Pt educated on importance of ASA, Brilinta, and NTG. Pt given heart healthy diet. Reviewed restrictions, site care, and exercise guidelines. Will refer to CRP II [place].  [id number] Rufina Falco, RN BSN 03/03/2019 9:30 AM

## 2019-03-03 NOTE — Discharge Summary (Signed)
Discharge Summary    Patient ID: Kaitlyn Jimenez MRN: [id number]; DOB: [birth year]  Admit date: 03/01/2019 Discharge date: 03/03/2019  Primary Care Provider: Guadalupe MapleGage, John F., MD  Primary Cardiologist: Gypsy Balsamobert Krasowski, MD  Primary Electrophysiologist:  None   Patient Profile     [name] yo female history of CAD, PSVT, HTN, HLD w/ statin intolerance chronic LBBB, contrast dye allergy and FH of premature CAD in father and brother,  admitted with chest pain concerning for unstable angina.   Discharge Diagnoses    Active Problems:   Coronary artery disease Stenting in 2007, 2013 and 2020    Essential hypertension   Dyslipidemia   Allergies Allergies  Allergen Reactions   Bee Venom Anaphylaxis   Penciclovir Hives   Shellfish-Derived Products Other (See Comments)    Breathing difficulty   Sulfa Antibiotics Hives   Contrast Media [Iodinated Diagnostic Agents]     Breathing difficulty   Penicillins Hives and Rash    Did it involve swelling of the face/tongue/throat, SOB, or low BP? No Did it involve sudden or severe rash/hives, skin peeling, or any reaction on the inside of your mouth or nose? Yes Did you need to seek medical attention at a hospital or doctor's office? No When did it last happen?Pt was a child  If all above answers are NO, may proceed with cephalosporin use.    Ramipril Nausea And Vomiting    pancreatis    Diagnostic Studies/Procedures    [facility]HC 03/02/19 LEFT HEART CATH AND CORONARY ANGIOGRAPHY  Conclusion  1. Multivessel CAD with moderate 50% diffuse LAD in-stent restenosis, patency of the left circumflex, and severe stenosis of the proximal RCA 2. 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 Successful PCI of the RCA with a 3.0x32 mm Synergy DES  Recommend: ASA/ticagrelor at least 12 months, aggressive medical therapy   Coronary Diagrams  Diagnostic Dominance: Right  Intervention     Wall Motion         Resting       The mid anterior, mid inferior, basilar anterior, basilar inferior and apical anterior segments are normal. The apical inferior segment is dyskinetic.           Left Heart  Left Ventricle The left ventricular size is normal. The left ventricular systolic function is normal. LV end diastolic pressure is normal. The left ventricular ejection fraction is 55-65% by visual estimate. There are LV function abnormalities due to segmental dysfunction. 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 The left ventricle has normal systolic function with an ejection fraction of 60-65%. The cavity size was normal. Left ventricular diastolic function could not be evaluated due to nondiagnostic images. No evidence of left ventricular regional wall  motion abnormalities. 2. The right ventricle has normal systolic function. The cavity was normal. There is no increase in right ventricular wall thickness. 3. The aortic valve is tricuspid. Mild sclerosis of the aortic valve. Aortic valve regurgitation was not assessed by color flow Doppler.     History of Present Illness     [name] yo female history of CAD, PSVT, HTN, HLD w/ statin intolerance, chronic LBBB, contrast dye allergy and FH of premature CAD in father and brother, admitted with chest pain concerning for unstable angina. She is followed by Dr. Bing MatterKrasowski and has prior h/o MI w/ coronary stenting to the LAD in 2007 and 2013 [facility]. Repeat  cath in 2015 showed nonobstructive CAD.   She [facility] on  03/01/19 with symptoms similar to previous angina associated w/ her MI in 2013, including new exertional chest tightness and left arm pain, relieved with rest. She was admitted for unstable angina and plans for cardiac catheterization.   Hospital Course     Pt was admitted to telemetry. She ruled out for MI.   HsTroponin normal x 4 ( 6, 12, 13, 16 ng/L). EKG showed chronic LBBB. Prior to cath, she was premedicated for contrast dye allergy and received prednisone, diphenhydramine and famotidine. Cardiac cath was performed by Dr. Excell Seltzerooper on 03/02/19. Access obtained via the right radial artery. 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 EF 55-65%. She underwent successful PCI of the RCA with a 3.0x32 mm Synergy DES. She tolerated the procedure well however, during post cath recovery, she became hypertensive w/ SBPs in the low 200s and developed CP. She was given IV labetalol with improvement of BP and complete resolution of CP. She did not develop any allergic reactions. She was transferred back to tele unit for further post cath care and observation. She had no further complications. No recurrent CP or dyspnea. Renal function, H/H and VS remained stable. No arrhthymias on telemetry. She ambulated w/o difficulty. Also of note, a FLP was obtained during admission, which demonstrated severely elevated LDL at 186 mg/dL. Her LDL goal given established h/o CAD is <70 mg/dL. She is not currently on statin therapy given reported h/o statin intolerance. She did not tolerate Lipitor, Crestor nor Pravastatin and not interested in statin retrial. Plan is to refer to Lipid Clinic or Dr. Rennis GoldenHilty for further management and consideration of PCSK9i therapy. Pt will be discharged home on DAPT w/ ASA and Brilitna, to be continued for a minimum of 1 year. She was also discharged home on a  blocker and ARB + SL NTG for PRN use.   Pt was last seen and examined by Dr. Rennis GoldenHilty on 03/03/19 and felt stable for discharge home. She will f/u with Dr. Bing MatterKrasowski in 1-2 weeks.   Consultants: none   Discharge Vitals Blood pressure (!) 158/65, pulse 75, temperature 98.3 F (36.8 C), temperature  source Oral, resp. rate 18, height 5\' 1"  (1.549 m), weight 80.2 kg, SpO2 98 %.  Filed Weights   03/01/19 1942 03/02/19 0555 03/03/19 0508  Weight: 79.7 kg 79.7 kg 80.2 kg    Labs & Radiologic Studies    CBC Recent Labs    03/01/19 1341 03/03/19 0448  WBC 8.0 19.8*  NEUTROABS 5.2  --   HGB 14.0 13.6  HCT 43.5 42.4  MCV 91.2 92.8  PLT 276 319   Basic Metabolic Panel Recent Labs    96/11/5405/08/20 0253 03/03/19 0448  NA 136 136  K 4.1 4.4  CL 104 104  CO2 22 22  GLUCOSE 166* 110*  BUN 7* 13  CREATININE 0.87 0.97  CALCIUM 9.4 8.9   Liver Function Tests Recent Labs    03/02/19 0253  AST 20  ALT 19  ALKPHOS 134*  BILITOT 0.7  PROT 7.9  ALBUMIN 3.9   No results for input(s): LIPASE, AMYLASE in the last 72 hours. Cardiac Enzymes No results for input(s): CKTOTAL, CKMB, CKMBINDEX, TROPONINI in the last 72 hours. BNP Invalid input(s): POCBNP D-Dimer No results for input(s): DDIMER in the last 72 hours.  Hemoglobin A1C Recent Labs    03/01/19 2014  HGBA1C 5.4   Fasting Lipid Panel Recent Labs    03/02/19 0253  CHOL 252*  HDL 56  LDLCALC 186*  TRIG 48  CHOLHDL 4.5   Thyroid Function Tests Recent Labs    03/01/19 2014  TSH 0.439   _____________  Dg Chest Port 1 View  Result Date: 03/01/2019 CLINICAL DATA:  Pt c/o chest pain, pressure and left arm pain since this am, hx MI in 2013, HTN, cardiac cath. 3 stents EXAM: PORTABLE CHEST 1 VIEW COMPARISON:  03/05/2017 FINDINGS: Cardiac silhouette is normal in size and configuration. No mediastinal or hilar masses. No evidence of adenopathy. Streaky opacities noted in the medial lung bases consistent with chronic bronchitic change, stable. Lungs are otherwise clear. No evidence of a pleural effusion. No pneumothorax. Skeletal structures are grossly intact. IMPRESSION: No active disease. Electronically Signed   By: Amie Portland M.D.   On: 03/01/2019 13:49   Disposition   Pt is being discharged home today in good  condition.  Follow-up Plans & Appointments    Follow-up Information    Georgeanna Lea, MD Follow up.   Specialty: Cardiology Why: our office will call you with a hospital follow visit in 1-2 weeks  Contact information: [address] [place] [address] [phone]          Discharge Instructions    Amb Referral to Cardiac Rehabilitation   Complete by: As directed    Will send Cardiac Phase 2 referral to [facility]   Diagnosis: Coronary Stents   After initial evaluation and assessments completed: Virtual Based Care may be provided alone or in conjunction with Phase 2 Cardiac Rehab based on patient barriers.: No      Discharge Medications   Allergies as of 03/03/2019      Reactions   Bee Venom Anaphylaxis   Penciclovir Hives   Shellfish-derived Products Other (See Comments)   Breathing difficulty   Sulfa Antibiotics Hives   Contrast Media [iodinated Diagnostic Agents]    Breathing difficulty   Penicillins Hives, Rash   Did it involve swelling of the face/tongue/throat, SOB, or low BP? No Did it involve sudden or severe rash/hives, skin peeling, or any reaction on the inside of your mouth or nose? Yes Did you need to seek medical attention at a hospital or doctor's office? No When did it last happen?Pt was a child  If all above answers are NO, may proceed with cephalosporin use.   Ramipril Nausea And Vomiting   pancreatis      Medication List    TAKE these medications   ALPRAZolam 1 MG tablet Commonly known as: XANAX Take 1 mg 2 (two) times daily as needed by mouth for anxiety.   aspirin 81 MG EC tablet Take 1 tablet (81 mg total) by mouth daily. Start taking on: March 04, 2019 What changed: how much to take   cetirizine 10 MG tablet Commonly known as: ZYRTEC Take 10 mg daily by mouth.   EPINEPHrine 0.3 mg/0.3 mL Soaj injection Commonly known as: EPI-PEN Inject 0.3 mg once into the muscle.   famotidine 20 MG tablet Commonly known as:  PEPCID Take 20 mg as needed by mouth for heartburn or indigestion.   levothyroxine 100 MCG tablet Commonly known as: SYNTHROID Take 1 tablet daily by mouth.   metoprolol tartrate 50 MG tablet Commonly known as: LOPRESSOR Take 1 tablet (50 mg total) by mouth 2 (two) times daily.   nitroGLYCERIN 0.4  MG SL tablet Commonly known as: NITROSTAT Place 1 tablet (0.4 mg total) under the tongue every 5 (five) minutes as needed for chest pain.   ticagrelor 90 MG Tabs tablet Commonly known as: BRILINTA Take 1 tablet (90 mg total) by mouth 2 (two) times daily.   ticagrelor 90 MG Tabs tablet Commonly known as: BRILINTA Take 1 tablet (90 mg total) by mouth 2 (two) times daily.   valsartan 160 MG tablet Commonly known as: DIOVAN Take 1 tablet (160 mg total) by mouth 2 (two) times daily.   VITAMIN B-12 IJ Inject 1 Dose as directed every 30 (thirty) days.   Vitamin D (Ergocalciferol) 1.25 MG (50000 UT) Caps capsule Commonly known as: DRISDOL Take 50,000 Units by mouth every 7 (seven) days.        Acute coronary syndrome (MI, NSTEMI, STEMI, etc) this admission?: No.    Outstanding Labs/Studies     Duration of Discharge Encounter   Greater than 30 minutes including physician time.  Signed, Lyda Jester, PA-C 03/03/2019, 10:49 AM

## 2019-03-03 NOTE — Discharge Instructions (Signed)
Angina ° °Angina is very bad discomfort or pain in the chest, neck, arm, jaw, or back. The discomfort is caused by a lack of blood in the middle layer of the heart wall (myocardium). °What are the causes? °This condition is caused by a buildup of fat and cholesterol (plaque) in your arteries (atherosclerosis). This buildup narrows the arteries and makes it hard for blood to flow. °What increases the risk? °You are more likely to develop this condition if: °· You have high levels of cholesterol in your blood. °· You have high blood pressure (hypertension). °· You have diabetes. °· You have a family history of heart disease. °· You are not active, or you do not exercise enough. °· You feel sad (depressed). °· You have been treated with high energy rays (radiation) on the left side of your chest. °Other risk factors are: °· Using tobacco. °· Being very overweight (obese). °· Eating a diet high in unhealthy fats (saturated fats). °· Having stress, or being exposed to things that cause stress. °· Using drugs, such as cocaine. °Women have a greater risk for angina if: °· They are older than 55. °· They have stopped having their period (are in postmenopause). °What are the signs or symptoms? °Common symptoms of this condition in both men and women may include: °· Chest pain, which may: °? Feel like a crushing or squeezing in the chest. °? Feel like a tightness, pressure, fullness, or heaviness in the chest. °? Last for more than a few minutes at a time. °? Stop and come back (recur) after a few minutes. °· Pain in the neck, arm, jaw, or back. °· Heartburn or upset stomach (indigestion) for no reason. °· Being short of breath. °· Feeling sick to your stomach (nauseous). °· Sudden cold sweats. °Women and people with diabetes may have other symptoms that are not usual, such as feeling: °· Tired (fatigue). °· Worried or nervous (anxious) for no reason. °· Weak for no reason. °· Dizzy or passing out (fainting). °How is this  treated? °This condition may be treated with: °· Medicines. These are given to: °? Prevent blood clots. °? Prevent heart attack. °? Relax blood vessels and improve blood flow to the heart (nitrates). °? Reduce blood pressure. °? Improve the pumping action of the heart. °? Reduce fat and cholesterol in the blood. °· A procedure to widen a narrowed or blocked artery in the heart (angioplasty). °· Surgery to allow blood to go around a blocked artery (coronary artery bypass surgery). °Follow these instructions at home: °Medicines °· Take over-the-counter and prescription medicines only as told by your doctor. °· Do not take these medicines unless your doctor says that you can: °? NSAIDs. These include: °§ Ibuprofen. °§ Naproxen. °? Vitamin supplements that have vitamin A, vitamin E, or both. °? Hormone therapy that contains estrogen with or without progestin. °Eating and drinking ° °· Eat a heart-healthy diet that includes: °? Lots of fresh fruits and vegetables. °? Whole grains. °? Low-fat (lean) protein. °? Low-fat dairy products. °· Follow instructions from your doctor about what you cannot eat or drink. °Activity °· Follow an exercise program that your doctor tells you. °· Talk with your doctor about joining a program to help improve the health of your heart (cardiac rehab). °· When you feel tired, take a break. Plan breaks if you know you are going to feel tired. °Lifestyle ° °· Do not use any products that contain nicotine or tobacco. This includes cigarettes, e-cigarettes, and   chewing tobacco. If you need help quitting, ask your doctor. °· If your doctor says you can drink alcohol: °? Limit how much you use to: °§ 0-1 drink a day for women who are not pregnant. °§ 0-2 drinks a day for men. °? Be aware of how much alcohol is in your drink. In the [place] one drink equals: °§ One 12 oz bottle of beer (355 mL). °§ One 5 oz glass of wine (148 mL). °§ One 1½ oz glass of hard liquor (44 mL). °General instructions °· Stay  at a healthy weight. If your doctor tells you to do so, work with him or her to lose weight. °· Learn to deal with stress. If you need help, ask your doctor. °· Keep your vaccines up to date. Get a flu shot every year. °· Talk with your doctor if you feel sad. Take a screening test to see if you are at risk for depression. °· Work with your doctor to manage any other health problems that you have. These may include diabetes or high blood pressure. °· Keep all follow-up visits as told by your doctor. This is important. °Get help right away if: °· You have pain in your chest, neck, arm, jaw, or back, and the pain: °? Lasts more than a few minutes. °? Comes back. °? Does not get better after you take medicine under your tongue (sublingual nitroglycerin). °? Keeps getting worse. °? Comes more often. °· You have any of these problems for no reason: °? Sweating a lot. °? Heartburn or upset stomach. °? Shortness of breath. °? Trouble breathing. °? Feeling sick to your stomach. °? Throwing up (vomiting). °? Feeling more tired than normal. °? Feeling nervous or worrying more than normal. °? Weakness. °· You are suddenly dizzy or light-headed. °· You pass out. °These symptoms may be an emergency. Do not wait to see if the symptoms will go away. Get medical help right away. Call your local emergency services (911 in the [place] Do not drive yourself to the hospital. °Summary °· Angina is very bad discomfort or pain in the chest, neck, arm, neck, or back. °· Symptoms include chest pain, heartburn or upset stomach for no reason, and shortness of breath. °· Women or people with diabetes may have symptoms that are not usual, such as feeling nervous or worried for no reason, weak for no reason, or tired. °· Take all medicines only as told by your doctor. °· You should eat a heart-healthy diet and follow an exercise program. °This information is not intended to replace advice given to you by your health care provider. Make sure you  discuss any questions you have with your health care provider. °Document Released: 01/28/2008 Document Revised: 03/29/2018 Document Reviewed: 03/29/2018 °Elsevier Patient Education © 2020 [employer] ° °

## 2019-03-03 NOTE — Progress Notes (Signed)
Progress Note  Patient Name: Kaitlyn Jimenez Date of Encounter: 03/03/2019  Primary Cardiologist: Jenne Campus, MD   Subjective   Feeling ok this morning and feels ready to go home. She reports that she had an episode of chest pain shortly post cath yesterday, in the setting of elevated SBP in the 200s. Her BP improved and CP resolved after IV meds were given. She has had no further CP. No dyspnea. Ambulating w/o difficulty. Did ok w/ contrast. Radical cath site is stable.   Inpatient Medications    Scheduled Meds: . aspirin EC  81 mg Oral Daily  . irbesartan  150 mg Oral Daily  . levothyroxine  100 mcg Oral Daily  . loratadine  10 mg Oral Daily  . metoprolol tartrate  50 mg Oral BID  . sodium chloride flush  3 mL Intravenous Q12H  . sodium chloride flush  3 mL Intravenous Q12H  . ticagrelor  90 mg Oral BID   Continuous Infusions: . sodium chloride 80 mL/hr at 03/02/19 0600  . sodium chloride     PRN Meds: sodium chloride, sodium chloride, acetaminophen, ALPRAZolam, famotidine, morphine injection, nitroGLYCERIN, ondansetron (ZOFRAN) IV, sodium chloride flush, sodium chloride flush, zolpidem   Vital Signs    Vitals:   03/02/19 1500 03/02/19 1512 03/02/19 1935 03/03/19 0508  BP: (!) 161/62 (!) 164/77 (!) 141/67 (!) 158/65  Pulse: 84 86 89 75  Resp: 19  20 18   Temp:   97.6 F (36.4 C) 98.3 F (36.8 C)  TempSrc:   Oral Oral  SpO2: 97%  100% 98%  Weight:    80.2 kg  Height:        Intake/Output Summary (Last 24 hours) at 03/03/2019 0741 Last data filed at 03/03/2019 0500 Gross per 24 hour  Intake 2608.51 ml  Output 300 ml  Net 2308.51 ml   Last 3 Weights 03/03/2019 03/02/2019 03/01/2019  Weight (lbs) 176 lb 12.8 oz 175 lb 11.3 oz 175 lb 11.3 oz  Weight (kg) 80.196 kg 79.7 kg 79.7 kg      Telemetry    NSR, HR in the low 80s - Personally Reviewed  ECG    NSR, chronic LBBB 82 bpm - Personally Reviewed  Physical Exam   GEN: No acute distress.   Neck: No JVD  Cardiac: RRR, no murmurs, rubs, or gallops.  Respiratory: Clear to auscultation bilaterally. GI: Soft, nontender, non-distended  MS: No edema; No deformity. Neuro:  Nonfocal  Psych: Normal affect   Labs    High Sensitivity Troponin:   Recent Labs  Lab 03/01/19 1341 03/01/19 2014 03/01/19 2249  TROPONINIHS 12  6 13 16       Cardiac EnzymesNo results for input(s): TROPONINI in the last 168 hours. No results for input(s): TROPIPOC in the last 168 hours.   Chemistry Recent Labs  Lab 03/01/19 1341 03/02/19 0253 03/03/19 0448  NA 135 136 136  K 4.2 4.1 4.4  CL 102 104 104  CO2 24 22 22   GLUCOSE 108* 166* 110*  BUN 12 7* 13  CREATININE 0.77 0.87 0.97  CALCIUM 9.4 9.4 8.9  PROT  --  7.9  --   ALBUMIN  --  3.9  --   AST  --  20  --   ALT  --  19  --   ALKPHOS  --  134*  --   BILITOT  --  0.7  --   GFRNONAA >60 >60 >60  GFRAA >60 >60 >60  ANIONGAP 9  10 10     Hematology Recent Labs  Lab 03/01/19 1341 03/03/19 0448  WBC 8.0 19.8*  RBC 4.77 4.57  HGB 14.0 13.6  HCT 43.5 42.4  MCV 91.2 92.8  MCH 29.4 29.8  MCHC 32.2 32.1  RDW 12.7 13.1  PLT 276 319    BNPNo results for input(s): BNP, PROBNP in the last 168 hours.   DDimer No results for input(s): DDIMER in the last 168 hours.   Radiology    Dg Chest Port 1 View  Result Date: 03/01/2019 CLINICAL DATA:  Pt c/o chest pain, pressure and left arm pain since this am, hx MI in 2013, HTN, cardiac cath. 3 stents EXAM: PORTABLE CHEST 1 VIEW COMPARISON:  03/05/2017 FINDINGS: Cardiac silhouette is normal in size and configuration. No mediastinal or hilar masses. No evidence of adenopathy. Streaky opacities noted in the medial lung bases consistent with chronic bronchitic change, stable. Lungs are otherwise clear. No evidence of a pleural effusion. No pneumothorax. Skeletal structures are grossly intact. IMPRESSION: No active disease. Electronically Signed   By: Amie Portlandavid  Ormond M.D.   On: 03/01/2019 13:49    Cardiac  Studies   LHC 03/02/19 LEFT HEART CATH AND CORONARY ANGIOGRAPHY  Conclusion  1. Multivessel CAD with moderate 50% diffuse LAD in-stent restenosis, patency of the left circumflex, and severe stenosis of the proximal RCA 2. 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 Successful PCI of the RCA with a 3.0x32 mm Synergy DES  Recommend: ASA/ticagrelor at least 12 months, aggressive medical therapy   Coronary Diagrams  Diagnostic Dominance: Right  Intervention     Wall Motion  Resting       The mid anterior, mid inferior, basilar anterior, basilar inferior and apical anterior segments are normal. The apical inferior segment is dyskinetic.           Left Heart  Left Ventricle The left ventricular size is normal. The left ventricular systolic function is normal. LV end diastolic pressure is normal. The left ventricular ejection fraction is 55-65% by visual estimate. There are LV function abnormalities due to segmental dysfunction. 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 The left ventricle has normal systolic function with an ejection fraction of 60-65%. The cavity size was normal. Left ventricular diastolic function could not be evaluated due to nondiagnostic images. No evidence of left ventricular regional wall  motion abnormalities.  2. The right ventricle has normal systolic function. The cavity was normal. There is no increase in right ventricular wall thickness.  3. The aortic valve is tricuspid. Mild sclerosis of the aortic valve. Aortic valve regurgitation was not assessed by color flow Doppler.   Patient Profile     [name] yo female history of CAD, PSVT, HTN, HL, chronic LBBB admitted w/ unstable angina.   Assessment & Plan    1. CAD w/ Unstable Angina: we do not have access to prior caths in our system nor descriptions of anatomy in prior clinic notes. From  clinic notes reported, she does have history of PCI in 2007 and 2013. Last cath in 2015 with mild nonobstructive disease. She presented 03/01/19 with symptoms similar to previous angina associated w/ her MI in 2013, including exertional chest tightness and left arm pain, relieved with rest.              - She has ruled out for MI. 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   - She underwent successful PCI of the RCA with a 3.0x32 mm Synergy DES.  - Post cath: doing well. No CP this morning. Denies dyspnea. Ambulating w/o difficulty. Tolerated contrast ok. Radial cath site is stable w/ 2+ radial pulse. SCr and H/H stable.   - Plan to treat w/ DAPT w/ ASA + Brilinta for a minimum of 1 year.              - Continue ASA, ? blocker and ARB. She is not on statin therapy due to h/o intolerance but will need to consider other lipid lowering therapies (referal to [facility]).   2. HLD: Lipid panel this admit shows severely elevated LDL at 186 mg/dL. Her LDL goal given established h/o CAD is < 70 mg/dL. She is not currently on statin therapy given reported h/o statin intolerance. She did not tolerate Lipitor, Crestor nor Pravastatin. I do not think that addition of Zetia alone will get her to recommended goal. We discussed statin retrial but pt not interested. Would only consider very low dose Pravastatin. No Lipitor or Crestor. We will refer to Dr. [facility] for further management. Consider [id number] therapy.   3. HTN: BP goal <130/80. She had elevated BP shortly after cath w/ SBP in the 200s that improved after IV labetalol.  Improved today but still moderately elevated in the 150s systolic. Continue home ARB and ? blocker. With resting HRs in the  90s, could further titrate metoprolol to 75-100 mg BID.   4. Contrast Dye Allergy: tolerated contrast for cath ok. She received prednisone, diphenhydramine and famotidine.   5. Leukocytosis: WBC 19.8. Normal on admit on 7/7 at 8.0. Afebrile. Denies SOB, cough, fever, chills and dysuria. ? If 2/2 to prednisone used for contrast dye allergy. Can get f/u CBC at time of clinic f/u.   Dispo: plan to d/c home today after MD clears. F/u with Dr. Bing Matter in 7-10 days  For questions or updates, please contact [facility] Please consult [email or web address] for contact info under        Signed, Robbie Lis, PA-C  03/03/2019, 7:41 AM

## 2019-03-03 NOTE — Care Management (Signed)
1059 03-03-19 CM did speak with patient regarding Brilinta and co pay cost. Patient states that the co pay is affordable. Rx. Sent to [facility] in [place]. 30 day free card provided to patient. No further needs from CM at this time. Bethena Roys, RN,BSN Case Manager [phone]

## 2019-03-03 NOTE — Telephone Encounter (Signed)
Was in hospital and had stent put in and put on Brilinta. Pharmacy did not have any so gave pt 2 bottles of spls w/8 pills each.

## 2019-03-04 ENCOUNTER — Telehealth: Payer: Self-pay

## 2019-03-04 NOTE — Telephone Encounter (Signed)
Spoke with patient, she had recently scratched a bump on her leg which started bleeding. She stated that she had a problem with getting it to stop. She is on both [place] and Asprin I discussed this with Laurann Montana, NP. She had me advise to continue both medications, but if brusing starts to become bad then she will need to come in for a CBC. I have advised that she call back and let me know if she needs anything.

## 2019-03-07 NOTE — Telephone Encounter (Signed)
Pt states the rash was better this morning after not taking the Brillinta last night. Could her rash be as result of the Brilinta? Please advise.

## 2019-03-08 NOTE — Telephone Encounter (Signed)
Telephone call to patient. States had a cardiac cath and is allergic to iodine dye. Rash started in hospital and she has not stopped the [place]. Rash is better with benadryl. Today she says it is much better. Spoke with Laurann Montana NP-C who says its probably the dye and to continue the [place] for now. If rash worsens or does not get better to call back. Patient has a follow up appointment 03/14/2019 at 1340.

## 2019-03-14 ENCOUNTER — Ambulatory Visit (INDEPENDENT_AMBULATORY_CARE_PROVIDER_SITE_OTHER): Payer: Medicare HMO | Admitting: Cardiology

## 2019-03-14 ENCOUNTER — Other Ambulatory Visit: Payer: Self-pay

## 2019-03-14 ENCOUNTER — Encounter: Payer: Self-pay | Admitting: Cardiology

## 2019-03-14 VITALS — BP 150/68 | HR 74 | Ht 61.0 in | Wt 176.8 lb

## 2019-03-14 DIAGNOSIS — I25110 Atherosclerotic heart disease of native coronary artery with unstable angina pectoris: Secondary | ICD-10-CM

## 2019-03-14 DIAGNOSIS — I10 Essential (primary) hypertension: Secondary | ICD-10-CM

## 2019-03-14 DIAGNOSIS — E785 Hyperlipidemia, unspecified: Secondary | ICD-10-CM

## 2019-03-14 MED ORDER — AMLODIPINE BESYLATE 5 MG PO TABS: 5.0000 mg | ORAL_TABLET | Freq: Every day | ORAL | 1 refills | Status: DC

## 2019-03-14 NOTE — Progress Notes (Signed)
Cardiology Office Note:    Date:  03/14/2019   ID:  MAKYRA LEIDING, DOB 1950/09/27, MRN [id number]  PCP:  Guadalupe Maple., MD  Cardiologist:  Gypsy Balsam, MD    Referring MD: Guadalupe Maple., MD   Chief Complaint  Patient presents with  . Follow up on Stent  Doing much better  History of Present Illness:    Kaitlyn Jimenez is a 68 y.o. female with history of PTCA and stenting in face of acute myocardial infarction 2 years ago to LAD.  2 weeks ago she showed up in my office complaining of having chest pain I sent her to the emergency room she was eventually transferred to [facility] cardiac catheterization was done she was found to have critical lesion of the proximal right coronary artery that was addressed with stenting.  Since that time she is doing well.  She does have difficulty with dye allergy and actually after she came home she started having itching and some skin redness but overall recovering from it.  Also complained of having some difficulty with Brilinta initially because of shortness of breath and now she complaining of having paying a lot for this medication for the dose on top of her insurance.  Cardiac wise she is doing well.  Past Medical History:  Diagnosis Date  . Hypertension   . MI (myocardial infarction) (HCC)   . MI (myocardial infarction) (HCC)   . Thyroid disease     Past Surgical History:  Procedure Laterality Date  . ABDOMINAL HYSTERECTOMY    . CARDIAC CATHETERIZATION    . CHOLECYSTECTOMY    . CORONARY ANGIOPLASTY WITH STENT PLACEMENT    . LEFT HEART CATH AND CORONARY ANGIOGRAPHY N/A 03/02/2019   Procedure: LEFT HEART CATH AND CORONARY ANGIOGRAPHY;  Surgeon: Tonny Bollman, MD;  Location: [facility] INVASIVE CV LAB;  Service: Cardiovascular;  Laterality: N/A;    Current Medications: Current Meds  Medication Sig  . ALPRAZolam (XANAX) 1 MG tablet Take 1 mg 2 (two) times daily as needed by mouth for anxiety.  Marland Kitchen aspirin EC 81 MG EC tablet Take 1 tablet (81  mg total) by mouth daily.  . cetirizine (ZYRTEC) 10 MG tablet Take 10 mg daily by mouth.  . Cyanocobalamin (VITAMIN B-12 IJ) Inject 1 Dose as directed every 30 (thirty) days.  Marland Kitchen EPINEPHrine 0.3 mg/0.3 mL IJ SOAJ injection Inject 0.3 mg once into the muscle.  . famotidine (PEPCID) 20 MG tablet Take 20 mg as needed by mouth for heartburn or indigestion.  Marland Kitchen levothyroxine (SYNTHROID, LEVOTHROID) 100 MCG tablet Take 1 tablet daily by mouth.  . metoprolol tartrate (LOPRESSOR) 50 MG tablet Take 1 tablet (50 mg total) by mouth 2 (two) times daily.  . nitroGLYCERIN (NITROSTAT) 0.4 MG SL tablet Place 1 tablet (0.4 mg total) under the tongue every 5 (five) minutes as needed for chest pain.  . ticagrelor (BRILINTA) 90 MG TABS tablet Take 1 tablet (90 mg total) by mouth 2 (two) times daily.  . ticagrelor (BRILINTA) 90 MG TABS tablet Take 1 tablet (90 mg total) by mouth 2 (two) times daily.  . valsartan (DIOVAN) 160 MG tablet Take 1 tablet (160 mg total) by mouth 2 (two) times daily.  . Vitamin D, Ergocalciferol, (DRISDOL) 1.25 MG (50000 UT) CAPS capsule Take 50,000 Units by mouth every 7 (seven) days.     Allergies:   Bee venom, Penciclovir, Shellfish-derived products, Sulfa antibiotics, Contrast media [iodinated diagnostic agents], Penicillins, and Ramipril   Social History  Socioeconomic History  . Marital status: Married    Spouse name: Not on file  . Number of children: Not on file  . Years of education: Not on file  . Highest education level: Not on file  Occupational History  . Not on file  Social Needs  . Financial resource strain: Not on file  . Food insecurity    Worry: Not on file    Inability: Not on file  . Transportation needs    Medical: Not on file    Non-medical: Not on file  Tobacco Use  . Smoking status: Former Games developermoker  . Smokeless tobacco: Never Used  Substance and Sexual Activity  . Alcohol use: No    Frequency: Never  . Drug use: No  . Sexual activity: Not on file   Lifestyle  . Physical activity    Days per week: Not on file    Minutes per session: Not on file  . Stress: Not on file  Relationships  . Social Musicianconnections    Talks on phone: Not on file    Gets together: Not on file    Attends religious service: Not on file    Active member of club or organization: Not on file    Attends meetings of clubs or organizations: Not on file    Relationship status: Not on file  Other Topics Concern  . Not on file  Social History Narrative  . Not on file     Family History: The patient's family history includes Arrhythmia in her sister; Congestive Heart Failure in her brother; Heart attack in her brother and father; Heart disease in her maternal uncle, mother, and paternal uncle; Rheum arthritis in her brother. ROS:   Please see the history of present illness.    All 14 point review of systems negative except as described per history of present illness  EKGs/Labs/Other Studies Reviewed:     [facility] 03/02/19 LEFT HEART CATH AND CORONARY ANGIOGRAPHY  Conclusion  1. Multivessel CAD with moderate 50% diffuse LAD in-stent restenosis, patency of the left circumflex, and severe stenosis of the proximal RCA 2. 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 Successful PCI of the RCA with a 3.0x32 mm Synergy DES  Recommend: ASA/ticagrelor at least 12 months, aggressive medical therapy   Coronary Diagrams  Diagnostic Dominance: Right  Intervention     Wall Motion       Resting       The mid anterior, mid inferior, basilar anterior, basilar inferior and apical anterior segments are normal. The apical inferior segment is dyskinetic.           Left Heart  Left Ventricle The left ventricular size is normal. The left ventricular systolic function is normal. LV end diastolic pressure is normal. The left ventricular ejection fraction is 55-65% by visual estimate. There are LV function abnormalities due to segmental dysfunction.  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 The left ventricle has normal systolic function with an ejection fraction of 60-65%. The cavity size was normal. Left ventricular diastolic function could not be evaluated due to nondiagnostic images. No evidence of left ventricular regional wall  motion abnormalities. 2. The right ventricle has normal systolic function. The cavity was normal. There is no increase in right ventricular wall thickness. 3. The aortic valve is tricuspid. Mild sclerosis of the aortic valve. Aortic valve regurgitation was not assessed by color flow  Doppler   Recent Labs: 03/01/2019: TSH 0.439 03/02/2019: ALT 19 03/03/2019: BUN 13; Creatinine, Ser 0.97; Hemoglobin 13.6; Platelets 319; Potassium 4.4; Sodium 136  Recent Lipid Panel    Component Value Date/Time   CHOL 252 (H) 03/02/2019 0253   TRIG 48 03/02/2019 0253   HDL 56 03/02/2019 0253   CHOLHDL 4.5 03/02/2019 0253   VLDL 10 03/02/2019 0253   LDLCALC 186 (H) 03/02/2019 0253    Physical Exam:    VS:  BP (!) 150/68   Pulse 74   Ht 5\' 1"  (1.549 m)   Wt 176 lb 12.8 oz (80.2 kg)   SpO2 98%   BMI 33.41 kg/m     Wt Readings from Last 3 Encounters:  03/14/19 176 lb 12.8 oz (80.2 kg)  03/03/19 176 lb 12.8 oz (80.2 kg)  03/01/19 178 lb 6.4 oz (80.9 kg)     GEN:  Well nourished, well developed in no acute distress HEENT: Normal NECK: No JVD; No carotid bruits LYMPHATICS: No lymphadenopathy CARDIAC: RRR, no murmurs, no rubs, no gallops RESPIRATORY:  Clear to auscultation without rales, wheezing or rhonchi  ABDOMEN: Soft, non-tender, non-distended MUSCULOSKELETAL:  No edema; No deformity  SKIN: Warm and dry LOWER EXTREMITIES: no swelling NEUROLOGIC:  Alert and oriented x 3 PSYCHIATRIC:  Normal affect   ASSESSMENT:    1. Coronary artery disease involving native coronary artery of native heart with unstable angina pectoris  ([place])   2. Dyslipidemia   3. Essential hypertension    PLAN:    In order of problems listed above:  1. Coronary artery disease status post PTCA and stenting of drug-eluting stent to proximal RCA, on dual antiplatelets therapy which I will continue.  We talk again about risk factors and modification of this risk factor is good diet and exercises on a regular basis. 2. Dyslipidemia with intolerance to statin she is already scheduled to see our lipid clinic.  She will require PCSK9 agent. 3. Essential hypertension blood pressure elevated today I will start her on 5 mg Norvasc on top of all medication she takes.  See her back in my office in 2 months sooner if she got a problem   Medication Adjustments/Labs and Tests Ordered: Current medicines are reviewed at length with the patient today.  Concerns regarding medicines are outlined above.  No orders of the defined types were placed in this encounter.  Medication changes: No orders of the defined types were placed in this encounter.   Signed, Park Liter, MD, [facility] 03/14/2019 1:58 PM    [place]

## 2019-03-14 NOTE — Addendum Note (Signed)
Addended by: Ashok Norris on: 03/14/2019 02:07 PM   Modules accepted: Orders

## 2019-03-14 NOTE — Patient Instructions (Addendum)
Medication Instructions:  Your physician has recommended you make the following change in your medication:  Start: Amlodipine 5 mg daily   If you need a refill on your cardiac medications before your next appointment, please call your pharmacy.   Lab work: None.  If you have labs (blood work) drawn today and your tests are completely normal, you will receive your results only by: Marland Kitchen MyChart Message (if you have MyChart) OR . A paper copy in the mail If you have any lab test that is abnormal or we need to change your treatment, we will call you to review the results.  Testing/Procedures: None   Follow-Up: At [facility], you and your health needs are our priority.  As part of our continuing mission to provide you with exceptional heart care, we have created designated Provider Care Teams.  These Care Teams include your primary Cardiologist (physician) and Advanced Practice Providers (APPs -  Physician Assistants and Nurse Practitioners) who all work together to provide you with the care you need, when you need it. You will need a follow up appointment in 2 months.  Please call our office 2 months in advance to schedule this appointment.  You may see Gypsy Balsam, MD or another member of our [facility] Provider Team in [place]: Norman Herrlich, MD . Belva Crome, MD  Any Other Special Instructions Will Be Listed Below (If Applicable).  Amlodipine tablets What is this medicine? AMLODIPINE (am LOE di peen) is a calcium-channel blocker. It affects the amount of calcium found in your heart and muscle cells. This relaxes your blood vessels, which can reduce the amount of work the heart has to do. This medicine is used to lower high blood pressure. It is also used to prevent chest pain. This medicine may be used for other purposes; ask your health care provider or pharmacist if you have questions. COMMON BRAND NAME(S): Norvasc What should I tell my health care provider before I take  this medicine? They need to know if you have any of these conditions:  heart disease  liver disease  an unusual or allergic reaction to amlodipine, other medicines, foods, dyes, or preservatives  pregnant or trying to get pregnant  breast-feeding How should I use this medicine? Take this medicine by mouth with a glass of water. Follow the directions on the prescription label. You can take it with or without food. If it upsets your stomach, take it with food. Take your medicine at regular intervals. Do not take it more often than directed. Do not stop taking except on your doctor's advice. Talk to your pediatrician regarding the use of this medicine in children. While this drug may be prescribed for children as young as 6 years for selected conditions, precautions do apply. Patients over 19 years of age may have a stronger reaction and need a smaller dose. Overdosage: If you think you have taken too much of this medicine contact a poison control center or emergency room at once. NOTE: This medicine is only for you. Do not share this medicine with others. What if I miss a dose? If you miss a dose, take it as soon as you can. If it is almost time for your next dose, take only that dose. Do not take double or extra doses. What may interact with this medicine? Do not take this medicine with any of the following medications:  tranylcypromine This medicine may also interact with the following medications:  clarithromycin  cyclosporine  diltiazem  itraconazole  simvastatin  tacrolimus This list may not describe all possible interactions. Give your health care provider a list of all the medicines, herbs, non-prescription drugs, or dietary supplements you use. Also tell them if you smoke, drink alcohol, or use illegal drugs. Some items may interact with your medicine. What should I watch for while using this medicine? Visit your healthcare professional for regular checks on your  progress. Check your blood pressure as directed. Ask your healthcare professional what your blood pressure should be and when you should contact him or her. Do not treat yourself for coughs, colds, or pain while you are using this medicine without asking your healthcare professional for advice. Some medicines may increase your blood pressure. You may get dizzy. Do not drive, use machinery, or do anything that needs mental alertness until you know how this medicine affects you. Do not stand or sit up quickly, especially if you are an older patient. This reduces the risk of dizzy or fainting spells. Avoid alcoholic drinks; they can make you dizzier. What side effects may I notice from receiving this medicine? Side effects that you should report to your doctor or health care professional as soon as possible:  allergic reactions like skin rash, itching or hives; swelling of the face, lips, or tongue  fast, irregular heartbeat  signs and symptoms of low blood pressure like dizziness; feeling faint or lightheaded, falls; unusually weak or tired  swelling of ankles, feet, hands Side effects that usually do not require medical attention (report these to your doctor or health care professional if they continue or are bothersome):  dry mouth  facial flushing  headache  stomach pain  tiredness This list may not describe all possible side effects. Call your doctor for medical advice about side effects. You may report side effects to FDA at 1-800-FDA-1088. Where should I keep my medicine? Keep out of the reach of children. Store at room temperature between 59 and 86 degrees F (15 and 30 degrees C). Throw away any unused medicine after the expiration date. NOTE: This sheet is a summary. It may not cover all possible information. If you have questions about this medicine, talk to your doctor, pharmacist, or health care provider.  2020 Elsevier/Gold Standard (2018-03-05 15:07:10)

## 2019-03-16 ENCOUNTER — Telehealth: Payer: Self-pay | Admitting: Cardiology

## 2019-03-16 NOTE — Telephone Encounter (Signed)
Wants to go back on Plavix, Brilenta making her SOB

## 2019-03-18 MED ORDER — CLOPIDOGREL BISULFATE 75 MG PO TABS: 75.0000 mg | ORAL_TABLET | Freq: Every day | ORAL | 1 refills | Status: DC

## 2019-03-18 NOTE — Telephone Encounter (Signed)
Ok, I talk to her about it, so ok, stop Brilinta, start Plavix, start with 300 mg first day and 75 mg daily after that

## 2019-03-18 NOTE — Addendum Note (Signed)
Addended by: Ashok Norris on: 03/18/2019 12:25 PM   Modules accepted: Orders

## 2019-03-18 NOTE — Telephone Encounter (Signed)
Medications updated

## 2019-04-15 ENCOUNTER — Telehealth: Payer: Self-pay | Admitting: Internal Medicine

## 2019-04-15 NOTE — Telephone Encounter (Signed)
LM for patient to inform her that her 04/28/2019 lipid clinic consult is on a [place] day.   Asked that she call back to confirm if a) virtual is OK or b) reschedule for in-office lipid clinic consult (30 mins)

## 2019-04-28 ENCOUNTER — Encounter: Payer: Self-pay | Admitting: Internal Medicine

## 2019-04-28 ENCOUNTER — Telehealth: Payer: Self-pay | Admitting: Internal Medicine

## 2019-04-28 ENCOUNTER — Telehealth (INDEPENDENT_AMBULATORY_CARE_PROVIDER_SITE_OTHER): Payer: Medicare HMO | Admitting: Internal Medicine

## 2019-04-28 VITALS — BP 124/61 | HR 70 | Wt 170.0 lb

## 2019-04-28 DIAGNOSIS — E7849 Other hyperlipidemia: Secondary | ICD-10-CM

## 2019-04-28 DIAGNOSIS — Z789 Other specified health status: Secondary | ICD-10-CM

## 2019-04-28 DIAGNOSIS — E785 Hyperlipidemia, unspecified: Secondary | ICD-10-CM

## 2019-04-28 DIAGNOSIS — I2510 Atherosclerotic heart disease of native coronary artery without angina pectoris: Secondary | ICD-10-CM | POA: Diagnosis not present

## 2019-04-28 NOTE — Telephone Encounter (Signed)
LVM for patient to call and schedule lipid appointment in 3 months with Dr. Debara Pickett.

## 2019-04-28 NOTE — Progress Notes (Signed)
Virtual Visit via Telephone Note   This visit type was conducted due to national recommendations for restrictions regarding the COVID-19 Pandemic (e.g. social distancing) in an effort to limit this patient's exposure and mitigate transmission in our community.  Due to her co-morbid illnesses, this patient is at least at moderate risk for complications without adequate follow up.  This format is felt to be most appropriate for this patient at this time.  The patient did not have access to video technology/had technical difficulties with video requiring transitioning to audio format only (telephone).  All issues noted in this document were discussed and addressed.  No physical exam could be performed with this format.  Please refer to the patient's chart for her  consent to telehealth for [facility].   Evaluation Performed:  Telephone visit  Date:  04/28/2019   ID:  Kaitlyn Jimenez, DOB 07/04/1951, MRN [id number]  Patient Location:  [address] [place] [address]  Provider location:   [address], [address] [place], [place] [address]  PCP:  Guadalupe MapleGage, John F., MD  Cardiologist:  Gypsy Balsamobert Krasowski, MD Electrophysiologist:  None   Chief Complaint:  Dyslipidemia, statin intolerance  History of Present Illness:    Kaitlyn Jimenez is a 68 y.o. female who presents via audio/video conferencing for a telehealth visit today.  This is a 68 year old female kindly referred by Dr. Bing MatterKrasowski with a history of coronary artery disease.  She presented with unstable angina in July and underwent left heart catheterization which demonstrated multivessel coronary disease with moderate 50% diffuse LAD in-stent restenosis, patent left circumflex and severe stenosis of the proximal RCA.  Subsequently she received a synergy drug-eluting stent.  She had previous PCI in 2007 and 2013.  She was started on aspirin and Brilinta for aggressive medical therapy.  At that time her labs indicated a total  cholesterol 252, triglycerides 48, HDL 56 and LDL 186.  Labs are quite concerning for familial hyperlipidemia.  In fact family history also supports this as she has 2 brothers with early onset coronary disease, one had multivessel disease and bypass, 1 sister with marked dyslipidemia and mother who had coronary disease and bypass surgery.  She also has 2 sons, both of which apparently have had lipid testing however she was unclear as to whether they were on lipid-lowering therapy.  In the past she has been on statins from her primary care provider.  She has tried atorvastatin, rosuvastatin, pravastatin, all of which caused significant myalgias.  Subsequently she has discontinued it because of this and has not been on therapy.  Additionally, she noted that she is been having worsening shortness of breath which she attributes to the Brilinta.  She told me that recently she has cut back her dose of Brilinta to once daily which has helped with her shortness of breath.  I explained to her that this could be dangerous because of her recently placed stent and the increased risk of stent thrombosis because she is being undertreated.  She had reached out to Dr. Bing MatterKrasowski who contemplating switching her to Plavix and had prescribed her that medication however she has not started it, wanting to wait to talk to me about it for some reason.  Her last dose of Brilinta was yesterday morning.  She apparently is also not taking amlodipine which she was put on by Dr. Bing MatterKrasowski because she thinks her blood pressure is getting too low with it.  The patient does not have symptoms concerning  for COVID-19 infection (fever, chills, cough, or new SHORTNESS OF BREATH).    Prior CV studies:   The following studies were reviewed today:  Chart reviewed, lab work  PMHx:  Past Medical History:  Diagnosis Date   Hypertension    MI (myocardial infarction) ([place])    MI (myocardial infarction) ([place])    Thyroid disease     Past  Surgical History:  Procedure Laterality Date   ABDOMINAL HYSTERECTOMY     CARDIAC CATHETERIZATION     CHOLECYSTECTOMY     CORONARY ANGIOPLASTY WITH STENT PLACEMENT     LEFT HEART CATH AND CORONARY ANGIOGRAPHY N/A 03/02/2019   Procedure: LEFT HEART CATH AND CORONARY ANGIOGRAPHY;  Surgeon: Sherren Mocha, MD;  Location: [place] [facility];  Service: Cardiovascular;  Laterality: N/A;    FAMHx:  Family History  Problem Relation Age of Onset   Heart disease Mother    Heart attack Father    Heart attack Brother    Congestive Heart Failure Brother    Rheum arthritis Brother    Arrhythmia Sister    Heart disease Maternal Uncle    Heart disease Paternal Uncle     SOCHx:   reports that she has quit smoking. She has never used smokeless tobacco. She reports that she does not drink alcohol or use drugs.  ALLERGIES:  Allergies  Allergen Reactions   Bee Venom Anaphylaxis   Penciclovir Hives   Shellfish-Derived Products Other (See Comments)    Breathing difficulty   Sulfa Antibiotics Hives   Contrast Media [Iodinated Diagnostic Agents]     Breathing difficulty   Penicillins Hives and Rash    Did it involve swelling of the face/tongue/throat, SOB, or low BP? No Did it involve sudden or severe rash/hives, skin peeling, or any reaction on the inside of your mouth or nose? Yes Did you need to seek medical attention at a hospital or doctor's office? No When did it last happen?Pt was a child  If all above answers are NO, may proceed with cephalosporin use.    Ramipril Nausea And Vomiting    pancreatis    MEDS:  Current Meds  Medication Sig   ALPRAZolam (XANAX) 1 MG tablet Take 1 mg 2 (two) times daily as needed by mouth for anxiety.   aspirin EC 81 MG EC tablet Take 1 tablet (81 mg total) by mouth daily.   cetirizine (ZYRTEC) 10 MG tablet Take 10 mg by mouth daily as needed.    Cyanocobalamin (VITAMIN B-12 IJ) Inject 1 Dose as directed every 30 (thirty)  days.   EPINEPHrine 0.3 mg/0.3 mL IJ SOAJ injection Inject 0.3 mg once into the muscle.   famotidine (PEPCID) 20 MG tablet Take 20 mg as needed by mouth for heartburn or indigestion.   levothyroxine (SYNTHROID, LEVOTHROID) 100 MCG tablet Take 1 tablet daily by mouth.   metoprolol tartrate (LOPRESSOR) 50 MG tablet Take 1 tablet (50 mg total) by mouth 2 (two) times daily.   nitroGLYCERIN (NITROSTAT) 0.4 MG SL tablet Place 1 tablet (0.4 mg total) under the tongue every 5 (five) minutes as needed for chest pain.   ticagrelor (BRILINTA) 90 MG TABS tablet Take 90 mg by mouth daily.   valsartan (DIOVAN) 160 MG tablet Take 1 tablet by mouth twice daily. Patient reports only taking second dose if needed.   Vitamin D, Ergocalciferol, (DRISDOL) 1.25 MG (50000 UT) CAPS capsule Take 50,000 Units by mouth every 7 (seven) days.   [DISCONTINUED] amLODipine (NORVASC) 5 MG tablet Take 1  tablet (5 mg total) by mouth daily.     ROS: Pertinent items noted in HPI and remainder of comprehensive ROS otherwise negative.  Labs/Other Tests and Data Reviewed:    Recent Labs: 03/01/2019: TSH 0.439 03/02/2019: ALT 19 03/03/2019: BUN 13; Creatinine, Ser 0.97; Hemoglobin 13.6; Platelets 319; Potassium 4.4; Sodium 136   Recent Lipid Panel Lab Results  Component Value Date/Time   CHOL 252 (H) 03/02/2019 02:53 AM   TRIG 48 03/02/2019 02:53 AM   HDL 56 03/02/2019 02:53 AM   CHOLHDL 4.5 03/02/2019 02:53 AM   LDLCALC 186 (H) 03/02/2019 02:53 AM    Wt Readings from Last 3 Encounters:  04/28/19 170 lb (77.1 kg)  03/14/19 176 lb 12.8 oz (80.2 kg)  03/03/19 176 lb 12.8 oz (80.2 kg)     Exam:    Vital Signs:  BP 124/61    Pulse 70    Wt 170 lb (77.1 kg)    BMI 32.12 kg/m    Exam not performed due to telephone visit  ASSESSMENT & PLAN:    1. Probable FH (based on Simon-Broome criteria) 2. Coronary artery disease status post PCI to the LAD and recently the right coronary artery (02/2019), prior PCI in 2007 and  2013 3. Statin intolerance 4. Strong family history of premature coronary disease in multiple family members on her mother side, brothers and sister  [name] is a 68 year old female with recurrent significant coronary disease and multiple prior coronary interventions.  Most recently she had unstable angina was found to have significant right coronary artery stenosis and ultimately underwent PCI to the right coronary.  She has severely elevated LDL cholesterol which is likely FH.  Based on this, she is a good candidate for PCSK9 inhibitor.  She also has been intolerant to 3 statins including 2 high potency statins causing myalgias.  I would recommend Repatha 140 mg every 2 weeks.  We will plan a repeat lipid profile in 3 to 4 months.  Also check an LP(a) at that time.  She was quite reluctant about the injections but understands that this is the best chance we have to lower her risk and most likely will be best tolerated.  Finally, she reported she has not been compliant with twice daily Brilinta due to shortness of breath.  Her cardiologist apparently is aware of this and prescribed her Plavix however she did not start taking it.  I advised her since her last dose of Brilinta was yesterday to discontinue that, take 300 mg loading dose Plavix today, then 75 mg daily starting tomorrow.  She is also not taking amlodipine which was prescribed by Dr. Bing MatterKrasowski.  Thanks again for the kind referral.  COVID-19 Education: The signs and symptoms of COVID-19 were discussed with the patient and how to seek care for testing (follow up with PCP or arrange E-visit).  The importance of social distancing was discussed today.  Patient Risk:   After full review of this patients clinical status, I feel that they are at least moderate risk at this time.  Time:   Today, I have spent 35 minutes with the patient with telehealth technology discussing dyslipidemia, coronary disease, FH, family history of heart disease,  diet recommendations.     Medication Adjustments/Labs and Tests Ordered: Current medicines are reviewed at length with the patient today.  Concerns regarding medicines are outlined above.   Tests Ordered: No orders of the defined types were placed in this encounter.   Medication Changes: No orders of  the defined types were placed in this encounter.   Disposition:  in 3 month(s)  Chrystie Nose, MD, [facility], FACP  [place]   [facility]  Medical Director of the Advanced Lipid Disorders &  Cardiovascular Risk Reduction Clinic Diplomate of the American Board of Clinical Lipidology Attending Cardiologist  Direct Dial: [phone]   Fax: [phone]  Website:  [email or web address]  Chrystie Nose, MD  04/28/2019 9:39 AM

## 2019-04-28 NOTE — Telephone Encounter (Signed)
Patient called to review e-visit instructions. Patient aware that the following changes have been made:  -- STOP Brilinta, START Plaivx 300mg  on day 1 and then 75mg  daily thereafter -- START Repatha 140mg /mL once approved with insurance  -- explained medication, possible side effects, etc  -- she is very concerned about taking a new medication but is willing to try Patient aware that they will need the following labs: Lipid Panel, LP(a), LFT  Patient aware that they will need the following test(s): NONE  Message sent to [facility] pool to arrange appointment in 3-4 months   No further assistance needed at this time.

## 2019-04-28 NOTE — Patient Instructions (Signed)
Medication Instructions:  STOP brilinta START clopidogrel (plavix) - take 300mg  on first day and then 75mg  thereafter START repatha 140mg /mL once every 14 days  -- this is a self-administered cholesterol medication -- this medication needs to stay in the refrigerator - please take out of refrigerator 30 minutes - 1 hour prior to administration to allow medication to get to room temperature -- administer in an area of fatty tissue (abdomen, outer thigh, back of upper arm) and rotate site each injection -- dispose of use pen-injector in a sharps container (pharmacy can advise on where to take container once filled up)  If you need a refill on your cardiac medications before your next appointment, please call your pharmacy.   Lab work: FASTING lab work to check cholesterol prior to next appointment with Dr. Debara Pickett If you have labs (blood work) drawn today and your tests are completely normal, you will receive your results only by: Marland Kitchen MyChart Message (if you have MyChart) OR . A paper copy in the mail If you have any lab test that is abnormal or we need to change your treatment, we will call you to review the results.  Testing/Procedures: NONE  Follow-Up: Dr. Debara Pickett recommends that you schedule a follow up visit with him the in the [place] in 3-4 months. Please have fasting blood work about 1 week prior to this visit and he will review the blood work results with you at your appointment.

## 2019-04-29 ENCOUNTER — Telehealth: Payer: Self-pay | Admitting: Internal Medicine

## 2019-04-29 MED ORDER — REPATHA SURECLICK 140 MG/ML ~~LOC~~ SOAJ: 1.0000 | SUBCUTANEOUS | 11 refills | Status: DC

## 2019-04-29 NOTE — Telephone Encounter (Signed)
PA for repatha sureclick submitted via [email or web address] ([employer]tho Perl) PA Case: [id number], Status: Approved, Coverage Starts on: 04/29/2019, Coverage Ends on: 10/26/2019

## 2019-04-29 NOTE — Addendum Note (Signed)
Addended by: Fidel Levy on: 04/29/2019 03:38 PM   Modules accepted: Orders

## 2019-04-29 NOTE — Telephone Encounter (Signed)
Patient aware she has been approved for Repatha. She is unsure if she can afford. Provided her with info on how to look into the [employer] co-pay assistance.

## 2019-05-16 ENCOUNTER — Encounter: Payer: Self-pay | Admitting: Cardiology

## 2019-05-16 ENCOUNTER — Ambulatory Visit (INDEPENDENT_AMBULATORY_CARE_PROVIDER_SITE_OTHER): Payer: Medicare HMO | Admitting: Cardiology

## 2019-05-16 ENCOUNTER — Other Ambulatory Visit: Payer: Self-pay

## 2019-05-16 VITALS — BP 164/76 | HR 82 | Ht 61.0 in | Wt 180.6 lb

## 2019-05-16 DIAGNOSIS — I10 Essential (primary) hypertension: Secondary | ICD-10-CM

## 2019-05-16 DIAGNOSIS — E785 Hyperlipidemia, unspecified: Secondary | ICD-10-CM | POA: Diagnosis not present

## 2019-05-16 DIAGNOSIS — I2510 Atherosclerotic heart disease of native coronary artery without angina pectoris: Secondary | ICD-10-CM

## 2019-05-16 MED ORDER — ROSUVASTATIN CALCIUM 5 MG PO TABS: 5.0000 mg | ORAL_TABLET | ORAL | 1 refills | Status: DC

## 2019-05-16 NOTE — Patient Instructions (Signed)
Medication Instructions:  Your physician has recommended you make the following change in your medication:  Start: Crestor 5 mg every other day   If you need a refill on your cardiac medications before your next appointment, please call your pharmacy.   Lab work: None.  If you have labs (blood work) drawn today and your tests are completely normal, you will receive your results only by: Marland Kitchen. MyChart Message (if you have MyChart) OR . A paper copy in the mail If you have any lab test that is abnormal or we need to change your treatment, we will call you to review the results.  Testing/Procedures: None.   Follow-Up: At [facility], you and your health needs are our priority.  As part of our continuing mission to provide you with exceptional heart care, we have created designated Provider Care Teams.  These Care Teams include your primary Cardiologist (physician) and Advanced Practice Providers (APPs -  Physician Assistants and Nurse Practitioners) who all work together to provide you with the care you need, when you need it. You will need a follow up appointment in 2 months.  Please call our office 2 months in advance to schedule this appointment.  You may see Gypsy Balsamobert Krasowski, MD or another member of our [facility] Provider Team in [place]: Norman HerrlichBrian Munley, MD . Belva Cromeajan Revankar, MD  Any Other Special Instructions Will Be Listed Below (If Applicable).  Rosuvastatin Tablets What is this medicine? ROSUVASTATIN (roe SOO va sta tin) is known as a HMG-CoA reductase inhibitor or 'statin'. It lowers cholesterol and triglycerides in the blood. This drug may also reduce the risk of heart attack, stroke, or other health problems in patients with risk factors for heart disease. Diet and lifestyle changes are often used with this drug. This medicine may be used for other purposes; ask your health care provider or pharmacist if you have questions. COMMON BRAND NAME(S): Crestor What should I tell my  health care provider before I take this medicine? They need to know if you have any of these conditions:  diabetes  if you often drink alcohol  history of stroke  kidney disease  liver disease  muscle aches or weakness  thyroid disease  an unusual or allergic reaction to rosuvastatin, other medicines, foods, dyes, or preservatives  pregnant or trying to get pregnant  breast-feeding How should I use this medicine? Take this medicine by mouth with a glass of water. Follow the directions on the prescription label. Do not cut, crush or chew this medicine. You can take this medicine with or without food. Take your doses at regular intervals. Do not take your medicine more often than directed. Talk to your pediatrician regarding the use of this medicine in children. While this drug may be prescribed for children as young as 69 years old for selected conditions, precautions do apply. Overdosage: If you think you have taken too much of this medicine contact a poison control center or emergency room at once. NOTE: This medicine is only for you. Do not share this medicine with others. What if I miss a dose? If you miss a dose, take it as soon as you can. If your next dose is to be taken in less than 12 hours, then do not take the missed dose. Take the next dose at your regular time. Do not take double or extra doses. What may interact with this medicine? Do not take this medicine with any of the following medications:  herbal medicines like red  yeast rice This medicine may also interact with the following medications:  alcohol  antacids containing aluminum hydroxide or magnesium hydroxide  cyclosporine  other medicines for high cholesterol  some medicines for HIV infection  warfarin This list may not describe all possible interactions. Give your health care provider a list of all the medicines, herbs, non-prescription drugs, or dietary supplements you use. Also tell them if you  smoke, drink alcohol, or use illegal drugs. Some items may interact with your medicine. What should I watch for while using this medicine? Visit your doctor or health care professional for regular check-ups. You may need regular tests to make sure your liver is working properly. Your health care professional may tell you to stop taking this medicine if you develop muscle problems. If your muscle problems do not go away after stopping this medicine, contact your health care professional. Do not become pregnant while taking this medicine. Women should inform their health care professional if they wish to become pregnant or think they might be pregnant. There is a potential for serious side effects to an unborn child. Talk to your health care professional or pharmacist for more information. Do not breast-feed an infant while taking this medicine. This medicine may increase blood sugar. Ask your healthcare provider if changes in diet or medicines are needed if you have diabetes. If you are going to need surgery or other procedure, tell your doctor that you are using this medicine. This drug is only part of a total heart-health program. Your doctor or a dietician can suggest a low-cholesterol and low-fat diet to help. Avoid alcohol and smoking, and keep a proper exercise schedule. This medicine may cause a decrease in Co-Enzyme Q-10. You should make sure that you get enough Co-Enzyme Q-10 while you are taking this medicine. Discuss the foods you eat and the vitamins you take with your health care professional. What side effects may I notice from receiving this medicine? Side effects that you should report to your doctor or health care professional as soon as possible:  allergic reactions like skin rash, itching or hives, swelling of the face, lips, or tongue  confusion  joint pain  loss of memory  redness, blistering, peeling or loosening of the skin, including inside the mouth  signs and symptoms  of high blood sugar such as being more thirsty or hungry or having to urinate more than normal. You may also feel very tired or have blurry vision.  signs and symptoms of muscle injury like dark urine; trouble passing urine or change in the amount of urine; unusually weak or tired; muscle pain or side or back pain  yellowing of the eyes or skin Side effects that usually do not require medical attention (report to your doctor or health care professional if they continue or are bothersome):  constipation  diarrhea  dizziness  gas  headache  nausea  stomach pain  trouble sleeping  upset stomach This list may not describe all possible side effects. Call your doctor for medical advice about side effects. You may report side effects to FDA at 1-800-FDA-1088. Where should I keep my medicine? Keep out of the reach of children. Store at room temperature between 20 and 25 degrees C (68 and 77 degrees F). Keep container tightly closed (protect from moisture). Throw away any unused medicine after the expiration date. NOTE: This sheet is a summary. It may not cover all possible information. If you have questions about this medicine, talk to your doctor, pharmacist,  or health care provider.  2020 Elsevier/Gold Standard (2018-06-03 08:25:08)

## 2019-05-16 NOTE — Progress Notes (Signed)
Cardiology Office Note:    Date:  05/16/2019   ID:  Kaitlyn Jimenez, DOB [birth year], MRN [id number]  PCP:  Ocie Doyne., MD  Cardiologist:  Jenne Campus, MD    Referring MD: Ocie Doyne., MD   Chief Complaint  Patient presents with  . Follow-up  Doing well  History of Present Illness:    Kaitlyn Jimenez is a 68 y.o. female comes today to my office for follow-up of all seems to be doing well cardiac wise.  Denies have any chest pain tightness squeezing pressure burning chest.  She was referred to lipid clinic and she was instructed to start taking PCSK9 agent, however, she cannot afford it it would cost her $500 for 1 month and her Social Security is only thousand dollars.  We had a long discussion about the situation.  We talk again about side effects that she had when she was taking statin.  She is willing to try again smaller dose of statin.  Therefore I will try 5 mg of Crestor every other day see if she will be able to tolerate that.  I asked her to let me know if she have some problem with this.  Past Medical History:  Diagnosis Date  . Hypertension   . MI (myocardial infarction) ([place])   . MI (myocardial infarction) ([place])   . Thyroid disease     Past Surgical History:  Procedure Laterality Date  . ABDOMINAL HYSTERECTOMY    . CARDIAC CATHETERIZATION    . CHOLECYSTECTOMY    . CORONARY ANGIOPLASTY WITH STENT PLACEMENT    . LEFT HEART CATH AND CORONARY ANGIOGRAPHY N/A 03/02/2019   Procedure: LEFT HEART CATH AND CORONARY ANGIOGRAPHY;  Surgeon: Sherren Mocha, MD;  Location: [facility];  Service: Cardiovascular;  Laterality: N/A;    Current Medications: Current Meds  Medication Sig  . ALPRAZolam (XANAX) 1 MG tablet Take 1 mg 2 (two) times daily as needed by mouth for anxiety.  Marland Kitchen aspirin EC 81 MG EC tablet Take 1 tablet (81 mg total) by mouth daily.  . cetirizine (ZYRTEC) 10 MG tablet Take 10 mg by mouth daily as needed.   . clopidogrel (PLAVIX) 75 MG tablet  Take 1 tablet (75 mg total) by mouth daily. Take 300 mg the first day back on and then 75 mg daily.  . Cyanocobalamin (VITAMIN B-12 IJ) Inject 1 Dose as directed every 30 (thirty) days.  Marland Kitchen EPINEPHrine 0.3 mg/0.3 mL IJ SOAJ injection Inject 0.3 mg once into the muscle.  . famotidine (PEPCID) 20 MG tablet Take 20 mg as needed by mouth for heartburn or indigestion.  Marland Kitchen levothyroxine (SYNTHROID, LEVOTHROID) 100 MCG tablet Take 1 tablet daily by mouth.  . metoprolol tartrate (LOPRESSOR) 50 MG tablet Take 1 tablet (50 mg total) by mouth 2 (two) times daily.  . nitroGLYCERIN (NITROSTAT) 0.4 MG SL tablet Place 1 tablet (0.4 mg total) under the tongue every 5 (five) minutes as needed for chest pain.  . valsartan (DIOVAN) 160 MG tablet Take 1 tablet (160 mg total) by mouth 2 (two) times daily.  . Vitamin D, Ergocalciferol, (DRISDOL) 1.25 MG (50000 UT) CAPS capsule Take 50,000 Units by mouth every 7 (seven) days.     Allergies:   Bee venom, Penciclovir, Shellfish-derived products, Sulfa antibiotics, Brilinta [ticagrelor], Contrast media [iodinated diagnostic agents], Penicillins, and Ramipril   Social History   Socioeconomic History  . Marital status: Married    Spouse name: Not on file  . Number of  children: Not on file  . Years of education: Not on file  . Highest education level: Not on file  Occupational History  . Not on file  Social Needs  . Financial resource strain: Not on file  . Food insecurity    Worry: Not on file    Inability: Not on file  . Transportation needs    Medical: Not on file    Non-medical: Not on file  Tobacco Use  . Smoking status: Former Games developer  . Smokeless tobacco: Never Used  Substance and Sexual Activity  . Alcohol use: No    Frequency: Never  . Drug use: No  . Sexual activity: Not on file  Lifestyle  . Physical activity    Days per week: Not on file    Minutes per session: Not on file  . Stress: Not on file  Relationships  . Social Musician  on phone: Not on file    Gets together: Not on file    Attends religious service: Not on file    Active member of club or organization: Not on file    Attends meetings of clubs or organizations: Not on file    Relationship status: Not on file  Other Topics Concern  . Not on file  Social History Narrative  . Not on file     Family History: The patient's family history includes Arrhythmia in her sister; Congestive Heart Failure in her brother; Heart attack in her brother and father; Heart disease in her maternal uncle, mother, and paternal uncle; Rheum arthritis in her brother. ROS:   Please see the history of present illness.    All 14 point review of systems negative except as described per history of present illness  EKGs/Labs/Other Studies Reviewed:      Recent Labs: 03/01/2019: TSH 0.439 03/02/2019: ALT 19 03/03/2019: BUN 13; Creatinine, Ser 0.97; Hemoglobin 13.6; Platelets 319; Potassium 4.4; Sodium 136  Recent Lipid Panel    Component Value Date/Time   CHOL 252 (H) 03/02/2019 0253   TRIG 48 03/02/2019 0253   HDL 56 03/02/2019 0253   CHOLHDL 4.5 03/02/2019 0253   VLDL 10 03/02/2019 0253   LDLCALC 186 (H) 03/02/2019 0253    Physical Exam:    VS:  BP (!) 164/76   Pulse 82   Ht 5\' 1"  (1.549 m)   Wt 180 lb 9.6 oz (81.9 kg)   SpO2 98%   BMI 34.12 kg/m     Wt Readings from Last 3 Encounters:  05/16/19 180 lb 9.6 oz (81.9 kg)  04/28/19 170 lb (77.1 kg)  03/14/19 176 lb 12.8 oz (80.2 kg)     GEN:  Well nourished, well developed in no acute distress HEENT: Normal NECK: No JVD; No carotid bruits LYMPHATICS: No lymphadenopathy CARDIAC: RRR, no murmurs, no rubs, no gallops RESPIRATORY:  Clear to auscultation without rales, wheezing or rhonchi  ABDOMEN: Soft, non-tender, non-distended MUSCULOSKELETAL:  No edema; No deformity  SKIN: Warm and dry LOWER EXTREMITIES: no swelling NEUROLOGIC:  Alert and oriented x 3 PSYCHIATRIC:  Normal affect   ASSESSMENT:    1.  Coronary artery disease involving native coronary artery of native heart without angina pectoris   2. Dyslipidemia   3. Essential hypertension    PLAN:    In order of problems listed above:  1. Coronary disease stable on appropriate medication which I will continue.  Her Brilinta has been switched to Plavix because of shortness of breath. 2. Dyslipidemia plan to  try very small dose of statin 5 mg Crestor every other day.  She does have some intolerance to statin but hopefully should be able to tolerate that medication.  She is not able to afford PCSK9 agent. 3. Essential hypertension blood pressure elevated today but at home normal will continue present management.   Overall is a difficult situation.  She does have clearly dyslipidemia cannot afford appropriate medications.   Medication Adjustments/Labs and Tests Ordered: Current medicines are reviewed at length with the patient today.  Concerns regarding medicines are outlined above.  No orders of the defined types were placed in this encounter.  Medication changes: No orders of the defined types were placed in this encounter.   Signed, Georgeanna Leaobert J. Johan Antonacci, MD, [facility] 05/16/2019 3:31 PM    [facility]

## 2019-05-16 NOTE — Addendum Note (Signed)
Addended by: Ashok Norris on: 05/16/2019 03:40 PM   Modules accepted: Orders

## 2019-06-21 DIAGNOSIS — E785 Hyperlipidemia, unspecified: Secondary | ICD-10-CM | POA: Diagnosis not present

## 2019-06-21 DIAGNOSIS — E063 Autoimmune thyroiditis: Secondary | ICD-10-CM | POA: Diagnosis not present

## 2019-06-21 DIAGNOSIS — R7989 Other specified abnormal findings of blood chemistry: Secondary | ICD-10-CM | POA: Diagnosis not present

## 2019-06-21 DIAGNOSIS — I10 Essential (primary) hypertension: Secondary | ICD-10-CM | POA: Diagnosis not present

## 2019-06-21 DIAGNOSIS — E538 Deficiency of other specified B group vitamins: Secondary | ICD-10-CM | POA: Diagnosis not present

## 2019-06-21 DIAGNOSIS — K2100 Gastro-esophageal reflux disease with esophagitis, without bleeding: Secondary | ICD-10-CM | POA: Diagnosis not present

## 2019-06-21 DIAGNOSIS — I2510 Atherosclerotic heart disease of native coronary artery without angina pectoris: Secondary | ICD-10-CM | POA: Diagnosis not present

## 2019-06-21 DIAGNOSIS — F419 Anxiety disorder, unspecified: Secondary | ICD-10-CM | POA: Diagnosis not present

## 2019-06-21 DIAGNOSIS — T63441A Toxic effect of venom of bees, accidental (unintentional), initial encounter: Secondary | ICD-10-CM | POA: Diagnosis not present

## 2019-07-18 ENCOUNTER — Ambulatory Visit: Payer: Medicare HMO | Admitting: Cardiology

## 2019-08-10 ENCOUNTER — Encounter: Payer: Self-pay | Admitting: Cardiology

## 2019-08-10 ENCOUNTER — Ambulatory Visit (INDEPENDENT_AMBULATORY_CARE_PROVIDER_SITE_OTHER): Payer: Medicare HMO | Admitting: Cardiology

## 2019-08-10 ENCOUNTER — Other Ambulatory Visit: Payer: Self-pay

## 2019-08-10 VITALS — BP 144/74 | HR 81 | Ht 61.0 in | Wt 182.0 lb

## 2019-08-10 DIAGNOSIS — E785 Hyperlipidemia, unspecified: Secondary | ICD-10-CM

## 2019-08-10 DIAGNOSIS — R002 Palpitations: Secondary | ICD-10-CM | POA: Diagnosis not present

## 2019-08-10 DIAGNOSIS — I10 Essential (primary) hypertension: Secondary | ICD-10-CM | POA: Diagnosis not present

## 2019-08-10 DIAGNOSIS — I2510 Atherosclerotic heart disease of native coronary artery without angina pectoris: Secondary | ICD-10-CM

## 2019-08-10 DIAGNOSIS — R0789 Other chest pain: Secondary | ICD-10-CM

## 2019-08-10 NOTE — Patient Instructions (Signed)
Medication Instructions:  Your physician recommends that you continue on your current medications as directed. Please refer to the Current Medication list given to you today.  *If you need a refill on your cardiac medications before your next appointment, please call your pharmacy*  Lab Work: Your physician recommends that you return for lab work today: lipids   If you have labs (blood work) drawn today and your tests are completely normal, you will receive your results only by: Marland Kitchen MyChart Message (if you have MyChart) OR . A paper copy in the mail If you have any lab test that is abnormal or we need to change your treatment, we will call you to review the results.  Testing/Procedures: Your physician has recommended that you wear a holter monitor. Holter monitors are medical devices that record the heart's electrical activity. Doctors most often use these monitors to diagnose arrhythmias. Arrhythmias are problems with the speed or rhythm of the heartbeat. The monitor is a small, portable device. You can wear one while you do your normal daily activities. This is usually used to diagnose what is causing palpitations/syncope (passing out). Wear for 7 day     Follow-Up: At [facility], you and your health needs are our priority.  As part of our continuing mission to provide you with exceptional heart care, we have created designated Provider Care Teams.  These Care Teams include your primary Cardiologist (physician) and Advanced Practice Providers (APPs -  Physician Assistants and Nurse Practitioners) who all work together to provide you with the care you need, when you need it.  Your next appointment:   2 month(s)  The format for your next appointment:   In Person  Provider:   Jenne Campus, MD  Other Instructions

## 2019-08-10 NOTE — Progress Notes (Signed)
Cardiology Office Note:    Date:  08/10/2019   ID:  Kaitlyn Jimenez, DOB 09/10/1950, MRN [id number]  PCP:  Ocie Doyne., MD  Cardiologist:  Jenne Campus, MD    Referring MD: Ocie Doyne., MD   Chief Complaint  Patient presents with  . 2 month follow up  Have a lot of palpitations  History of Present Illness:    Kaitlyn Jimenez is a 68 y.o. female with history of coronary artery disease with PTCA and stenting in 2007 2013 and lately in the summer of this year she had PTCA and drug-eluting stent to the right coronary artery.  Initially she had difficulty tolerating Tegretol which gave her shortness of breath we will switch her to Plavix today she is asking if she can stop Plavix because she thinks Plavix gave her palpitations.  I told her this is highly unlikely and simply switch her from taking Plavix in the afternoon to take Plavix in the morning hope that that will make some improvement.  Also palpitations need to be investigated.  I will ask you to have EKG done today if EKG is fine will be made available to increase dose of metoprolol to 75 mg twice daily, I have also will ask her to wear Zio patch to make sure she does not have any worrisome arrhythmia.  Likely she does not have any chest pain, tightness, pressure, burning in the chest.  Past Medical History:  Diagnosis Date  . Hypertension   . MI (myocardial infarction) ([place])   . MI (myocardial infarction) ([place])   . Thyroid disease     Past Surgical History:  Procedure Laterality Date  . ABDOMINAL HYSTERECTOMY    . CARDIAC CATHETERIZATION    . CHOLECYSTECTOMY    . CORONARY ANGIOPLASTY WITH STENT PLACEMENT    . LEFT HEART CATH AND CORONARY ANGIOGRAPHY N/A 03/02/2019   Procedure: LEFT HEART CATH AND CORONARY ANGIOGRAPHY;  Surgeon: Sherren Mocha, MD;  Location: [facility];  Service: Cardiovascular;  Laterality: N/A;    Current Medications: Current Meds  Medication Sig  . ALPRAZolam (XANAX) 1 MG tablet Take  1 mg 2 (two) times daily as needed by mouth for anxiety.  Marland Kitchen aspirin EC 81 MG EC tablet Take 1 tablet (81 mg total) by mouth daily.  . cetirizine (ZYRTEC) 10 MG tablet Take 10 mg by mouth daily as needed.   . clopidogrel (PLAVIX) 75 MG tablet Take 1 tablet (75 mg total) by mouth daily. Take 300 mg the first day back on and then 75 mg daily.  . Cyanocobalamin (VITAMIN B-12 IJ) Inject 1 Dose as directed every 30 (thirty) days.  Marland Kitchen EPINEPHrine 0.3 mg/0.3 mL IJ SOAJ injection Inject 0.3 mg once into the muscle.  . famotidine (PEPCID) 20 MG tablet Take 20 mg as needed by mouth for heartburn or indigestion.  Marland Kitchen levothyroxine (SYNTHROID, LEVOTHROID) 100 MCG tablet Take 1 tablet daily by mouth.  . metoprolol tartrate (LOPRESSOR) 50 MG tablet Take 1 tablet (50 mg total) by mouth 2 (two) times daily.  . nitroGLYCERIN (NITROSTAT) 0.4 MG SL tablet Place 1 tablet (0.4 mg total) under the tongue every 5 (five) minutes as needed for chest pain.  . rosuvastatin (CRESTOR) 5 MG tablet Take 1 tablet (5 mg total) by mouth every other day.  . valsartan (DIOVAN) 160 MG tablet Take 1 tablet (160 mg total) by mouth 2 (two) times daily.  . Vitamin D, Ergocalciferol, (DRISDOL) 1.25 MG (50000 UT) CAPS capsule Take  50,000 Units by mouth every 7 (seven) days.     Allergies:   Bee venom, Penciclovir, Shellfish-derived products, Sulfa antibiotics, Brilinta [ticagrelor], Contrast media [iodinated diagnostic agents], Penicillins, and Ramipril   Social History   Socioeconomic History  . Marital status: Married    Spouse name: Not on file  . Number of children: Not on file  . Years of education: Not on file  . Highest education level: Not on file  Occupational History  . Not on file  Tobacco Use  . Smoking status: Former Games developer  . Smokeless tobacco: Never Used  Substance and Sexual Activity  . Alcohol use: No  . Drug use: No  . Sexual activity: Not on file  Other Topics Concern  . Not on file  Social History Narrative   . Not on file   Social Determinants of Health   Financial Resource Strain:   . Difficulty of Paying Living Expenses: Not on file  Food Insecurity:   . Worried About Programme researcher, broadcasting/film/video in the Last Year: Not on file  . Ran Out of Food in the Last Year: Not on file  Transportation Needs:   . Lack of Transportation (Medical): Not on file  . Lack of Transportation (Non-Medical): Not on file  Physical Activity:   . Days of Exercise per Week: Not on file  . Minutes of Exercise per Session: Not on file  Stress:   . Feeling of Stress : Not on file  Social Connections:   . Frequency of Communication with Friends and Family: Not on file  . Frequency of Social Gatherings with Friends and Family: Not on file  . Attends Religious Services: Not on file  . Active Member of Clubs or Organizations: Not on file  . Attends Banker Meetings: Not on file  . Marital Status: Not on file     Family History: The patient's family history includes Arrhythmia in her sister; Congestive Heart Failure in her brother; Heart attack in her brother and father; Heart disease in her maternal uncle, mother, and paternal uncle; Rheum arthritis in her brother. ROS:   Please see the history of present illness.    All 14 point review of systems negative except as described per history of present illness  EKGs/Labs/Other Studies Reviewed:      Recent Labs: 03/01/2019: TSH 0.439 03/02/2019: ALT 19 03/03/2019: BUN 13; Creatinine, Ser 0.97; Hemoglobin 13.6; Platelets 319; Potassium 4.4; Sodium 136  Recent Lipid Panel    Component Value Date/Time   CHOL 252 (H) 03/02/2019 0253   TRIG 48 03/02/2019 0253   HDL 56 03/02/2019 0253   CHOLHDL 4.5 03/02/2019 0253   VLDL 10 03/02/2019 0253   LDLCALC 186 (H) 03/02/2019 0253    Physical Exam:    VS:  BP (!) 144/74   Pulse 81   Ht 5\' 1"  (1.549 m)   Wt 182 lb (82.6 kg)   SpO2 96%   BMI 34.39 kg/m     Wt Readings from Last 3 Encounters:  08/10/19 182 lb  (82.6 kg)  05/16/19 180 lb 9.6 oz (81.9 kg)  04/28/19 170 lb (77.1 kg)     GEN:  Well nourished, well developed in no acute distress HEENT: Normal NECK: No JVD; No carotid bruits LYMPHATICS: No lymphadenopathy CARDIAC: RRR, no murmurs, no rubs, no gallops RESPIRATORY:  Clear to auscultation without rales, wheezing or rhonchi  ABDOMEN: Soft, non-tender, non-distended MUSCULOSKELETAL:  No edema; No deformity  SKIN: Warm and dry LOWER EXTREMITIES:  no swelling NEUROLOGIC:  Alert and oriented x 3 PSYCHIATRIC:  Normal affect   ASSESSMENT:    1. Coronary artery disease involving native coronary artery of native heart without angina pectoris   2. Dyslipidemia   3. Atypical chest pain   4. Essential hypertension    PLAN:    In order of problems listed above:  1. Coronary artery disease status post PTCA and stenting in the summer of this year of right coronary artery.  Having difficulty tolerating dual antiplatelets therapy.  Plan is as outlined above. 2. Dyslipidemia intolerant to statin she is able to take Crestor 5 mg every other day I will check a fasting lipid profile but I get a feeling that the only solution for this will be PCSK9 agent. 3. Atypical chest pain denies having any. 4. Essential hypertension blood pressure well controlled continue present management. 5. Palpitations EKG, will increase metoprolol, Zio patch will be placed   Medication Adjustments/Labs and Tests Ordered: Current medicines are reviewed at length with the patient today.  Concerns regarding medicines are outlined above.  No orders of the defined types were placed in this encounter.  Medication changes: No orders of the defined types were placed in this encounter.   Signed, Georgeanna Lea, MD, [facility] 08/10/2019 3:28 PM    [facility]

## 2019-08-10 NOTE — Addendum Note (Signed)
Addended by: Ashok Norris on: 08/10/2019 03:40 PM   Modules accepted: Orders

## 2019-08-23 ENCOUNTER — Ambulatory Visit (INDEPENDENT_AMBULATORY_CARE_PROVIDER_SITE_OTHER): Payer: Medicare HMO

## 2019-08-23 DIAGNOSIS — R002 Palpitations: Secondary | ICD-10-CM | POA: Diagnosis not present

## 2019-09-13 DIAGNOSIS — R002 Palpitations: Secondary | ICD-10-CM | POA: Diagnosis not present

## 2019-09-21 ENCOUNTER — Telehealth: Payer: Self-pay | Admitting: Emergency Medicine

## 2019-09-21 NOTE — Telephone Encounter (Signed)
Left message for patient to return call regarding monitor results.  

## 2019-10-11 ENCOUNTER — Encounter: Payer: Self-pay | Admitting: Cardiology

## 2019-10-11 ENCOUNTER — Other Ambulatory Visit: Payer: Self-pay

## 2019-10-11 ENCOUNTER — Ambulatory Visit (INDEPENDENT_AMBULATORY_CARE_PROVIDER_SITE_OTHER): Payer: Medicare HMO | Admitting: Cardiology

## 2019-10-11 VITALS — BP 158/70 | HR 72 | Ht 61.0 in | Wt 181.6 lb

## 2019-10-11 DIAGNOSIS — E785 Hyperlipidemia, unspecified: Secondary | ICD-10-CM

## 2019-10-11 DIAGNOSIS — I10 Essential (primary) hypertension: Secondary | ICD-10-CM

## 2019-10-11 DIAGNOSIS — I2510 Atherosclerotic heart disease of native coronary artery without angina pectoris: Secondary | ICD-10-CM

## 2019-10-11 MED ORDER — PRASUGREL HCL 10 MG PO TABS: 10.0000 mg | ORAL_TABLET | Freq: Every day | ORAL | 2 refills | Status: AC

## 2019-10-11 NOTE — Patient Instructions (Signed)
Medication Instructions:  Your physician has recommended you make the following change in your medication:   START: effient 10 mg daily   INCREASE Metoprolol to 3 times daily AS NEEDED for palpitations.  *If you need a refill on your cardiac medications before your next appointment, please call your pharmacy*  Lab Work: None.  If you have labs (blood work) drawn today and your tests are completely normal, you will receive your results only by: Marland Kitchen MyChart Message (if you have MyChart) OR . A paper copy in the mail If you have any lab test that is abnormal or we need to change your treatment, we will call you to review the results.  Testing/Procedures: None.   Follow-Up: At [facility], you and your health needs are our priority.  As part of our continuing mission to provide you with exceptional heart care, we have created designated Provider Care Teams.  These Care Teams include your primary Cardiologist (physician) and Advanced Practice Providers (APPs -  Physician Assistants and Nurse Practitioners) who all work together to provide you with the care you need, when you need it.  Your next appointment:   2 month(s)  The format for your next appointment:   In Person  Provider:   Gypsy Balsam, MD  Other Instructions  Prasugrel oral tablets What is this medicine? PRASUGREL (PRA soo grel) helps to prevent blood clots. This medicine is used to prevent heart attack, stroke, or other vascular events in people who are at high risk. This medicine may be used for other purposes; ask your health care provider or pharmacist if you have questions. COMMON BRAND NAME(S): Effient What should I tell my health care provider before I take this medicine? They need to know if you have any of these conditions:  bleeding disorders  kidney disease  liver disease  recent trauma or surgery  stomach or intestinal ulcers  stroke or transient ischemic attack  an unusual or allergic  reaction to prasugrel, other medicines, foods, dyes, or preservatives  pregnant or trying to get pregnant  breast-feeding How should I use this medicine? Take this medicine by mouth with a drink of water. Follow the directions on the prescription label. You may take this medicine with or without food. If it upsets your stomach, take it with food. Do not crush, cut, or chew the tablet. If you are not able to take the tablet whole, talk to your prescriber about other ways to take this medicine. Take your medicine at regular intervals. Do not take your medicine more often than directed. Do not stop taking except on your doctor's advice. Talk to your pediatrician regarding the use of this medicine in children. Special care may be needed. Overdosage: If you think you have taken too much of this medicine contact a poison control center or emergency room at once. NOTE: This medicine is only for you. Do not share this medicine with others. What if I miss a dose? If you miss a dose, take it as soon as you can. If it is almost time for your next dose, take only that dose. Do not take double or extra doses. What may interact with this medicine? Do not take this medicine with any of the following medications:  defibrotide This medicine may also interact with the following medications:  certain medicines that treat or prevent blood clots like warfarin  narcotic medicines for pain  NSAIDS, medicines for pain and inflammation, like ibuprofen or naproxen This list may not describe all  possible interactions. Give your health care provider a list of all the medicines, herbs, non-prescription drugs, or dietary supplements you use. Also tell them if you smoke, drink alcohol, or use illegal drugs. Some items may interact with your medicine. What should I watch for while using this medicine? Visit your doctor or health care professional for regular check ups. Do not stop taking your medicine unless your doctor  tells you to. Notify your doctor or health care professional and seek emergency treatment if you develop breathing problems; changes in vision; chest pain; severe, sudden headache; pain, swelling, warmth in the leg; trouble speaking; sudden numbness or weakness of the face, arm, or leg. These can be signs that your condition has gotten worse. If you are going to have surgery or dental work, tell your doctor or health care professional that you are taking this medicine. What side effects may I notice from receiving this medicine? Side effects that you should report to your doctor or health care professional as soon as possible:  allergic reactions like skin rash, itching or hives, swelling of the face, lips, or tongue  signs and symptoms of bleeding such as bloody or black, tarry stools; red or dark-brown urine; spitting up blood or brown material that looks like coffee grounds; red spots on the skin; unusual bruising or bleeding from the eye, gums, or nose Side effects that usually do not require medical attention (report to your doctor or health care professional if they continue or are bothersome):  diarrhea  headache  nausea, vomiting  pain in back, arms or legs This list may not describe all possible side effects. Call your doctor for medical advice about side effects. You may report side effects to FDA at 1-800-FDA-1088. Where should I keep my medicine? Keep out of the reach of children. Store at room temperature between 15 and 30 degrees C (59 and 86 degrees F). Keep this medicine in the original container. Keep container closed and do not remove the gray cylinder from the bottle. Throw away any unused medicine after the expiration date. NOTE: This sheet is a summary. It may not cover all possible information. If you have questions about this medicine, talk to your doctor, pharmacist, or health care provider.  2020 Elsevier/Gold Standard (2017-11-30 17:07:32)

## 2019-10-11 NOTE — Progress Notes (Signed)
Cardiology Office Note:    Date:  10/11/2019   ID:  Kaitlyn Jimenez, DOB Oct 08, 1950, MRN [id number]  PCP:  Ocie Doyne., MD (Inactive)  Cardiologist:  Jenne Campus, MD    Referring MD: Ocie Doyne., MD   No chief complaint on file. I am doing better  History of Present Illness:    Kaitlyn Jimenez is a 69 y.o. female with past medical history significant for coronary artery disease, PTCA and stenting done in 2007, 2013 and recently stent to right coronary artery done at the end of 2020.  She was put very appropriately on dual antiplatelets therapy, she had difficulty tolerating Brilinta, we switch her to Plavix.  However she comes today to my office she said she had to stop Plavix because it make her feel bad.  Denies have any chest pain tightness squeezing pressure burning chest.  She does have multiple allergies to medications.  She also had difficulty tolerating statins.  Finally will be to put on Repatha, however, she tells me she cannot afford this medication.  Situation is quite difficult.  Past Medical History:  Diagnosis Date  . Hypertension   . MI (myocardial infarction) ([place])   . MI (myocardial infarction) ([place])   . Thyroid disease     Past Surgical History:  Procedure Laterality Date  . ABDOMINAL HYSTERECTOMY    . CARDIAC CATHETERIZATION    . CHOLECYSTECTOMY    . CORONARY ANGIOPLASTY WITH STENT PLACEMENT    . LEFT HEART CATH AND CORONARY ANGIOGRAPHY N/A 03/02/2019   Procedure: LEFT HEART CATH AND CORONARY ANGIOGRAPHY;  Surgeon: Sherren Mocha, MD;  Location: [place] [facility];  Service: Cardiovascular;  Laterality: N/A;    Current Medications: Current Meds  Medication Sig  . ALPRAZolam (XANAX) 1 MG tablet Take 1 mg 2 (two) times daily as needed by mouth for anxiety.  Marland Kitchen aspirin EC 81 MG EC tablet Take 1 tablet (81 mg total) by mouth daily.  . cetirizine (ZYRTEC) 10 MG tablet Take 10 mg by mouth daily as needed.   . Cyanocobalamin (VITAMIN B-12 IJ) Inject 1  Dose as directed every 30 (thirty) days.  Marland Kitchen EPINEPHrine 0.3 mg/0.3 mL IJ SOAJ injection Inject 0.3 mg once into the muscle.  . famotidine (PEPCID) 20 MG tablet Take 20 mg as needed by mouth for heartburn or indigestion.  Marland Kitchen levothyroxine (SYNTHROID, LEVOTHROID) 100 MCG tablet Take 1 tablet daily by mouth.  . metoprolol tartrate (LOPRESSOR) 50 MG tablet Take 1 tablet (50 mg total) by mouth 2 (two) times daily.  . nitroGLYCERIN (NITROSTAT) 0.4 MG SL tablet Place 1 tablet (0.4 mg total) under the tongue every 5 (five) minutes as needed for chest pain.  . rosuvastatin (CRESTOR) 5 MG tablet Take 1 tablet (5 mg total) by mouth every other day.  . valsartan (DIOVAN) 160 MG tablet Take 1 tablet (160 mg total) by mouth 2 (two) times daily.  . Vitamin D, Ergocalciferol, (DRISDOL) 1.25 MG (50000 UT) CAPS capsule Take 50,000 Units by mouth every 7 (seven) days.     Allergies:   Bee venom, Penciclovir, Shellfish-derived products, Sulfa antibiotics, Brilinta [ticagrelor], Contrast media [iodinated diagnostic agents], Penicillins, and Ramipril   Social History   Socioeconomic History  . Marital status: Married    Spouse name: Not on file  . Number of children: Not on file  . Years of education: Not on file  . Highest education level: Not on file  Occupational History  . Not on file  Tobacco  Use  . Smoking status: Former Smoker  . Smokeless tobacco: Never Used  Substance and Sexual Activity  . Alcohol use: No  . Drug use: No  . Sexual activity: Not on file  Other Topics Concern  . Not on file  Social History Narrative  . Not on file   Social Determinants of Health   Financial Resource Strain:   . Difficulty of Paying Living Expenses: Not on file  Food Insecurity:   . Worried About Programme researcher, broadcasting/film/video in the Last Year: Not on file  . Ran Out of Food in the Last Year: Not on file  Transportation Needs:   . Lack of Transportation (Medical): Not on file  . Lack of Transportation (Non-Medical):  Not on file  Physical Activity:   . Days of Exercise per Week: Not on file  . Minutes of Exercise per Session: Not on file  Stress:   . Feeling of Stress : Not on file  Social Connections:   . Frequency of Communication with Friends and Family: Not on file  . Frequency of Social Gatherings with Friends and Family: Not on file  . Attends Religious Services: Not on file  . Active Member of Clubs or Organizations: Not on file  . Attends Banker Meetings: Not on file  . Marital Status: Not on file     Family History: The patient's family history includes Arrhythmia in her sister; Congestive Heart Failure in her brother; Heart attack in her brother and father; Heart disease in her maternal uncle, mother, and paternal uncle; Rheum arthritis in her brother. ROS:   Please see the history of present illness.    All 14 point review of systems negative except as described per history of present illness  EKGs/Labs/Other Studies Reviewed:      Recent Labs: 03/01/2019: TSH 0.439 03/02/2019: ALT 19 03/03/2019: BUN 13; Creatinine, Ser 0.97; Hemoglobin 13.6; Platelets 319; Potassium 4.4; Sodium 136  Recent Lipid Panel    Component Value Date/Time   CHOL 252 (H) 03/02/2019 0253   TRIG 48 03/02/2019 0253   HDL 56 03/02/2019 0253   CHOLHDL 4.5 03/02/2019 0253   VLDL 10 03/02/2019 0253   LDLCALC 186 (H) 03/02/2019 0253    Physical Exam:    VS:  BP (!) 158/70   Pulse 72   Ht 5\' 1"  (1.549 m)   Wt 181 lb 9.6 oz (82.4 kg)   SpO2 95%   BMI 34.31 kg/m     Wt Readings from Last 3 Encounters:  10/11/19 181 lb 9.6 oz (82.4 kg)  08/10/19 182 lb (82.6 kg)  05/16/19 180 lb 9.6 oz (81.9 kg)     GEN:  Well nourished, well developed in no acute distress HEENT: Normal NECK: No JVD; No carotid bruits LYMPHATICS: No lymphadenopathy CARDIAC: RRR, no murmurs, no rubs, no gallops RESPIRATORY:  Clear to auscultation without rales, wheezing or rhonchi  ABDOMEN: Soft, non-tender,  non-distended MUSCULOSKELETAL:  No edema; No deformity  SKIN: Warm and dry LOWER EXTREMITIES: no swelling NEUROLOGIC:  Alert and oriented x 3 PSYCHIATRIC:  Normal affect   ASSESSMENT:    1. Coronary artery disease involving native coronary artery of native heart without angina pectoris   2. Essential hypertension   3. Dyslipidemia    PLAN:    In order of problems listed above:  1. Coronary disease with multiple interventions.  I will put her on Effient we will try to complete 12 months of dual antiplatelet therapy, and ideally because  of her multiple risk factors that are not well modified she need to be on dual antiplatelets therapy indefinitely.  We will see how she will be able to tolerate Effient and based on that we will decide what to do next.  She does not have history of CVA, therefore there is no contraindication for aspirin. 2. Essential hypertension blood pressure elevated today.  I will increase dose of beta-blocker she will take 150 mg daily of metoprolol. 3. Dyslipidemia very difficult situation takes Crestor every other day.  Only 5 mg.  Does not want to take anymore.  He cannot afford Repatha. 4. Palpitations.  Her monitor showed runs of supraventricular tachycardia.  We will try to increase beta-blocker hopefully it will take care of the problem.  Overall is quite a difficult situation she got intolerance to multiple medications.  Cannot afford Repatha, cannot tolerate Plavix as well as Brilinta.  Will try Effient.   Medication Adjustments/Labs and Tests Ordered: Current medicines are reviewed at length with the patient today.  Concerns regarding medicines are outlined above.  No orders of the defined types were placed in this encounter.  Medication changes: No orders of the defined types were placed in this encounter.   Signed, Georgeanna Lea, MD, [facility] 10/11/2019 2:54 PM    [facility]

## 2019-10-28 ENCOUNTER — Telehealth: Payer: Self-pay | Admitting: Internal Medicine

## 2019-10-28 NOTE — Telephone Encounter (Signed)
Called patient about Repatha renewal being due soon. She has been unable to afford Repatha and is on crestor at low dose, a few times weekly per primary cardiologist. Advised her to apply for [employer] grant (was previously notified of this in 04/2019). Explained the application process and how the grant will work. Asked that she notify me if she is approved so we can renew her Repatha authorization and get a Rx to her pharmacy.

## 2019-12-20 DIAGNOSIS — E063 Autoimmune thyroiditis: Secondary | ICD-10-CM | POA: Diagnosis not present

## 2019-12-20 DIAGNOSIS — E785 Hyperlipidemia, unspecified: Secondary | ICD-10-CM | POA: Diagnosis not present

## 2019-12-20 DIAGNOSIS — R21 Rash and other nonspecific skin eruption: Secondary | ICD-10-CM | POA: Diagnosis not present

## 2019-12-20 DIAGNOSIS — I10 Essential (primary) hypertension: Secondary | ICD-10-CM | POA: Diagnosis not present

## 2019-12-20 DIAGNOSIS — E538 Deficiency of other specified B group vitamins: Secondary | ICD-10-CM | POA: Diagnosis not present

## 2019-12-20 DIAGNOSIS — R7989 Other specified abnormal findings of blood chemistry: Secondary | ICD-10-CM | POA: Diagnosis not present

## 2019-12-20 DIAGNOSIS — F419 Anxiety disorder, unspecified: Secondary | ICD-10-CM | POA: Diagnosis not present

## 2019-12-20 DIAGNOSIS — I2510 Atherosclerotic heart disease of native coronary artery without angina pectoris: Secondary | ICD-10-CM | POA: Diagnosis not present

## 2019-12-20 DIAGNOSIS — Z9181 History of falling: Secondary | ICD-10-CM | POA: Diagnosis not present

## 2019-12-26 ENCOUNTER — Ambulatory Visit: Payer: Medicare HMO | Admitting: Cardiology

## 2020-01-19 DIAGNOSIS — L300 Nummular dermatitis: Secondary | ICD-10-CM | POA: Diagnosis not present

## 2020-01-19 DIAGNOSIS — L0100 Impetigo, unspecified: Secondary | ICD-10-CM | POA: Diagnosis not present

## 2020-01-19 DIAGNOSIS — L299 Pruritus, unspecified: Secondary | ICD-10-CM | POA: Diagnosis not present

## 2020-02-02 DIAGNOSIS — L300 Nummular dermatitis: Secondary | ICD-10-CM | POA: Diagnosis not present

## 2020-02-17 ENCOUNTER — Other Ambulatory Visit: Payer: Self-pay

## 2020-02-21 DIAGNOSIS — J301 Allergic rhinitis due to pollen: Secondary | ICD-10-CM | POA: Diagnosis not present

## 2020-02-21 DIAGNOSIS — E063 Autoimmune thyroiditis: Secondary | ICD-10-CM | POA: Diagnosis not present

## 2020-02-21 DIAGNOSIS — R7989 Other specified abnormal findings of blood chemistry: Secondary | ICD-10-CM | POA: Diagnosis not present

## 2020-02-21 DIAGNOSIS — E785 Hyperlipidemia, unspecified: Secondary | ICD-10-CM | POA: Diagnosis not present

## 2020-02-21 DIAGNOSIS — Z6831 Body mass index (BMI) 31.0-31.9, adult: Secondary | ICD-10-CM | POA: Diagnosis not present

## 2020-02-21 DIAGNOSIS — R739 Hyperglycemia, unspecified: Secondary | ICD-10-CM | POA: Diagnosis not present

## 2020-02-21 DIAGNOSIS — K2100 Gastro-esophageal reflux disease with esophagitis, without bleeding: Secondary | ICD-10-CM | POA: Diagnosis not present

## 2020-02-21 DIAGNOSIS — I2510 Atherosclerotic heart disease of native coronary artery without angina pectoris: Secondary | ICD-10-CM | POA: Diagnosis not present

## 2020-02-21 DIAGNOSIS — I10 Essential (primary) hypertension: Secondary | ICD-10-CM | POA: Diagnosis not present

## 2020-02-23 ENCOUNTER — Encounter: Payer: Self-pay | Admitting: Cardiology

## 2020-02-23 ENCOUNTER — Ambulatory Visit (INDEPENDENT_AMBULATORY_CARE_PROVIDER_SITE_OTHER): Payer: Medicare HMO | Admitting: Cardiology

## 2020-02-23 ENCOUNTER — Other Ambulatory Visit: Payer: Self-pay

## 2020-02-23 VITALS — BP 170/78 | HR 80 | Ht 61.0 in | Wt 180.8 lb

## 2020-02-23 DIAGNOSIS — I2510 Atherosclerotic heart disease of native coronary artery without angina pectoris: Secondary | ICD-10-CM

## 2020-02-23 DIAGNOSIS — I10 Essential (primary) hypertension: Secondary | ICD-10-CM | POA: Diagnosis not present

## 2020-02-23 DIAGNOSIS — R0789 Other chest pain: Secondary | ICD-10-CM

## 2020-02-23 DIAGNOSIS — E785 Hyperlipidemia, unspecified: Secondary | ICD-10-CM | POA: Diagnosis not present

## 2020-02-23 MED ORDER — VALSARTAN 160 MG PO TABS: 160.0000 mg | ORAL_TABLET | Freq: Every day | ORAL | 1 refills | Status: DC

## 2020-02-23 MED ORDER — PRAVASTATIN SODIUM 40 MG PO TABS: 40.0000 mg | ORAL_TABLET | Freq: Every evening | ORAL | 1 refills | Status: DC

## 2020-02-23 MED ORDER — VALSARTAN 160 MG PO TABS: 160.0000 mg | ORAL_TABLET | Freq: Every day | ORAL | 1 refills | Status: AC

## 2020-02-23 NOTE — Patient Instructions (Signed)
Medication Instructions:  Your physician has recommended you make the following change in your medication:  STOP: crestor  START: Pravastatin 40 mg daily  *If you need a refill on your cardiac medications before your next appointment, please call your pharmacy*   Lab Work: None.  If you have labs (blood work) drawn today and your tests are completely normal, you will receive your results only by: Marland Kitchen MyChart Message (if you have MyChart) OR . A paper copy in the mail If you have any lab test that is abnormal or we need to change your treatment, we will call you to review the results.   Testing/Procedures: None.    Follow-Up: At [facility], you and your health needs are our priority.  As part of our continuing mission to provide you with exceptional heart care, we have created designated Provider Care Teams.  These Care Teams include your primary Cardiologist (physician) and Advanced Practice Providers (APPs -  Physician Assistants and Nurse Practitioners) who all work together to provide you with the care you need, when you need it.  We recommend signing up for the patient portal called "MyChart".  Sign up information is provided on this After Visit Summary.  MyChart is used to connect with patients for Virtual Visits (Telemedicine).  Patients are able to view lab/test results, encounter notes, upcoming appointments, etc.  Non-urgent messages can be sent to your provider as well.   To learn more about what you can do with MyChart, go to [email or web address].    Your next appointment:   5 month(s)  The format for your next appointment:   In Person  Provider:   Gypsy Balsam, MD   Other Instructions

## 2020-02-23 NOTE — Progress Notes (Signed)
Cardiology Office Note:    Date:  02/23/2020   ID:  Kaitlyn Jimenez, DOB 19-Mar-1951, MRN [id number]  PCP:  Gordy Councilman, FNP  Cardiologist:  Gypsy Balsam, MD    Referring MD: No ref. provider found   No chief complaint on file. Doing fine  History of Present Illness:    [name] is a 69 y.o. female with past medical history significant for coronary artery disease, she did have PTCA and stenting done in 2007, 2013 and recently stenting to the right coronary artery done in December 2020.  She was very appropriately put on dual antiplatelets therapy, however, she was not able to tolerate Brilinta after that she was switched to Plavix and also have some difficulty taking Plavix last time I seen her we had a plan to switch her from Plavix to Effient, however apparently her insurance company informed her that she was allergic to Effient and finally she was able to take Plavix.  This is more than a year after her stent implantation she is taking only aspirin.  We had a long discussion about her situation which is very difficult.  In my opinion, ideally she need to be on dual antiplatelets therapy indefinitely, however because of intolerance to Effient as well as Brilinta the only choice was with the Plavix that she does not want to continue she tells me that she does not feel well on this medication.  Another issue that we discussed at length today was issue of of her cholesterol-lowering medication.  She is taking Crestor only 5 mg 3 times a week she said that her cholesterol was just recently checked by primary care physician and was actually pretty bad.  We were talking about potentially augmenting this therapy she does not want to do it.  However, she tells me that last time when she was taking pravastatin she was doing quite well and she is willing to switch from 3 times a week to every single day of pravastatin.  We also discussed in length the issue of PCSK9 agent.  She gave me all  different excuses why she does not want to use it the most important what appears to be the fact that she is simply scared of it.  She is worried about pricing she is worried about potential side effect of it.  Past Medical History:  Diagnosis Date  . Hypertension   . MI (myocardial infarction) ([facility])   . MI (myocardial infarction) ([facility])   . Thyroid disease     Past Surgical History:  Procedure Laterality Date  . ABDOMINAL HYSTERECTOMY    . CARDIAC CATHETERIZATION    . CHOLECYSTECTOMY    . CORONARY ANGIOPLASTY WITH STENT PLACEMENT    . LEFT HEART CATH AND CORONARY ANGIOGRAPHY N/A 03/02/2019   Procedure: LEFT HEART CATH AND CORONARY ANGIOGRAPHY;  Surgeon: Tonny Bollman, MD;  Location: [facility] INVASIVE CV LAB;  Service: Cardiovascular;  Laterality: N/A;    Current Medications: Current Meds  Medication Sig  . ALPRAZolam (XANAX) 1 MG tablet Take 1 mg 2 (two) times daily as needed by mouth for anxiety.  Marland Kitchen aspirin EC 81 MG EC tablet Take 1 tablet (81 mg total) by mouth daily.  . cetirizine (ZYRTEC) 10 MG tablet Take 10 mg by mouth daily as needed.   . clopidogrel (PLAVIX) 75 MG tablet Take 1 tablet by mouth daily.  . Cyanocobalamin (VITAMIN B-12 IJ) Inject 1 Dose as directed every 30 (thirty) days.  Marland Kitchen EPINEPHrine 0.3 mg/0.3 mL  IJ SOAJ injection Inject 0.3 mg once into the muscle.  . famotidine (PEPCID) 20 MG tablet Take 20 mg as needed by mouth for heartburn or indigestion.  Marland Kitchen levothyroxine (SYNTHROID, LEVOTHROID) 100 MCG tablet Take 1 tablet daily by mouth.  . metoprolol tartrate (LOPRESSOR) 50 MG tablet Take 1 tablet (50 mg total) by mouth 2 (two) times daily. (Patient taking differently: Take 25 mg by mouth 2 (two) times daily. Take half tablet in the morning and half tablet at night.)  . nitroGLYCERIN (NITROSTAT) 0.4 MG SL tablet Place 1 tablet (0.4 mg total) under the tongue every 5 (five) minutes as needed for chest pain.  . rosuvastatin (CRESTOR) 5 MG tablet Take 1 tablet (5 mg total) by  mouth every other day.  . valsartan (DIOVAN) 160 MG tablet Take 1 tablet (160 mg total) by mouth 2 (two) times daily. (Patient taking differently: Take 80 mg by mouth daily. )  . Vitamin D, Ergocalciferol, (DRISDOL) 1.25 MG (50000 UT) CAPS capsule Take 50,000 Units by mouth every 7 (seven) days.     Allergies:   Bee venom, Penciclovir, Shellfish-derived products, Sulfa antibiotics, Brilinta [ticagrelor], Contrast media [iodinated diagnostic agents], Penicillins, and Ramipril   Social History   Socioeconomic History  . Marital status: Married    Spouse name: Not on file  . Number of children: Not on file  . Years of education: Not on file  . Highest education level: Not on file  Occupational History  . Not on file  Tobacco Use  . Smoking status: Former Games developer  . Smokeless tobacco: Never Used  Vaping Use  . Vaping Use: Never used  Substance and Sexual Activity  . Alcohol use: No  . Drug use: No  . Sexual activity: Not on file  Other Topics Concern  . Not on file  Social History Narrative  . Not on file   Social Determinants of Health   Financial Resource Strain:   . Difficulty of Paying Living Expenses:   Food Insecurity:   . Worried About Programme researcher, broadcasting/film/video in the Last Year:   . Barista in the Last Year:   Transportation Needs:   . Freight forwarder (Medical):   Marland Kitchen Lack of Transportation (Non-Medical):   Physical Activity:   . Days of Exercise per Week:   . Minutes of Exercise per Session:   Stress:   . Feeling of Stress :   Social Connections:   . Frequency of Communication with Friends and Family:   . Frequency of Social Gatherings with Friends and Family:   . Attends Religious Services:   . Active Member of Clubs or Organizations:   . Attends Banker Meetings:   Marland Kitchen Marital Status:      Family History: The patient's family history includes Arrhythmia in her sister; Congestive Heart Failure in her brother; Heart attack in her brother  and father; Heart disease in her maternal uncle, mother, and paternal uncle; Rheum arthritis in her brother. ROS:   Please see the history of present illness.    All 14 point review of systems negative except as described per history of present illness  EKGs/Labs/Other Studies Reviewed:      Recent Labs: 03/01/2019: TSH 0.439 03/02/2019: ALT 19 03/03/2019: BUN 13; Creatinine, Ser 0.97; Hemoglobin 13.6; Platelets 319; Potassium 4.4; Sodium 136  Recent Lipid Panel    Component Value Date/Time   CHOL 252 (H) 03/02/2019 0253   TRIG 48 03/02/2019 0253   HDL 56  03/02/2019 0253   CHOLHDL 4.5 03/02/2019 0253   VLDL 10 03/02/2019 0253   LDLCALC 186 (H) 03/02/2019 0253    Physical Exam:    VS:  BP (!) 170/78   Pulse 80   Ht 5\' 1"  (1.549 m)   Wt 180 lb 12.8 oz (82 kg)   SpO2 98%   BMI 34.16 kg/m     Wt Readings from Last 3 Encounters:  02/23/20 180 lb 12.8 oz (82 kg)  10/11/19 181 lb 9.6 oz (82.4 kg)  08/10/19 182 lb (82.6 kg)     GEN:  Well nourished, well developed in no acute distress HEENT: Normal NECK: No JVD; No carotid bruits LYMPHATICS: No lymphadenopathy CARDIAC: RRR, no murmurs, no rubs, no gallops RESPIRATORY:  Clear to auscultation without rales, wheezing or rhonchi  ABDOMEN: Soft, non-tender, non-distended MUSCULOSKELETAL:  No edema; No deformity  SKIN: Warm and dry LOWER EXTREMITIES: no swelling NEUROLOGIC:  Alert and oriented x 3 PSYCHIATRIC:  Normal affect   ASSESSMENT:    1. Coronary artery disease involving native coronary artery of native heart without angina pectoris   2. Dyslipidemia   3. Atypical chest pain   4. Essential hypertension    PLAN:    In order of problems listed above:  1. Coronary artery disease status post multiple stenting with very high cholesterol which is not well managed because of intolerance to multiple medication.  We will continue monitoring her and will try to do the best we can to prevent her from having problems in the  future but I told her straight that with this kind of cholesterol she will end up having some events in the future. 2. Dyslipidemia: Discussion as above.  She is going to change Crestor to pravastatin daily 40 mg.  Will recheck fasting lipid profile in 6 weeks, I tried to convince her to take at least 1 dose of Repatha and see if she is able to tolerate it she is very reluctant but she promised me to think about it.  She will let me know what the decision will be. 3. Atypical chest pain: Denies having any. 4. Essential hypertension blood pressure is elevated today but she check his blood pressure at home and it is usually 132/70.  We will continue present management.  It was very long visit we discussed multiple issues hopefully will be able to make some progress.   Medication Adjustments/Labs and Tests Ordered: Current medicines are reviewed at length with the patient today.  Concerns regarding medicines are outlined above.  No orders of the defined types were placed in this encounter.  Medication changes: No orders of the defined types were placed in this encounter.   Signed, 08/12/19, MD, [facility] 02/23/2020 2:40 PM    [facility]

## 2020-04-13 DIAGNOSIS — J3489 Other specified disorders of nose and nasal sinuses: Secondary | ICD-10-CM | POA: Diagnosis not present

## 2020-04-13 DIAGNOSIS — Z20828 Contact with and (suspected) exposure to other viral communicable diseases: Secondary | ICD-10-CM | POA: Diagnosis not present

## 2020-04-17 DIAGNOSIS — I10 Essential (primary) hypertension: Secondary | ICD-10-CM | POA: Diagnosis not present

## 2020-04-17 DIAGNOSIS — F419 Anxiety disorder, unspecified: Secondary | ICD-10-CM | POA: Diagnosis not present

## 2020-04-17 DIAGNOSIS — Z9049 Acquired absence of other specified parts of digestive tract: Secondary | ICD-10-CM | POA: Diagnosis not present

## 2020-04-17 DIAGNOSIS — R112 Nausea with vomiting, unspecified: Secondary | ICD-10-CM | POA: Diagnosis not present

## 2020-04-17 DIAGNOSIS — I252 Old myocardial infarction: Secondary | ICD-10-CM | POA: Diagnosis not present

## 2020-04-17 DIAGNOSIS — Z888 Allergy status to other drugs, medicaments and biological substances status: Secondary | ICD-10-CM | POA: Diagnosis not present

## 2020-04-17 DIAGNOSIS — Z882 Allergy status to sulfonamides status: Secondary | ICD-10-CM | POA: Diagnosis not present

## 2020-04-17 DIAGNOSIS — U071 COVID-19: Secondary | ICD-10-CM | POA: Diagnosis not present

## 2020-04-17 DIAGNOSIS — E871 Hypo-osmolality and hyponatremia: Secondary | ICD-10-CM | POA: Diagnosis not present

## 2020-05-21 DIAGNOSIS — Z5321 Procedure and treatment not carried out due to patient leaving prior to being seen by health care provider: Secondary | ICD-10-CM | POA: Diagnosis not present

## 2020-05-21 DIAGNOSIS — M79605 Pain in left leg: Secondary | ICD-10-CM | POA: Diagnosis not present

## 2020-05-21 DIAGNOSIS — M7122 Synovial cyst of popliteal space [Baker], left knee: Secondary | ICD-10-CM | POA: Diagnosis not present

## 2020-05-25 DIAGNOSIS — I10 Essential (primary) hypertension: Secondary | ICD-10-CM | POA: Insufficient documentation

## 2020-05-25 DIAGNOSIS — E038 Other specified hypothyroidism: Secondary | ICD-10-CM

## 2020-05-25 DIAGNOSIS — E039 Hypothyroidism, unspecified: Secondary | ICD-10-CM | POA: Insufficient documentation

## 2020-05-25 DIAGNOSIS — K219 Gastro-esophageal reflux disease without esophagitis: Secondary | ICD-10-CM

## 2020-05-25 DIAGNOSIS — M25562 Pain in left knee: Secondary | ICD-10-CM | POA: Diagnosis not present

## 2020-05-25 DIAGNOSIS — M1712 Unilateral primary osteoarthritis, left knee: Secondary | ICD-10-CM | POA: Diagnosis not present

## 2020-05-25 DIAGNOSIS — E7849 Other hyperlipidemia: Secondary | ICD-10-CM

## 2020-05-25 DIAGNOSIS — M7122 Synovial cyst of popliteal space [Baker], left knee: Secondary | ICD-10-CM | POA: Diagnosis not present

## 2020-05-25 HISTORY — DX: Gastro-esophageal reflux disease without esophagitis: K21.9

## 2020-05-25 HISTORY — DX: Other hyperlipidemia: E78.49

## 2020-05-25 HISTORY — DX: Other specified hypothyroidism: E03.8

## 2020-05-25 HISTORY — DX: Essential (primary) hypertension: I10

## 2020-07-02 DIAGNOSIS — F419 Anxiety disorder, unspecified: Secondary | ICD-10-CM | POA: Diagnosis not present

## 2020-07-02 DIAGNOSIS — R7989 Other specified abnormal findings of blood chemistry: Secondary | ICD-10-CM | POA: Diagnosis not present

## 2020-07-02 DIAGNOSIS — I2510 Atherosclerotic heart disease of native coronary artery without angina pectoris: Secondary | ICD-10-CM | POA: Diagnosis not present

## 2020-07-02 DIAGNOSIS — E063 Autoimmune thyroiditis: Secondary | ICD-10-CM | POA: Diagnosis not present

## 2020-07-02 DIAGNOSIS — E538 Deficiency of other specified B group vitamins: Secondary | ICD-10-CM | POA: Diagnosis not present

## 2020-07-02 DIAGNOSIS — I10 Essential (primary) hypertension: Secondary | ICD-10-CM | POA: Diagnosis not present

## 2020-07-02 DIAGNOSIS — Z6831 Body mass index (BMI) 31.0-31.9, adult: Secondary | ICD-10-CM | POA: Diagnosis not present

## 2020-07-02 DIAGNOSIS — Z1331 Encounter for screening for depression: Secondary | ICD-10-CM | POA: Diagnosis not present

## 2020-07-02 DIAGNOSIS — R739 Hyperglycemia, unspecified: Secondary | ICD-10-CM | POA: Diagnosis not present

## 2020-07-02 DIAGNOSIS — E785 Hyperlipidemia, unspecified: Secondary | ICD-10-CM | POA: Diagnosis not present

## 2020-07-02 DIAGNOSIS — Z139 Encounter for screening, unspecified: Secondary | ICD-10-CM | POA: Diagnosis not present

## 2020-07-23 ENCOUNTER — Other Ambulatory Visit: Payer: Self-pay

## 2020-07-23 DIAGNOSIS — I219 Acute myocardial infarction, unspecified: Secondary | ICD-10-CM | POA: Insufficient documentation

## 2020-07-25 ENCOUNTER — Other Ambulatory Visit: Payer: Self-pay

## 2020-07-25 ENCOUNTER — Ambulatory Visit: Payer: Medicare HMO | Admitting: Cardiology

## 2020-07-25 ENCOUNTER — Encounter: Payer: Self-pay | Admitting: Cardiology

## 2020-07-25 VITALS — BP 160/84 | HR 77 | Ht 61.0 in | Wt 179.0 lb

## 2020-07-25 DIAGNOSIS — R0789 Other chest pain: Secondary | ICD-10-CM | POA: Diagnosis not present

## 2020-07-25 DIAGNOSIS — I25119 Atherosclerotic heart disease of native coronary artery with unspecified angina pectoris: Secondary | ICD-10-CM | POA: Diagnosis not present

## 2020-07-25 DIAGNOSIS — I10 Essential (primary) hypertension: Secondary | ICD-10-CM

## 2020-07-25 DIAGNOSIS — E785 Hyperlipidemia, unspecified: Secondary | ICD-10-CM

## 2020-07-25 NOTE — Patient Instructions (Signed)

## 2020-07-25 NOTE — Progress Notes (Signed)
Cardiology Office Note:    Date:  07/25/2020   ID:  Kaitlyn Jimenez, DOB [birth year], MRN [id number]  PCP:  Gordy Councilman, FNP  Cardiologist:  Gypsy Balsam, MD    Referring MD: Gordy Councilman, FNP   Chief Complaint  Patient presents with  . Follow-up  I had Covid infection in August  History of Present Illness:    Kaitlyn Jimenez is a 69 y.o. female with past medical history significant for coronary artery disease, she did have PTCA and stenting done in 2007, 2013 and recently stenting to the right coronary artery done in December 2020.  She was very appropriately put on dual antiplatelets therapy, however, she was not able to tolerate Brilinta after that she was switched to Plavix and also have some difficulty taking Plavix last time I seen her we had a plan to switch her from Plavix to Effient, however apparently her insurance company informed her that she was allergic to Effient and finally she was able to take Plavix.  This is more than a year after her stent implantation she is taking only aspirin.  We had a long discussion about her situation which is very difficult.  In my opinion, ideally she need to be on dual antiplatelets therapy indefinitely, however because of intolerance to Effient as well as Brilinta the only choice was with the Plavix that she does not want to continue she tells me that she does not feel well on this medication.  Another issue that we discussed at length today was issue of of her cholesterol-lowering medication. Comes today 2 months of follow-up.  In August she ended up having COVID-19 infection her entire family got sick, they would not vaccinated.  Her husband spent 21 days in the hospital her sister got permanent injury to her lungs she is doing quite well recovering from her Covid.  Still complain of having some situation that she is very weak tired and exhausted and also sweaty.  Denies have any chest pain tightness squeezing pressure burning  chest  Past Medical History:  Diagnosis Date  . Atypical chest pain 01/07/2019  . Coronary artery disease involving native coronary artery of native heart with angina pectoris (HCC) 07/13/2017   Stenting in 2007, 2013 to LAD, last cardiac cath in 02/2019 w/ prox RCA stent and 50% ISR of LAD stent  . Dyslipidemia 07/13/2017  . Essential hypertension 07/13/2017  . Gastroesophageal reflux disease without esophagitis 05/25/2020  . Hypertension   . MI (myocardial infarction) (HCC)   . MI (myocardial infarction) (HCC)   . Other hyperlipidemia 05/25/2020  . Other specified hypothyroidism 05/25/2020  . Primary hypertension 05/25/2020  . Thyroid disease   . Unstable angina (HCC) 03/01/2019    Past Surgical History:  Procedure Laterality Date  . ABDOMINAL HYSTERECTOMY    . CARDIAC CATHETERIZATION    . CHOLECYSTECTOMY    . CORONARY ANGIOPLASTY WITH STENT PLACEMENT    . LEFT HEART CATH AND CORONARY ANGIOGRAPHY N/A 03/02/2019   Procedure: LEFT HEART CATH AND CORONARY ANGIOGRAPHY;  Surgeon: Tonny Bollman, MD;  Location: [facility] INVASIVE CV LAB;  Service: Cardiovascular;  Laterality: N/A;    Current Medications: Current Meds  Medication Sig  . ALPRAZolam (XANAX) 1 MG tablet Take 1 mg 2 (two) times daily as needed by mouth for anxiety.  Marland Kitchen aspirin EC 81 MG EC tablet Take 1 tablet (81 mg total) by mouth daily.  . Cyanocobalamin (VITAMIN B-12 IJ) Inject 1 Dose as directed every 30 (thirty) days.  Marland Kitchen  EPINEPHrine 0.3 mg/0.3 mL IJ SOAJ injection Inject 0.3 mg once into the muscle.  . famotidine (PEPCID) 20 MG tablet Take 20 mg as needed by mouth for heartburn or indigestion.  . fluticasone (FLONASE) 50 MCG/ACT nasal spray Place 1 spray into both nostrils as needed.  Marland Kitchen levothyroxine (SYNTHROID, LEVOTHROID) 100 MCG tablet Take 1 tablet daily by mouth.  . metoprolol tartrate (LOPRESSOR) 50 MG tablet Take 1 tablet (50 mg total) by mouth 2 (two) times daily.  . nitroGLYCERIN (NITROSTAT) 0.4 MG SL tablet Place 1  tablet (0.4 mg total) under the tongue every 5 (five) minutes as needed for chest pain.  Marland Kitchen ondansetron (ZOFRAN-ODT) 4 MG disintegrating tablet Take 1 tablet by mouth as needed.  . pravastatin (PRAVACHOL) 40 MG tablet Take 1 tablet (40 mg total) by mouth every evening.  . triamcinolone ointment (KENALOG) 0.1 % Apply 1 application topically as directed.  . valsartan (DIOVAN) 160 MG tablet Take 1 tablet (160 mg total) by mouth daily.  . Vitamin D, Ergocalciferol, (DRISDOL) 1.25 MG (50000 UT) CAPS capsule Take 50,000 Units by mouth every 7 (seven) days.     Allergies:   Bee venom, Penciclovir, Shellfish-derived products, Sulfa antibiotics, Brilinta [ticagrelor], Prasugrel, Iodinated diagnostic agents, Penicillins, and Ramipril   Social History   Socioeconomic History  . Marital status: Married    Spouse name: Not on file  . Number of children: Not on file  . Years of education: Not on file  . Highest education level: Not on file  Occupational History  . Not on file  Tobacco Use  . Smoking status: Former Games developer  . Smokeless tobacco: Never Used  Vaping Use  . Vaping Use: Never used  Substance and Sexual Activity  . Alcohol use: No  . Drug use: No  . Sexual activity: Not on file  Other Topics Concern  . Not on file  Social History Narrative  . Not on file   Social Determinants of Health   Financial Resource Strain:   . Difficulty of Paying Living Expenses: Not on file  Food Insecurity:   . Worried About Programme researcher, broadcasting/film/video in the Last Year: Not on file  . Ran Out of Food in the Last Year: Not on file  Transportation Needs:   . Lack of Transportation (Medical): Not on file  . Lack of Transportation (Non-Medical): Not on file  Physical Activity:   . Days of Exercise per Week: Not on file  . Minutes of Exercise per Session: Not on file  Stress:   . Feeling of Stress : Not on file  Social Connections:   . Frequency of Communication with Friends and Family: Not on file  .  Frequency of Social Gatherings with Friends and Family: Not on file  . Attends Religious Services: Not on file  . Active Member of Clubs or Organizations: Not on file  . Attends Banker Meetings: Not on file  . Marital Status: Not on file     Family History: The patient's family history includes Arrhythmia in her sister; Congestive Heart Failure in her brother; Heart attack in her brother and father; Heart disease in her maternal uncle, mother, and paternal uncle; Rheum arthritis in her brother. ROS:   Please see the history of present illness.    All 14 point review of systems negative except as described per history of present illness  EKGs/Labs/Other Studies Reviewed:      Recent Labs: No results found for requested labs within last 8760  hours.  Recent Lipid Panel    Component Value Date/Time   CHOL 252 (H) 03/02/2019 0253   TRIG 48 03/02/2019 0253   HDL 56 03/02/2019 0253   CHOLHDL 4.5 03/02/2019 0253   VLDL 10 03/02/2019 0253   LDLCALC 186 (H) 03/02/2019 0253    Physical Exam:    VS:  BP (!) 160/84 (BP Location: Right Arm, Patient Position: Sitting)   Pulse 77   Ht 5\' 1"  (1.549 m)   Wt 179 lb (81.2 kg)   SpO2 97%   BMI 33.82 kg/m     Wt Readings from Last 3 Encounters:  07/25/20 179 lb (81.2 kg)  02/23/20 180 lb 12.8 oz (82 kg)  10/11/19 181 lb 9.6 oz (82.4 kg)     GEN:  Well nourished, well developed in no acute distress HEENT: Normal NECK: No JVD; No carotid bruits LYMPHATICS: No lymphadenopathy CARDIAC: RRR, no murmurs, no rubs, no gallops RESPIRATORY:  Clear to auscultation without rales, wheezing or rhonchi  ABDOMEN: Soft, non-tender, non-distended MUSCULOSKELETAL:  No edema; No deformity  SKIN: Warm and dry LOWER EXTREMITIES: no swelling NEUROLOGIC:  Alert and oriented x 3 PSYCHIATRIC:  Normal affect   ASSESSMENT:    1. Coronary artery disease involving native coronary artery of native heart with angina pectoris (HCC)   2.  Dyslipidemia   3. Atypical chest pain   4. Primary hypertension    PLAN:    In order of problems listed above:  1. Coronary disease stable from the that point review on appropriate medications.  She brought an issue of dual antiplatelet therapy and the recommended he in part to be on it.  Therefore she will take aspirin as well as Plavix.  But I also stressed importance of taking cholesterol medication.  She is taking pravastatin 40 mg daily and her LDL is 160.  It is dramatically high need to be better controlled however she does not want to do anything right now since she is recovering from Covid.  In the future we will discuss the issue of PCSK9 agent. 2. Dyslipidemia: Plan as outlined above, not well controlled yet. 3. Atypical chest pain denies having any. 4. Essential hypertension blood pressure elevated today but she is very stressed out today and asked her to check her blood pressure on the regular basis at home.  Still many risk factors are not properly modified.  She is very reluctant to consider new medication and changing some medications.  I will continue this discussion with her during next visit in 3 months.   Medication Adjustments/Labs and Tests Ordered: Current medicines are reviewed at length with the patient today.  Concerns regarding medicines are outlined above.  No orders of the defined types were placed in this encounter.  Medication changes: No orders of the defined types were placed in this encounter.   Signed, 10/13/19, MD, [facility] 07/25/2020 2:54 PM    [facility]

## 2020-10-02 DIAGNOSIS — R739 Hyperglycemia, unspecified: Secondary | ICD-10-CM | POA: Diagnosis not present

## 2020-10-02 DIAGNOSIS — I10 Essential (primary) hypertension: Secondary | ICD-10-CM | POA: Diagnosis not present

## 2020-10-02 DIAGNOSIS — F419 Anxiety disorder, unspecified: Secondary | ICD-10-CM | POA: Diagnosis not present

## 2020-10-02 DIAGNOSIS — R7989 Other specified abnormal findings of blood chemistry: Secondary | ICD-10-CM | POA: Diagnosis not present

## 2020-10-02 DIAGNOSIS — Z6832 Body mass index (BMI) 32.0-32.9, adult: Secondary | ICD-10-CM | POA: Diagnosis not present

## 2020-10-02 DIAGNOSIS — E538 Deficiency of other specified B group vitamins: Secondary | ICD-10-CM | POA: Diagnosis not present

## 2020-10-02 DIAGNOSIS — E785 Hyperlipidemia, unspecified: Secondary | ICD-10-CM | POA: Diagnosis not present

## 2020-10-02 DIAGNOSIS — E063 Autoimmune thyroiditis: Secondary | ICD-10-CM | POA: Diagnosis not present

## 2020-10-02 DIAGNOSIS — Z79899 Other long term (current) drug therapy: Secondary | ICD-10-CM | POA: Diagnosis not present

## 2020-10-15 DIAGNOSIS — I10 Essential (primary) hypertension: Secondary | ICD-10-CM | POA: Insufficient documentation

## 2020-10-15 DIAGNOSIS — E079 Disorder of thyroid, unspecified: Secondary | ICD-10-CM | POA: Insufficient documentation

## 2020-10-24 ENCOUNTER — Ambulatory Visit: Payer: Medicare HMO | Admitting: Cardiology

## 2020-10-26 DIAGNOSIS — M25562 Pain in left knee: Secondary | ICD-10-CM | POA: Diagnosis not present

## 2020-10-26 DIAGNOSIS — G8929 Other chronic pain: Secondary | ICD-10-CM | POA: Diagnosis not present

## 2020-10-26 DIAGNOSIS — M7122 Synovial cyst of popliteal space [Baker], left knee: Secondary | ICD-10-CM | POA: Diagnosis not present

## 2020-10-26 DIAGNOSIS — M1712 Unilateral primary osteoarthritis, left knee: Secondary | ICD-10-CM | POA: Diagnosis not present

## 2020-10-30 ENCOUNTER — Other Ambulatory Visit: Payer: Self-pay

## 2020-10-30 ENCOUNTER — Ambulatory Visit (INDEPENDENT_AMBULATORY_CARE_PROVIDER_SITE_OTHER): Payer: Medicare HMO

## 2020-10-30 ENCOUNTER — Encounter: Payer: Self-pay | Admitting: Cardiology

## 2020-10-30 ENCOUNTER — Ambulatory Visit: Payer: Medicare HMO | Admitting: Cardiology

## 2020-10-30 VITALS — BP 152/76 | HR 75 | Ht 61.0 in | Wt 189.0 lb

## 2020-10-30 DIAGNOSIS — I25119 Atherosclerotic heart disease of native coronary artery with unspecified angina pectoris: Secondary | ICD-10-CM

## 2020-10-30 DIAGNOSIS — I10 Essential (primary) hypertension: Secondary | ICD-10-CM | POA: Diagnosis not present

## 2020-10-30 DIAGNOSIS — R002 Palpitations: Secondary | ICD-10-CM

## 2020-10-30 DIAGNOSIS — E785 Hyperlipidemia, unspecified: Secondary | ICD-10-CM

## 2020-10-30 NOTE — Progress Notes (Signed)
Cardiology Office Note:    Date:  10/30/2020   ID:  GHAZAL PEVEY, DOB [birth year], MRN [id number]  PCP:  Gordy Councilman, FNP  Cardiologist:  Gypsy Balsam, MD    Referring MD: Gordy Councilman, FNP   Chief Complaint  Patient presents with  . Follow-up  Palpitations  History of Present Illness:    Kaitlyn Jimenez is a 70 y.o. female with past medical history significant for coronary artery disease, she did have a PTCA and stenting done in 2007, 2013 and recently stenting of the right coronary artery done in December 2020.  She has been put appropriately for dual antiplatelet therapy, she did have some difficulty taking Brilinta eventually she ended up being on Plavix which I will continue.  She comes today to my office because she complained of having some skipped beats.  No dizziness no passing out no sustained arrhythmias.  No swelling of lower extremities.  Denies have any chest pain tightness squeezing pressure burning chest.  Past Medical History:  Diagnosis Date  . Atypical chest pain 01/07/2019  . Coronary artery disease involving native coronary artery of native heart with angina pectoris (HCC) 07/13/2017   Stenting in 2007, 2013 to LAD, last cardiac cath in 02/2019 w/ prox RCA stent and 50% ISR of LAD stent  . Dyslipidemia 07/13/2017  . Essential hypertension 07/13/2017  . Gastroesophageal reflux disease without esophagitis 05/25/2020  . Hypertension   . MI (myocardial infarction) (HCC)   . MI (myocardial infarction) (HCC)   . Other hyperlipidemia 05/25/2020  . Other specified hypothyroidism 05/25/2020  . Primary hypertension 05/25/2020  . Thyroid disease   . Unstable angina (HCC) 03/01/2019    Past Surgical History:  Procedure Laterality Date  . ABDOMINAL HYSTERECTOMY    . CARDIAC CATHETERIZATION    . CHOLECYSTECTOMY    . CORONARY ANGIOPLASTY WITH STENT PLACEMENT    . LEFT HEART CATH AND CORONARY ANGIOGRAPHY N/A 03/02/2019   Procedure: LEFT HEART CATH AND CORONARY  ANGIOGRAPHY;  Surgeon: Tonny Bollman, MD;  Location: [facility] INVASIVE CV LAB;  Service: Cardiovascular;  Laterality: N/A;    Current Medications: Current Meds  Medication Sig  . ALPRAZolam (XANAX) 1 MG tablet Take 1 mg 2 (two) times daily as needed by mouth for anxiety.  Marland Kitchen aspirin EC 81 MG EC tablet Take 1 tablet (81 mg total) by mouth daily.  . Cyanocobalamin (VITAMIN B-12 IJ) Inject 1 Dose as directed every 30 (thirty) days.  Marland Kitchen EPINEPHrine 0.3 mg/0.3 mL IJ SOAJ injection Inject 0.3 mg once into the muscle.  . famotidine (PEPCID) 20 MG tablet Take 20 mg as needed by mouth for heartburn or indigestion.  . fluticasone (FLONASE) 50 MCG/ACT nasal spray Place 1 spray into both nostrils as needed for allergies.  Marland Kitchen levothyroxine (SYNTHROID, LEVOTHROID) 100 MCG tablet Take 1 tablet daily by mouth.  . metoprolol tartrate (LOPRESSOR) 50 MG tablet Take 1 tablet (50 mg total) by mouth 2 (two) times daily.  . nitroGLYCERIN (NITROSTAT) 0.4 MG SL tablet Place 1 tablet (0.4 mg total) under the tongue every 5 (five) minutes as needed for chest pain.  . pravastatin (PRAVACHOL) 40 MG tablet Take 1 tablet (40 mg total) by mouth every evening.  . triamcinolone ointment (KENALOG) 0.1 % Apply 1 application topically as needed (demititis).  . valsartan (DIOVAN) 160 MG tablet Take 1 tablet (160 mg total) by mouth daily.     Allergies:   Bee venom, Penciclovir, Shellfish-derived products, Sulfa antibiotics, Brilinta [ticagrelor], Prasugrel, Iodinated diagnostic  agents, Penicillins, and Ramipril   Social History   Socioeconomic History  . Marital status: Married    Spouse name: Not on file  . Number of children: Not on file  . Years of education: Not on file  . Highest education level: Not on file  Occupational History  . Not on file  Tobacco Use  . Smoking status: Former Games developer  . Smokeless tobacco: Never Used  Vaping Use  . Vaping Use: Never used  Substance and Sexual Activity  . Alcohol use: No  . Drug  use: No  . Sexual activity: Not on file  Other Topics Concern  . Not on file  Social History Narrative  . Not on file   Social Determinants of Health   Financial Resource Strain: Not on file  Food Insecurity: Not on file  Transportation Needs: Not on file  Physical Activity: Not on file  Stress: Not on file  Social Connections: Not on file     Family History: The patient's family history includes Arrhythmia in her sister; Congestive Heart Failure in her brother; Heart attack in her brother and father; Heart disease in her maternal uncle, mother, and paternal uncle; Rheum arthritis in her brother. ROS:   Please see the history of present illness.    All 14 point review of systems negative except as described per history of present illness  EKGs/Labs/Other Studies Reviewed:      Recent Labs: No results found for requested labs within last 8760 hours.  Recent Lipid Panel    Component Value Date/Time   CHOL 252 (H) 03/02/2019 0253   TRIG 48 03/02/2019 0253   HDL 56 03/02/2019 0253   CHOLHDL 4.5 03/02/2019 0253   VLDL 10 03/02/2019 0253   LDLCALC 186 (H) 03/02/2019 0253    Physical Exam:    VS:  BP (!) 152/76 (BP Location: Left Arm, Patient Position: Sitting)   Pulse 75   Ht 5\' 1"  (1.549 m)   Wt 189 lb (85.7 kg)   SpO2 96%   BMI 35.71 kg/m     Wt Readings from Last 3 Encounters:  10/30/20 189 lb (85.7 kg)  07/25/20 179 lb (81.2 kg)  02/23/20 180 lb 12.8 oz (82 kg)     GEN:  Well nourished, well developed in no acute distress HEENT: Normal NECK: No JVD; No carotid bruits LYMPHATICS: No lymphadenopathy CARDIAC: RRR, no murmurs, no rubs, no gallops RESPIRATORY:  Clear to auscultation without rales, wheezing or rhonchi  ABDOMEN: Soft, non-tender, non-distended MUSCULOSKELETAL:  No edema; No deformity  SKIN: Warm and dry LOWER EXTREMITIES: no swelling NEUROLOGIC:  Alert and oriented x 3 PSYCHIATRIC:  Normal affect   ASSESSMENT:    1. Coronary artery  disease involving native coronary artery of native heart with angina pectoris (HCC)   2. Primary hypertension   3. Dyslipidemia   4. Palpitations    PLAN:    In order of problems listed above:  1. Coronary disease stable from that point reviewing her situation with multiple risk factors that are not modified I will continue indefinitely. 2. Essential hypertension somewhat elevated today but she does not want to change any of her medications. 3. Dyslipidemia absolutely unacceptable.  With her LDL of 174 she is taking only pravastatin I had again long discussion with her I wanted her to be on PCSK9 agent, she did see Dr. 04/25/20 our lipid neurologist she was recommended to start PCSK9 agent however she cannot afford it I told her about newer agent  we have but she is not willing to explore it she said she be fine.  She does not want to take any medications make her feel worse.  I did talk about consequences of elevated cholesterol and repeated myocardial infarction however she still does not want to do it.  I think she is very much aware of all risks that she is taking. 4. Palpitations I will check her magnesium potassium today, I will put a monitor on her for a week to see what palpitations she is experiencing.  EKG today shows left bundle branch block with APCs   Medication Adjustments/Labs and Tests Ordered: Current medicines are reviewed at length with the patient today.  Concerns regarding medicines are outlined above.  No orders of the defined types were placed in this encounter.  Medication changes: No orders of the defined types were placed in this encounter.   Signed, Georgeanna Lea, MD, [facility] 10/30/2020 11:44 AM     [facility]

## 2020-10-30 NOTE — Patient Instructions (Signed)
Medication Instructions:  Your physician recommends that you continue on your current medications as directed. Please refer to the Current Medication list given to you today.  *If you need a refill on your cardiac medications before your next appointment, please call your pharmacy*   Lab Work: Your physician recommends that you return for lab work today: bmp, mg   If you have labs (blood work) drawn today and your tests are completely normal, you will receive your results only by: Marland Kitchen MyChart Message (if you have MyChart) OR . A paper copy in the mail If you have any lab test that is abnormal or we need to change your treatment, we will call you to review the results.   Testing/Procedures: A zio monitor was ordered today. It will remain on for 7 days. You will then return monitor and event diary in provided box. It takes 1-2 weeks for report to be downloaded and returned to [place]. We will call you with the results. If monitor falls off or has orange flashing light, please call Zio for further instructions.      Follow-Up: At [facility], you and your health needs are our priority.  As part of our continuing mission to provide you with exceptional heart care, we have created designated Provider Care Teams.  These Care Teams include your primary Cardiologist (physician) and Advanced Practice Providers (APPs -  Physician Assistants and Nurse Practitioners) who all work together to provide you with the care you need, when you need it.  We recommend signing up for the patient portal called "MyChart".  Sign up information is provided on this After Visit Summary.  MyChart is used to connect with patients for Virtual Visits (Telemedicine).  Patients are able to view lab/test results, encounter notes, upcoming appointments, etc.  Non-urgent messages can be sent to your provider as well.   To learn more about what you can do with MyChart, go to [email or web address].    Your next appointment:   3  month(s)  The format for your next appointment:   In Person  Provider:   Gypsy Balsam, MD   Other Instructions

## 2020-10-31 LAB — BASIC METABOLIC PANEL
BUN/Creatinine Ratio: 10 — ABNORMAL LOW (ref 12–28)
BUN: 9 mg/dL (ref 8–27)
CO2: 21 mmol/L (ref 20–29)
Calcium: 9.2 mg/dL (ref 8.7–10.3)
Chloride: 100 mmol/L (ref 96–106)
Creatinine, Ser: 0.91 mg/dL (ref 0.57–1.00)
Glucose: 109 mg/dL — ABNORMAL HIGH (ref 65–99)
Potassium: 4.3 mmol/L (ref 3.5–5.2)
Sodium: 134 mmol/L (ref 134–144)
eGFR: 68 mL/min/{1.73_m2} (ref >59)

## 2020-10-31 LAB — MAGNESIUM: Magnesium: 2.1 mg/dL (ref 1.6–2.3)

## 2020-11-01 ENCOUNTER — Telehealth: Payer: Self-pay

## 2020-11-01 NOTE — Telephone Encounter (Signed)
-----   Message from Georgeanna Lea, MD sent at 10/31/2020  8:44 PM EST ----- All electrolytes are normal, continue present management

## 2020-11-01 NOTE — Telephone Encounter (Signed)
Patient notified of results.

## 2020-11-09 DIAGNOSIS — M1712 Unilateral primary osteoarthritis, left knee: Secondary | ICD-10-CM | POA: Diagnosis not present

## 2020-11-09 NOTE — Progress Notes (Signed)
 Large Joint Injection: L knee  Date/Time: 11/09/2020 2:11 PM Performed by: Sharrie Barter, DO Authorized by: Sharrie Barter, DO   Needle Size:  22 G Guidance: ultrasound   Location:  Knee Site:  L knee Topical skin anesthesia: obtained using ethyl chloride spray   Medications:  4 mL bupivacaine  HCl 0.25 %; 4 mL lidocaine  1 %; 6 mg betamethasone  acetate-betamethasone  sodium phosphate  6 mg/mL

## 2020-11-14 DIAGNOSIS — I25119 Atherosclerotic heart disease of native coronary artery with unspecified angina pectoris: Secondary | ICD-10-CM | POA: Diagnosis not present

## 2020-11-14 DIAGNOSIS — E785 Hyperlipidemia, unspecified: Secondary | ICD-10-CM | POA: Diagnosis not present

## 2020-11-14 DIAGNOSIS — R002 Palpitations: Secondary | ICD-10-CM | POA: Diagnosis not present

## 2020-11-14 DIAGNOSIS — I10 Essential (primary) hypertension: Secondary | ICD-10-CM | POA: Diagnosis not present

## 2020-11-23 ENCOUNTER — Telehealth: Payer: Self-pay | Admitting: Emergency Medicine

## 2020-11-23 DIAGNOSIS — I4891 Unspecified atrial fibrillation: Secondary | ICD-10-CM

## 2020-11-23 NOTE — Telephone Encounter (Signed)
Called patient informed her of results. She will come for labs next week.

## 2020-11-23 NOTE — Telephone Encounter (Signed)
-----   Message from Kaitlyn Lea, MD sent at 11/23/2020 12:20 PM EDT ----- Episode of atrial fibrillation on the monitor, please ask her to have CBC, Chem-7 and stool for guaiac before we start anticoagulation

## 2020-11-26 DIAGNOSIS — I4891 Unspecified atrial fibrillation: Secondary | ICD-10-CM | POA: Diagnosis not present

## 2020-11-26 LAB — CBC
Hematocrit: 38.7 % (ref 34.0–46.6)
Hemoglobin: 12.9 g/dL (ref 11.1–15.9)
MCH: 29 pg (ref 26.6–33.0)
MCHC: 33.3 g/dL (ref 31.5–35.7)
MCV: 87 fL (ref 79–97)
Platelets: 357 10*3/uL (ref 150–450)
RBC: 4.45 x10E6/uL (ref 3.77–5.28)
RDW: 11.8 % (ref 11.7–15.4)
WBC: 6.6 10*3/uL (ref 3.4–10.8)

## 2020-11-26 LAB — BASIC METABOLIC PANEL
BUN/Creatinine Ratio: 14 (ref 12–28)
BUN: 13 mg/dL (ref 8–27)
CO2: 20 mmol/L (ref 20–29)
Calcium: 9.6 mg/dL (ref 8.7–10.3)
Chloride: 100 mmol/L (ref 96–106)
Creatinine, Ser: 0.93 mg/dL (ref 0.57–1.00)
Glucose: 112 mg/dL — ABNORMAL HIGH (ref 65–99)
Potassium: 4.5 mmol/L (ref 3.5–5.2)
Sodium: 134 mmol/L (ref 134–144)
eGFR: 66 mL/min/{1.73_m2} (ref >59)

## 2020-11-27 DIAGNOSIS — I4891 Unspecified atrial fibrillation: Secondary | ICD-10-CM | POA: Diagnosis not present

## 2020-11-28 ENCOUNTER — Telehealth: Payer: Self-pay | Admitting: Cardiology

## 2020-11-28 NOTE — Telephone Encounter (Signed)
Patient is returning call to discuss lab results. 

## 2020-11-28 NOTE — Telephone Encounter (Signed)
Patient informed of lab results. She said Dr. Bing Matter was talking about starting a new medication for atrial fibrillation and that is why he did labs. I advised her he didn't mention starting anything new in his result note but I will check with him.

## 2020-11-29 LAB — FECAL OCCULT BLOOD, IMMUNOCHEMICAL: Fecal Occult Bld: NEGATIVE

## 2020-11-30 ENCOUNTER — Telehealth: Payer: Self-pay | Admitting: Emergency Medicine

## 2020-11-30 MED ORDER — APIXABAN 5 MG PO TABS: 5.0000 mg | ORAL_TABLET | Freq: Two times a day (BID) | ORAL | 3 refills | Status: DC

## 2020-11-30 NOTE — Telephone Encounter (Signed)
See additional encounter

## 2020-11-30 NOTE — Telephone Encounter (Signed)
-----   Message from Georgeanna Lea, MD sent at 11/29/2020 10:00 PM EDT ----- Theda Sers, all labs are looking good.  Stop aspirin start Eliquis 5 mg twice daily

## 2020-11-30 NOTE — Telephone Encounter (Signed)
Not except the idea was to check electrolytes make sure electrolytes are fine I will make sure electrolytes abnormality are not responsible for her palpitations.  And this is not the case.  So there is no need to alter anything

## 2020-11-30 NOTE — Telephone Encounter (Signed)
Called patient informed her of results. She will stop aspirin and start eliquis 5 mg twice daily.

## 2020-12-17 ENCOUNTER — Telehealth: Payer: Self-pay | Admitting: Cardiology

## 2020-12-17 NOTE — Telephone Encounter (Signed)
Patient c/o Palpitations:  High priority if patient c/o lightheadedness, shortness of breath, or chest pain  1) How long have you had palpitations/irregular HR/ Afib? Are you having the symptoms now?No symptoms now, has been occurring for 2 weeks.   2) Are you currently experiencing lightheadedness, SOB or CP? No, had CP last night   3) Do you have a history of afib (atrial fibrillation) or irregular heart rhythm? Yes afib  4) Have you checked your BP or HR? (document readings if available): Yes has been good   5) Are you experiencing any other symptoms? CP last night, also pain in left arm as well   Bad Afib attack last night, was hurting in left arm. Had to take aspirin to avoid going to the hospital. Wants to get checked out incase of another blockage. Appt has been scheduled, requesting work in for sooner    Pt c/o medication issue:  1. Name of Medication: apixaban (ELIQUIS) 5 MG TABS tablet  2. How are you currently taking this medication (dosage and times per day)? Last taken on 11/13/20  3. Are you having a reaction (difficulty breathing--STAT)? Yes   4. What is your medication issue? States it made her hallucinate and feel crazy in the head. Stopped taking and started back on aspirin.    Pt c/o of Chest Pain: STAT if CP now or developed within 24 hours  1. Are you having CP right now? No   2. Are you experiencing any other symptoms (ex. SOB, nausea, vomiting, sweating)? Left arm pain   3. How long have you been experiencing CP? Occurred this morning around 2:30 AM   4. Is your CP continuous or coming and going? Continuous until heart went back into rhythm   5. Have you taken Nitroglycerin? No  ?

## 2020-12-17 NOTE — Telephone Encounter (Signed)
Pt states that she was in a fib with a rapid rate at 0200. Pt states that she had lt arm pain and nausea. Pt states that she has not been taking her Eliquis because it made her feel funny. Pt states that she is going to attempt to take it again to see if she has the same sx. Pt reports that after her heart rate slowed down the lt arm pain resolved. Pt states that her BP 158/70 HR 78 this am and 135/70 HR 70 this pm.   I discussed the importance of the Eliquis and if she cannot tolerate the medication we need to know so that we can change it. Pt verbalized understanding and had no additional questions

## 2020-12-31 DIAGNOSIS — E538 Deficiency of other specified B group vitamins: Secondary | ICD-10-CM | POA: Diagnosis not present

## 2020-12-31 DIAGNOSIS — R7989 Other specified abnormal findings of blood chemistry: Secondary | ICD-10-CM | POA: Diagnosis not present

## 2020-12-31 DIAGNOSIS — I10 Essential (primary) hypertension: Secondary | ICD-10-CM | POA: Diagnosis not present

## 2020-12-31 DIAGNOSIS — R739 Hyperglycemia, unspecified: Secondary | ICD-10-CM | POA: Diagnosis not present

## 2020-12-31 DIAGNOSIS — E785 Hyperlipidemia, unspecified: Secondary | ICD-10-CM | POA: Diagnosis not present

## 2020-12-31 DIAGNOSIS — F419 Anxiety disorder, unspecified: Secondary | ICD-10-CM | POA: Diagnosis not present

## 2020-12-31 DIAGNOSIS — Z6833 Body mass index (BMI) 33.0-33.9, adult: Secondary | ICD-10-CM | POA: Diagnosis not present

## 2020-12-31 DIAGNOSIS — I2510 Atherosclerotic heart disease of native coronary artery without angina pectoris: Secondary | ICD-10-CM | POA: Diagnosis not present

## 2020-12-31 DIAGNOSIS — E063 Autoimmune thyroiditis: Secondary | ICD-10-CM | POA: Diagnosis not present

## 2021-01-17 ENCOUNTER — Ambulatory Visit: Payer: Medicare HMO | Admitting: Cardiology

## 2021-01-31 ENCOUNTER — Ambulatory Visit: Payer: Medicare HMO | Admitting: Cardiology

## 2021-04-01 ENCOUNTER — Encounter: Payer: Self-pay | Admitting: Cardiology

## 2021-04-01 ENCOUNTER — Other Ambulatory Visit: Payer: Self-pay

## 2021-04-01 ENCOUNTER — Ambulatory Visit: Payer: Medicare HMO | Admitting: Cardiology

## 2021-04-01 VITALS — BP 170/84 | HR 82 | Ht 61.0 in | Wt 186.0 lb

## 2021-04-01 DIAGNOSIS — R002 Palpitations: Secondary | ICD-10-CM

## 2021-04-01 DIAGNOSIS — I10 Essential (primary) hypertension: Secondary | ICD-10-CM | POA: Diagnosis not present

## 2021-04-01 DIAGNOSIS — Z7689 Persons encountering health services in other specified circumstances: Secondary | ICD-10-CM

## 2021-04-01 DIAGNOSIS — E785 Hyperlipidemia, unspecified: Secondary | ICD-10-CM

## 2021-04-01 DIAGNOSIS — I25119 Atherosclerotic heart disease of native coronary artery with unspecified angina pectoris: Secondary | ICD-10-CM

## 2021-04-01 DIAGNOSIS — I4891 Unspecified atrial fibrillation: Secondary | ICD-10-CM

## 2021-04-01 DIAGNOSIS — R0789 Other chest pain: Secondary | ICD-10-CM

## 2021-04-01 MED ORDER — RIVAROXABAN 20 MG PO TABS: 20.0000 mg | ORAL_TABLET | Freq: Every day | ORAL | 3 refills | Status: DC

## 2021-04-01 NOTE — Patient Instructions (Signed)
Medication Instructions:  Your physician has recommended you make the following change in your medication: STOP: Eliquis STOP: Plavix  START: Xarelto 20 mg once daily *If you need a refill on your cardiac medications before your next appointment, please call your pharmacy*   Lab Work: None If you have labs (blood work) drawn today and your tests are completely normal, you will receive your results only by: MyChart Message (if you have MyChart) OR A paper copy in the mail If you have any lab test that is abnormal or we need to change your treatment, we will call you to review the results.   Testing/Procedures: Your physician has requested that you have a lexiscan myoview. For further information please visit [email or web address] Please follow instruction sheet, as given.   The test will take approximately 3 to 4 hours to complete; you may bring reading material.  If someone comes with you to your appointment, they will need to remain in the main lobby due to limited space in the testing area.  How to prepare for your Myocardial Perfusion Test: Do not eat or drink 3 hours prior to your test, except you may have water. Do not consume products containing caffeine (regular or decaffeinated) 12 hours prior to your test. (ex: coffee, chocolate, sodas, tea). Do bring a list of your current medications with you.  If not listed below, you may take your medications as normal. Do wear comfortable clothes (no dresses or overalls) and walking shoes, tennis shoes preferred (No heels or open toe shoes are allowed). Do NOT wear cologne, perfume, aftershave, or lotions (deodorant is allowed). If these instructions are not followed, your test will have to be rescheduled.     Follow-Up: At [facility], you and your health needs are our priority.  As part of our continuing mission to provide you with exceptional heart care, we have created designated Provider Care Teams.  These Care Teams include  your primary Cardiologist (physician) and Advanced Practice Providers (APPs -  Physician Assistants and Nurse Practitioners) who all work together to provide you with the care you need, when you need it.  We recommend signing up for the patient portal called "MyChart".  Sign up information is provided on this After Visit Summary.  MyChart is used to connect with patients for Virtual Visits (Telemedicine).  Patients are able to view lab/test results, encounter notes, upcoming appointments, etc.  Non-urgent messages can be sent to your provider as well.   To learn more about what you can do with MyChart, go to [email or web address].    Your next appointment:   5 month(s)  The format for your next appointment:   In Person  Provider:   Gypsy Balsam, MD   Other Instructions

## 2021-04-01 NOTE — Progress Notes (Signed)
Cardiology Office Note:    Date:  04/01/2021   ID:  Kaitlyn Jimenez, DOB 01-20-1951, MRN [id number]  PCP:  Kaitlyn Councilman, FNP  Cardiologist:  Gypsy Balsam, MD    Referring MD: Kaitlyn Councilman, FNP   Chief Complaint  Patient presents with   Palpitations    History of Present Illness:    Kaitlyn Jimenez is a 70 y.o. female   with past medical history significant for coronary artery disease, she did have a PTCA and stenting done in 2007, 2013 and recently stenting of the right coronary artery done in December 2020.  She has been put appropriately for dual antiplatelet therapy, she did have some difficulty taking Brilinta eventually she ended up being on Plavix Recently she started complaining of having palpitations, she wore a monitor which showed evidence of atrial fibrillation.  Total burden was less than 1% rate was between 70 and 157 with an average rate of 101, longest episode 4 minutes 6 seconds.  Since that time she has been put on anticoagulation.  She comes today to my office for follow-up.  She complained of having palpitations.  It bothers her a lot.  Also complained of having some chest pain interestingly last time when she got PTCA and stenting of the right coronary artery she did not have any chest pain.  I think we reached the point that more advanced therapy for atrial fibrillation as needed.  I talked to her about potentially starting antiarrhythmic she is reluctant.  She also want to stop her Eliquis actually she did already stop Eliquis about a month ago I told her it is not appropriate.  I will try to put her on Xarelto 20 mg daily.  And she will be referred to EP team for consideration of atrial fibrillation ablation or antiarrhythmic therapy  Past Medical History:  Diagnosis Date   Atypical chest pain 01/07/2019   Coronary artery disease involving native coronary artery of native heart with angina pectoris (HCC) 07/13/2017   Stenting in 2007, 2013 to LAD, last  cardiac cath in 02/2019 w/ prox RCA stent and 50% ISR of LAD stent   Dyslipidemia 07/13/2017   Essential hypertension 07/13/2017   Gastroesophageal reflux disease without esophagitis 05/25/2020   Hypertension    MI (myocardial infarction) (HCC)    MI (myocardial infarction) (HCC)    Other hyperlipidemia 05/25/2020   Other specified hypothyroidism 05/25/2020   Primary hypertension 05/25/2020   Thyroid disease    Unstable angina (HCC) 03/01/2019    Past Surgical History:  Procedure Laterality Date   ABDOMINAL HYSTERECTOMY     CARDIAC CATHETERIZATION     CHOLECYSTECTOMY     CORONARY ANGIOPLASTY WITH STENT PLACEMENT     LEFT HEART CATH AND CORONARY ANGIOGRAPHY N/A 03/02/2019   Procedure: LEFT HEART CATH AND CORONARY ANGIOGRAPHY;  Surgeon: Tonny Bollman, MD;  Location: [facility] INVASIVE CV LAB;  Service: Cardiovascular;  Laterality: N/A;    Current Medications: Current Meds  Medication Sig   ALPRAZolam (XANAX) 1 MG tablet Take 1 mg 2 (two) times daily as needed by mouth for anxiety.   apixaban (ELIQUIS) 5 MG TABS tablet Take 1 tablet (5 mg total) by mouth 2 (two) times daily.   Cyanocobalamin (VITAMIN B-12 IJ) Inject 1 Dose as directed every 30 (thirty) days.   EPINEPHrine 0.3 mg/0.3 mL IJ SOAJ injection Inject 0.3 mg once into the muscle.   famotidine (PEPCID) 20 MG tablet Take 20 mg as needed by mouth for heartburn or  indigestion.   fluticasone (FLONASE) 50 MCG/ACT nasal spray Place 1 spray into both nostrils as needed for allergies.   levothyroxine (SYNTHROID, LEVOTHROID) 100 MCG tablet Take 1 tablet daily by mouth.   metoprolol tartrate (LOPRESSOR) 50 MG tablet Take 1 tablet (50 mg total) by mouth 2 (two) times daily.   nitroGLYCERIN (NITROSTAT) 0.4 MG SL tablet Place 1 tablet (0.4 mg total) under the tongue every 5 (five) minutes as needed for chest pain.   pravastatin (PRAVACHOL) 40 MG tablet Take 1 tablet (40 mg total) by mouth every evening.   triamcinolone ointment (KENALOG) 0.1 % Apply 1  application topically as needed (demititis).   valsartan (DIOVAN) 160 MG tablet Take 1 tablet (160 mg total) by mouth daily.   Vitamin D, Ergocalciferol, (DRISDOL) 1.25 MG (50000 UT) CAPS capsule Take 50,000 Units by mouth every 7 (seven) days.     Allergies:   Bee venom, Penciclovir, Shellfish-derived products, Sulfa antibiotics, Brilinta [ticagrelor], Prasugrel, Iodinated diagnostic agents, Penicillins, and Ramipril   Social History   Socioeconomic History   Marital status: Married    Spouse name: Not on file   Number of children: Not on file   Years of education: Not on file   Highest education level: Not on file  Occupational History   Not on file  Tobacco Use   Smoking status: Former   Smokeless tobacco: Never  Vaping Use   Vaping Use: Never used  Substance and Sexual Activity   Alcohol use: No   Drug use: No   Sexual activity: Not on file  Other Topics Concern   Not on file  Social History Narrative   Not on file   Social Determinants of Health   Financial Resource Strain: Not on file  Food Insecurity: Not on file  Transportation Needs: Not on file  Physical Activity: Not on file  Stress: Not on file  Social Connections: Not on file     Family History: The patient's family history includes Arrhythmia in her sister; Congestive Heart Failure in her brother; Heart attack in her brother and father; Heart disease in her maternal uncle, mother, and paternal uncle; Rheum arthritis in her brother. ROS:   Please see the history of present illness.    All 14 point review of systems negative except as described per history of present illness  EKGs/Labs/Other Studies Reviewed:      Recent Labs: 10/30/2020: Magnesium 2.1 11/26/2020: BUN 13; Creatinine, Ser 0.93; Hemoglobin 12.9; Platelets 357; Potassium 4.5; Sodium 134  Recent Lipid Panel    Component Value Date/Time   CHOL 252 (H) 03/02/2019 0253   TRIG 48 03/02/2019 0253   HDL 56 03/02/2019 0253   CHOLHDL 4.5  03/02/2019 0253   VLDL 10 03/02/2019 0253   LDLCALC 186 (H) 03/02/2019 0253    Physical Exam:    VS:  BP (!) 170/84 (BP Location: Right Arm, Patient Position: Sitting)   Pulse 82   Ht 5\' 1"  (1.549 m)   Wt 186 lb (84.4 kg)   SpO2 98%   BMI 35.14 kg/m     Wt Readings from Last 3 Encounters:  04/01/21 186 lb (84.4 kg)  10/30/20 189 lb (85.7 kg)  07/25/20 179 lb (81.2 kg)     GEN:  Well nourished, well developed in no acute distress HEENT: Normal NECK: No JVD; No carotid bruits LYMPHATICS: No lymphadenopathy CARDIAC: RRR, no murmurs, no rubs, no gallops RESPIRATORY:  Clear to auscultation without rales, wheezing or rhonchi  ABDOMEN: Soft, non-tender, non-distended  MUSCULOSKELETAL:  No edema; No deformity  SKIN: Warm and dry LOWER EXTREMITIES: no swelling NEUROLOGIC:  Alert and oriented x 3 PSYCHIATRIC:  Normal affect   ASSESSMENT:    1. Coronary artery disease involving native coronary artery of native heart with angina pectoris (HCC)   2. Essential hypertension   3. Primary hypertension   4. Palpitations   5. Dyslipidemia    PLAN:    In order of problems listed above:  Coronary disease status post PTCA and stenting of the right coronary artery in 2020, prior stenting of LAD.  She is does have some symptoms which are atypical but need to be investigated.  Eugenie Birks will be performed Essential hypertension blood pressure seems to be elevated today like always in the office she said that at home usually good.  We will continue monitoring Palpitations to be referred to EP team for consideration of more advanced therapy for atrial fibrillation.  In the meantime we will get a stress test to assess visibility for antiarrhythmic therapy. Dyslipidemia: I did review her K PN from summer of this year showing LDL of 157 for, HDL 49.  She is on pravastatin 40.  We will recheck fasting lipid profile and augment therapy if she is able to tolerate.   Medication Adjustments/Labs and  Tests Ordered: Current medicines are reviewed at length with the patient today.  Concerns regarding medicines are outlined above.  No orders of the defined types were placed in this encounter.  Medication changes: No orders of the defined types were placed in this encounter.   Signed, Georgeanna Lea, MD, [facility] 04/01/2021 1:52 PM    [facility]

## 2021-04-01 NOTE — Addendum Note (Signed)
Addended by: Reynolds Bowl on: 04/01/2021 02:33 PM   Modules accepted: Orders

## 2021-04-02 DIAGNOSIS — Z1331 Encounter for screening for depression: Secondary | ICD-10-CM | POA: Diagnosis not present

## 2021-04-02 DIAGNOSIS — E785 Hyperlipidemia, unspecified: Secondary | ICD-10-CM | POA: Diagnosis not present

## 2021-04-02 DIAGNOSIS — Z139 Encounter for screening, unspecified: Secondary | ICD-10-CM | POA: Diagnosis not present

## 2021-04-02 DIAGNOSIS — R739 Hyperglycemia, unspecified: Secondary | ICD-10-CM | POA: Diagnosis not present

## 2021-04-02 DIAGNOSIS — R7989 Other specified abnormal findings of blood chemistry: Secondary | ICD-10-CM | POA: Diagnosis not present

## 2021-04-02 DIAGNOSIS — Z6832 Body mass index (BMI) 32.0-32.9, adult: Secondary | ICD-10-CM | POA: Diagnosis not present

## 2021-04-02 DIAGNOSIS — E538 Deficiency of other specified B group vitamins: Secondary | ICD-10-CM | POA: Diagnosis not present

## 2021-04-02 DIAGNOSIS — F419 Anxiety disorder, unspecified: Secondary | ICD-10-CM | POA: Diagnosis not present

## 2021-04-02 DIAGNOSIS — E063 Autoimmune thyroiditis: Secondary | ICD-10-CM | POA: Diagnosis not present

## 2021-04-02 DIAGNOSIS — Z9181 History of falling: Secondary | ICD-10-CM | POA: Diagnosis not present

## 2021-04-09 ENCOUNTER — Telehealth (HOSPITAL_COMMUNITY): Payer: Self-pay | Admitting: *Deleted

## 2021-04-09 ENCOUNTER — Encounter (HOSPITAL_COMMUNITY): Payer: Medicare HMO

## 2021-04-09 NOTE — Telephone Encounter (Signed)
Patient given detailed instructions per Myocardial Perfusion Study Information Sheet for the test on 04/15/21 at 8:15. Patient notified to arrive 15 minutes early and that it is imperative to arrive on time for appointment to keep from having the test rescheduled.  If you need to cancel or reschedule your appointment, please call the office within 24 hours of your appointment. . Patient verbalized understanding. Daneil Dolin

## 2021-04-10 IMAGING — DX PORTABLE CHEST - 1 VIEW
1 series · 1 of 1 positions shown · non-contrast
Comparison: 03/05/2017

CLINICAL DATA: Pt c/o chest pain, pressure and left arm pain since
this am, hx MI in 7764, HTN, cardiac cath. 3 stents

EXAM:
PORTABLE CHEST 1 VIEW

[chest ap]
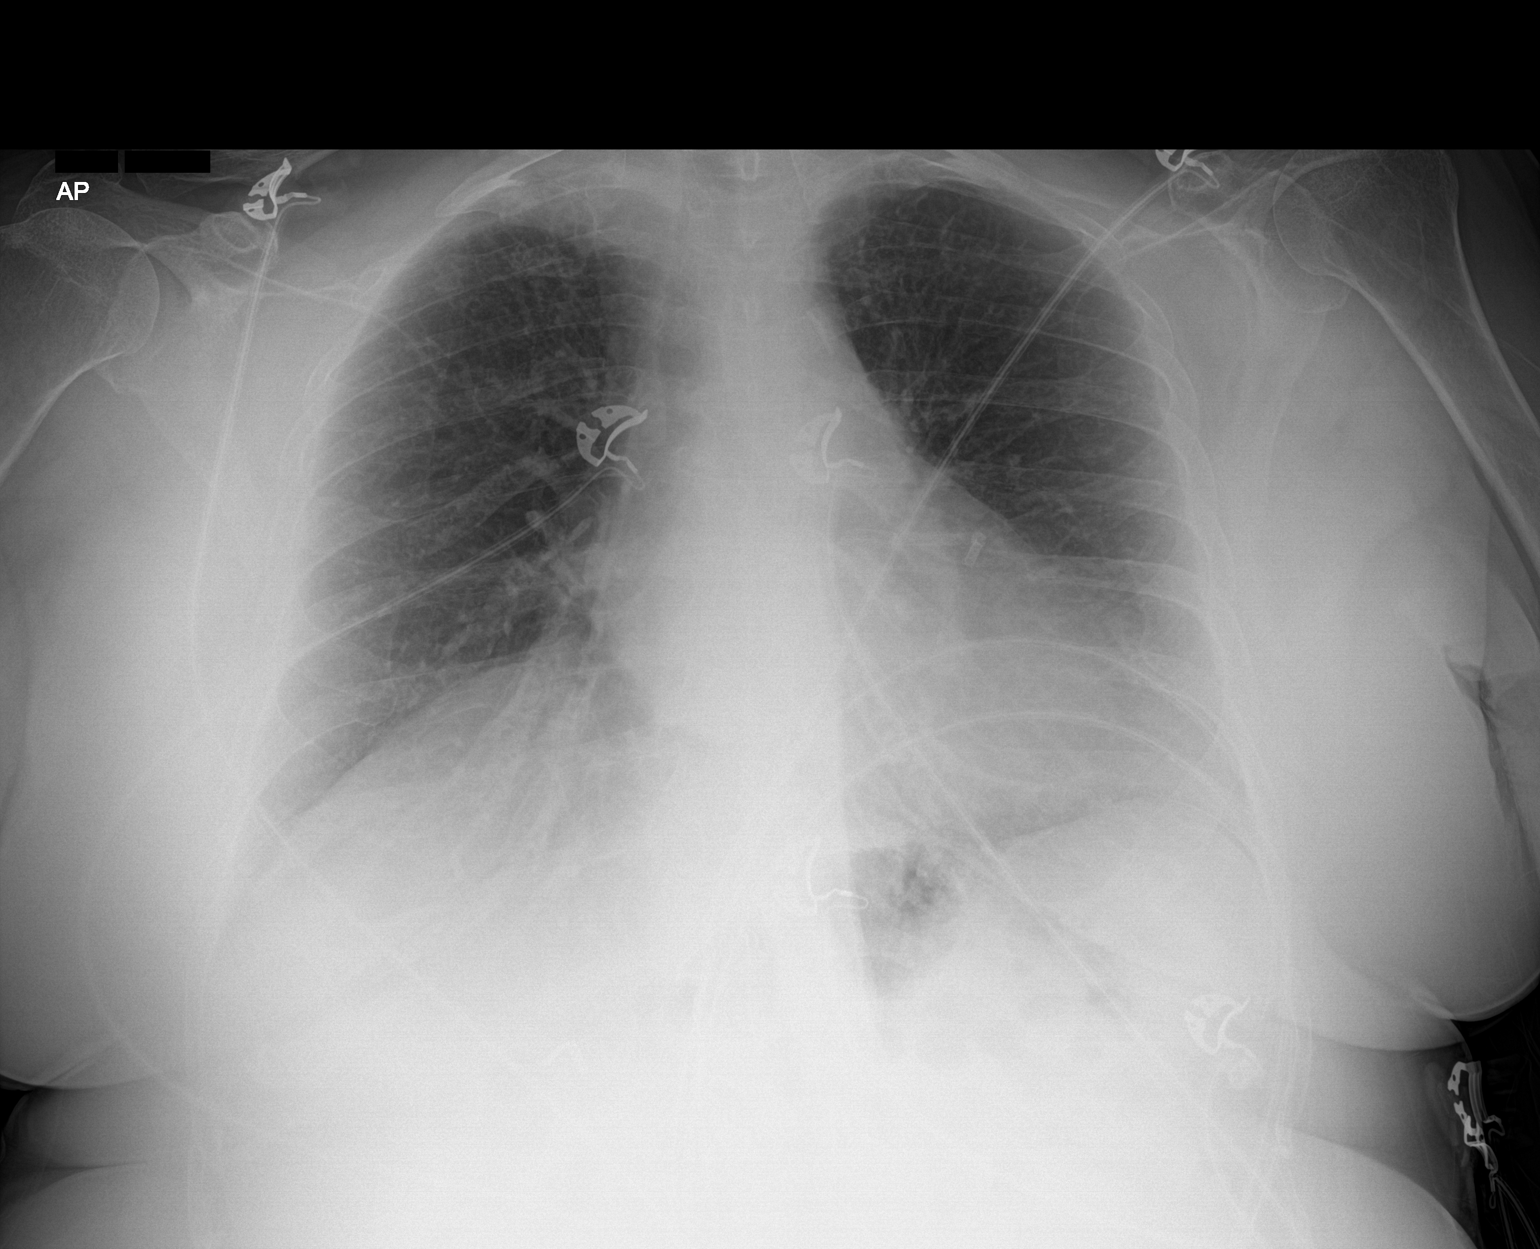

[1 of 1 positions shown; findings below may reference images not displayed]

FINDINGS: Cardiac silhouette is normal in size and configuration. No
mediastinal or hilar masses. No evidence of adenopathy.

Streaky opacities noted in the medial lung bases consistent with
chronic bronchitic change, stable. Lungs are otherwise clear. No
evidence of a pleural effusion. No pneumothorax.

Skeletal structures are grossly intact.
IMPRESSION: No active disease.

## 2021-04-11 NOTE — Addendum Note (Signed)
Addended by: Gypsy Balsam on: 04/11/2021 10:18 AM   Modules accepted: Orders

## 2021-04-15 ENCOUNTER — Ambulatory Visit (HOSPITAL_COMMUNITY): Payer: Medicare HMO | Attending: Cardiology

## 2021-04-15 ENCOUNTER — Other Ambulatory Visit: Payer: Self-pay

## 2021-04-15 DIAGNOSIS — R0789 Other chest pain: Secondary | ICD-10-CM | POA: Diagnosis not present

## 2021-04-15 LAB — MYOCARDIAL PERFUSION IMAGING
Base ST Depression (mm): 0 mm
LV dias vol: 59 mL (ref 46–106)
LV sys vol: 21 mL
MPHR: 150 {beats}/min
Nuc Stress EF: 63 %
Peak HR: 105 {beats}/min
Percent HR: 70 %
RATE: 63
Rest HR: 71 {beats}/min
Rest Nuclear Isotope Dose: 9.8 mCi
SDS: 3
SRS: 0
SSS: 3
ST Depression (mm): 0 mm
Stress Nuclear Isotope Dose: 30.4 mCi
TID: 0.86

## 2021-04-15 MED ORDER — TECHNETIUM TC 99M TETROFOSMIN IV KIT
9.8000 | PACK | Freq: Once | INTRAVENOUS | Status: AC | PRN
  Administered 2021-04-15: 9.8 via INTRAVENOUS
  Filled 2021-04-15: qty 10

## 2021-04-15 MED ORDER — REGADENOSON 0.4 MG/5ML IV SOLN
0.4000 mg | Freq: Once | INTRAVENOUS | Status: AC
  Administered 2021-04-15: 0.4 mg via INTRAVENOUS

## 2021-04-15 MED ORDER — TECHNETIUM TC 99M TETROFOSMIN IV KIT
30.4000 | PACK | Freq: Once | INTRAVENOUS | Status: AC | PRN
  Administered 2021-04-15: 30.4 via INTRAVENOUS
  Filled 2021-04-15: qty 31

## 2021-04-19 ENCOUNTER — Telehealth: Payer: Self-pay

## 2021-04-19 NOTE — Telephone Encounter (Signed)
Patient notified of results.

## 2021-04-19 NOTE — Telephone Encounter (Signed)
-----   Message from Georgeanna Lea, MD sent at 04/18/2021 10:07 AM EDT ----- Stress test showed no evidence of ischemia

## 2021-05-02 ENCOUNTER — Encounter: Payer: Self-pay | Admitting: Cardiology

## 2021-05-02 ENCOUNTER — Ambulatory Visit: Payer: Medicare HMO | Admitting: Cardiology

## 2021-05-02 ENCOUNTER — Other Ambulatory Visit: Payer: Self-pay

## 2021-05-02 VITALS — BP 164/64 | HR 72 | Ht 61.0 in | Wt 185.2 lb

## 2021-05-02 DIAGNOSIS — I480 Paroxysmal atrial fibrillation: Secondary | ICD-10-CM

## 2021-05-02 DIAGNOSIS — Z01818 Encounter for other preprocedural examination: Secondary | ICD-10-CM

## 2021-05-02 DIAGNOSIS — I4819 Other persistent atrial fibrillation: Secondary | ICD-10-CM | POA: Diagnosis not present

## 2021-05-02 DIAGNOSIS — Z01812 Encounter for preprocedural laboratory examination: Secondary | ICD-10-CM

## 2021-05-02 MED ORDER — APIXABAN 5 MG PO TABS: 5.0000 mg | ORAL_TABLET | Freq: Two times a day (BID) | ORAL | 6 refills | Status: DC

## 2021-05-02 MED ORDER — ASPIRIN EC 81 MG PO TBEC: 81.0000 mg | DELAYED_RELEASE_TABLET | Freq: Every day | ORAL | 3 refills | Status: AC

## 2021-05-02 MED ORDER — PREDNISONE 50 MG PO TABS: ORAL_TABLET | ORAL | 0 refills | Status: DC

## 2021-05-02 NOTE — Patient Instructions (Signed)
Medication Instructions:  Your physician has recommended you make the following change in your medication: DECREASE Aspirin to 81 mg daily STOP Xarelto START Eliquis 5 mg twice daily  *If you need a refill on your cardiac medications before your next appointment, please call your pharmacy*   Lab Work: Pre procedure labs in [place] office between 10/3 - 10/17:  BMP & CBC  If you have labs (blood work) drawn today and your tests are completely normal, you will receive your results only by: MyChart Message (if you have MyChart) OR A paper copy in the mail If you have any lab test that is abnormal or we need to change your treatment, we will call you to review the results.   Testing/Procedures: Your physician has requested that you have cardiac CT within 7 days PRIOR to your ablation. Cardiac computed tomography (CT) is a painless test that uses an x-ray machine to take clear, detailed pictures of your heart.  Please follow instruction below located under "other instructions". You will get a call from our office to schedule the date for this test.  Your physician has recommended that you have an ablation. Catheter ablation is a medical procedure used to treat some cardiac arrhythmias (irregular heartbeats). During catheter ablation, a long, thin, flexible tube is put into a blood vessel in your groin (upper thigh), or neck. This tube is called an ablation catheter. It is then guided to your heart through the blood vessel. Radio frequency waves destroy small areas of heart tissue where abnormal heartbeats may cause an arrhythmia to start. Please follow instruction below located under "other instructions".   Follow-Up: At [facility], you and your health needs are our priority.  As part of our continuing mission to provide you with exceptional heart care, we have created designated Provider Care Teams.  These Care Teams include your primary Cardiologist (physician) and Advanced Practice  Providers (APPs -  Physician Assistants and Nurse Practitioners) who all work together to provide you with the care you need, when you need it.  We recommend signing up for the patient portal called "MyChart".  Sign up information is provided on this After Visit Summary.  MyChart is used to connect with patients for Virtual Visits (Telemedicine).  Patients are able to view lab/test results, encounter notes, upcoming appointments, etc.  Non-urgent messages can be sent to your provider as well.   To learn more about what you can do with MyChart, go to [email or web address].    Your next appointment:   1 month(s) after your ablation  The format for your next appointment:   In Person  Provider:   AFib clinic   Thank you for choosing [facility]!!   Dory Horn, RN [phone]    Other Instructions   CT INSTRUCTIONS Your cardiac CT will be scheduled at:  [facility] [address] [place], [place] [address] [phone]  Please arrive at the [facility] main entrance (entrance A) of [facility] 30 minutes prior to test start time. Proceed to the [facility] Radiology Department (first floor) to check-in and test prep.   Please follow these instructions carefully (unless otherwise directed):  On the Night Before the Test: Be sure to Drink plenty of water. Do not consume any caffeinated/decaffeinated beverages or chocolate 12 hours prior to your test. Do not take any antihistamines 12 hours prior to your test. If the patient has contrast allergy: Patient will need a prescription for Prednisone and very clear instructions (  as follows): Prednisone 50 mg - take 13 hours prior to test Take another Prednisone 50 mg 7 hours prior to test Take another Prednisone 50 mg 1 hour prior to test Take Benadryl 50 mg 1 hour prior to test Patient must complete all four doses of above prophylactic medications. Patient will need a ride after test due to  Benadryl.  On the Day of the Test: Drink plenty of water until 1 hour prior to the test. Do not eat any food 4 hours prior to the test. You may take your regular medications prior to the test.  Take metoprolol (Lopressor) 100 mg two hours prior to test. HOLD Furosemide/Hydrochlorothiazide morning of the test. FEMALES- please wear underwire-free bra if available       After the Test: Drink plenty of water. After receiving IV contrast, you may experience a mild flushed feeling. This is normal. On occasion, you may experience a mild rash up to 24 hours after the test. This is not dangerous. If this occurs, you can take Benadryl 25 mg and increase your fluid intake. If you experience trouble breathing, this can be serious. If it is severe call 911 IMMEDIATELY. If it is mild, please call our office. If you take any of these medications: Glipizide/Metformin, Avandament, Glucavance, please do not take 48 hours after completing test unless otherwise instructed.   Once we have confirmed authorization from your insurance company, we will call you to set up a date and time for your test. Based on how quickly your insurance processes prior authorizations requests, please allow up to 4 weeks to be contacted for scheduling your Cardiac CT appointment. Be advised that routine Cardiac CT appointments could be scheduled as many as 8 weeks after your provider has ordered it.  For non-scheduling related questions, please contact the cardiac imaging nurse navigator should you have any questions/concerns: [name], Cardiac Imaging Nurse Navigator Larey Brick, Cardiac Imaging Nurse Navigator [facility] Direct Office Dial: [phone]   For scheduling needs, including cancellations and rescheduling, please call [place], [phone].     Electrophysiology/Ablation Procedure Instructions   You are scheduled for a(n)  ablation on 06/21/2021 with Dr. Loman Brooklyn.   1.    Pre procedure testing-             A.  LAB WORK --- Between 10/3 - 10/17 for your pre procedure blood work.  You do NOT need to be fasting. You can stop by the [facility] office   On the day of your procedure 06/21/2021 you will go to [facility] [phone]) at 5:30 am.  Bonita Quin will go to the main entrance A [employer]) and enter where the [employer] are.  Your driver will drop you off and you will head down the hallway to ADMITTING.  You may have one support person come in to the hospital with you.  They will be asked to wait in the waiting room. It is OK to have someone drop you off and come back when you are ready to be discharged.   3.   Do not eat or drink after midnight prior to your procedure.   4.   On the morning of your procedure do NOT take any medication. Do not miss any doses of your blood thinner prior to the morning of your procedure or your procedure will need to be rescheduled.   5.  Plan for an overnight stay but you may be discharged  after your procedure, if you use your phone frequently bring your phone charger. If you are discharged after your procedure you will need someone to drive you home and be with you for 24 hours after your procedure.   6. You will follow up with the AFIB clinic 4 weeks after your procedure.  You will follow up with Dr. Elberta Fortis  3 months after your procedure.  These appointments will be made for you.   7. FYI: For your safety, and to allow [place] to monitor your vital signs accurately during the surgery/procedure we request that if you have artificial nails, gel coating, SNS etc. Please have those removed prior to your surgery/procedure. Not having the nail coverings /polish removed may result in cancellation or delay of your surgery/procedure.  * If you have ANY questions please call the office [phone] and ask for Pacey Altizer RN or send me a MyChart message   * Occasionally, EP Studies and ablations can become lengthy.  Please  make your family aware of this before your procedure starts.  Average time ranges from 2-8 hours for EP studies/ablations.  Your physician will call your family after the procedure with the results.                                   Cardiac Ablation Cardiac ablation is a procedure to destroy, or ablate, a small amount of heart tissue in very specific places. The heart has many electrical connections. Sometimes these connections are abnormal and can cause the heart to beat very fast or irregularly. Ablating some of the areas that cause problems can improve the heart's rhythm or return it to normal. Ablation may be done for people who: Have Wolff-Parkinson-White syndrome. Have fast heart rhythms (tachycardia). Have taken medicines for an abnormal heart rhythm (arrhythmia) that were not effective or caused side effects. Have a high-risk heartbeat that may be life-threatening. During the procedure, a small incision is made in the neck or the groin, and a long, thin tube (catheter) is inserted into the incision and moved to the heart. Small devices (electrodes) on the tip of the catheter will send out electrical currents. A type of X-ray (fluoroscopy) will be used to help guide the catheter and to provide images of the heart. Tell a health care provider about: Any allergies you have. All medicines you are taking, including vitamins, herbs, eye drops, creams, and over-the-counter medicines. Any problems you or family members have had with anesthetic medicines. Any blood disorders you have. Any surgeries you have had. Any medical conditions you have, such as kidney failure. Whether you are pregnant or may be pregnant. What are the risks? Generally, this is a safe procedure. However, problems may occur, including: Infection. Bruising and bleeding at the catheter insertion site. Bleeding into the chest, especially into the sac that surrounds the heart. This is a serious complication. Stroke or blood  clots. Damage to nearby structures or organs. Allergic reaction to medicines or dyes. Need for a permanent pacemaker if the normal electrical system is damaged. A pacemaker is a small computer that sends electrical signals to the heart and helps your heart beat normally. The procedure not being fully effective. This may not be recognized until months later. Repeat ablation procedures are sometimes done. What happens before the procedure? Medicines Ask your health care provider about: Changing or stopping your regular medicines. This is especially important if you are taking diabetes  medicines or blood thinners. Taking medicines such as aspirin and ibuprofen. These medicines can thin your blood. Do not take these medicines unless your health care provider tells you to take them. Taking over-the-counter medicines, vitamins, herbs, and supplements. General instructions Follow instructions from your health care provider about eating or drinking restrictions. Plan to have someone take you home from the hospital or clinic. If you will be going home right after the procedure, plan to have someone with you for 24 hours. Ask your health care provider what steps will be taken to prevent infection. What happens during the procedure?  An IV will be inserted into one of your veins. You will be given a medicine to help you relax (sedative). The skin on your neck or groin will be numbed. An incision will be made in your neck or your groin. A needle will be inserted through the incision and into a large vein in your neck or groin. A catheter will be inserted into the needle and moved to your heart. Dye may be injected through the catheter to help your surgeon see the area of the heart that needs treatment. Electrical currents will be sent from the catheter to ablate heart tissue in desired areas. There are three types of energy that may be used to do this: Heat (radiofrequency energy). Laser  energy. Extreme cold (cryoablation). When the tissue has been ablated, the catheter will be removed. Pressure will be held on the insertion area to prevent a lot of bleeding. A bandage (dressing) will be placed over the insertion area. The exact procedure may vary among health care providers and hospitals. What happens after the procedure? Your blood pressure, heart rate, breathing rate, and blood oxygen level will be monitored until you leave the hospital or clinic. Your insertion area will be monitored for bleeding. You will need to lie still for a few hours to ensure that you do not bleed from the insertion area. Do not drive for 24 hours or as long as told by your health care provider. Summary Cardiac ablation is a procedure to destroy, or ablate, a small amount of heart tissue using an electrical current. This procedure can improve the heart rhythm or return it to normal. Tell your health care provider about any medical conditions you may have and all medicines you are taking to treat them. This is a safe procedure, but problems may occur. Problems may include infection, bruising, damage to nearby organs or structures, or allergic reactions to medicines. Follow your health care provider's instructions about eating and drinking before the procedure. You may also be told to change or stop some of your medicines. After the procedure, do not drive for 24 hours or as long as told by your health care provider. This information is not intended to replace advice given to you by your health care provider. Make sure you discuss any questions you have with your health care provider. Document Revised: 06/20/2019 Document Reviewed: 06/20/2019 Elsevier Patient Education  2022 [employer].

## 2021-05-02 NOTE — Addendum Note (Signed)
Addended by: Baird Lyons on: 05/02/2021 11:00 AM   Modules accepted: Orders

## 2021-05-02 NOTE — Progress Notes (Signed)
Electrophysiology Office Note   Date:  05/02/2021   ID:  Kaitlyn Jimenez, DOB 11/08/1950, MRN [id number]  PCP:  No primary care provider on file.  Cardiologist:  Bing Matter Primary Electrophysiologist:  Donn Zanetti Jorja Loa, MD    Chief Complaint: AF   History of Present Illness: Kaitlyn Jimenez is a 70 y.o. female who is being seen today for the evaluation of AF at the request of Georgeanna Lea, MD. Presenting today for electrophysiology evaluation.  She has a history significant for coronary artery disease status post stenting in 2007, 2013, and recent stenting of the RCA in 2020.  She presented to her cardiologist office complaining of palpitations.  She wore a cardiac monitor that showed evidence of atrial fibrillation with a burden of less than 1%.  Her longest episode was 4 minutes and 6 seconds.  She was started on anticoagulation.  Today, she denies symptoms of palpitations, chest pain, shortness of breath, orthopnea, PND, lower extremity edema, claudication, dizziness, presyncope, syncope, bleeding, or neurologic sequela. The patient is tolerating medications without difficulties.  She feels well today.  She does have intermittent episodes of atrial fibrillation though that make her feel weak and fatigued.  She has been changing her diet, drinking less caffeine which may have improved her symptoms.  Aside from that, she has quite a few stressors in her life.  Her husband has been sick and she has had multiple issues since being diagnosed with COVID 15 months ago.   Past Medical History:  Diagnosis Date   Atypical chest pain 01/07/2019   Coronary artery disease involving native coronary artery of native heart with angina pectoris (HCC) 07/13/2017   Stenting in 2007, 2013 to LAD, last cardiac cath in 02/2019 w/ prox RCA stent and 50% ISR of LAD stent   Dyslipidemia 07/13/2017   Essential hypertension 07/13/2017   Gastroesophageal reflux disease without esophagitis 05/25/2020    Hypertension    MI (myocardial infarction) (HCC)    MI (myocardial infarction) (HCC)    Other hyperlipidemia 05/25/2020   Other specified hypothyroidism 05/25/2020   Primary hypertension 05/25/2020   Thyroid disease    Unstable angina (HCC) 03/01/2019   Past Surgical History:  Procedure Laterality Date   ABDOMINAL HYSTERECTOMY     CARDIAC CATHETERIZATION     CHOLECYSTECTOMY     CORONARY ANGIOPLASTY WITH STENT PLACEMENT     LEFT HEART CATH AND CORONARY ANGIOGRAPHY N/A 03/02/2019   Procedure: LEFT HEART CATH AND CORONARY ANGIOGRAPHY;  Surgeon: Tonny Bollman, MD;  Location: [facility] INVASIVE CV LAB;  Service: Cardiovascular;  Laterality: N/A;     Current Outpatient Medications  Medication Sig Dispense Refill   ALPRAZolam (XANAX) 1 MG tablet Take 1 mg 2 (two) times daily as needed by mouth for anxiety.     apixaban (ELIQUIS) 5 MG TABS tablet Take 1 tablet (5 mg total) by mouth 2 (two) times daily. 60 tablet 6   aspirin EC 81 MG tablet Take 1 tablet (81 mg total) by mouth daily. Swallow whole. 90 tablet 3   Cyanocobalamin (VITAMIN B-12 IJ) Inject 1 Dose as directed every 30 (thirty) days.     EPINEPHrine 0.3 mg/0.3 mL IJ SOAJ injection Inject 0.3 mg once into the muscle.     famotidine (PEPCID) 20 MG tablet Take 20 mg as needed by mouth for heartburn or indigestion.     fluticasone (FLONASE) 50 MCG/ACT nasal spray Place 1 spray into both nostrils as needed for allergies.     levothyroxine (SYNTHROID)  88 MCG tablet Take 1 tablet daily by mouth.     metoprolol tartrate (LOPRESSOR) 50 MG tablet Take 1 tablet (50 mg total) by mouth 2 (two) times daily. 60 tablet 3   nitroGLYCERIN (NITROSTAT) 0.4 MG SL tablet Place 1 tablet (0.4 mg total) under the tongue every 5 (five) minutes as needed for chest pain. 25 tablet 2   predniSONE (DELTASONE) 50 MG tablet Take 1 tablet (50 mg total) 13 hours prior, 7 hours prior and 1 hour prior to CT testing 3 tablet 0   triamcinolone ointment (KENALOG) 0.1 % Apply 1  application topically as needed (demititis).     valsartan (DIOVAN) 160 MG tablet Take 1 tablet (160 mg total) by mouth daily. 90 tablet 1   Vitamin D, Ergocalciferol, (DRISDOL) 1.25 MG (50000 UT) CAPS capsule Take 50,000 Units by mouth every 7 (seven) days.     pravastatin (PRAVACHOL) 40 MG tablet Take 1 tablet (40 mg total) by mouth every evening. 90 tablet 1   No current facility-administered medications for this visit.    Allergies:   Bee venom, Penciclovir, Shellfish-derived products, Sulfa antibiotics, Brilinta [ticagrelor], Prasugrel, Iodinated diagnostic agents, Penicillins, and Ramipril   Social History:  The patient  reports that she has quit smoking. She has never used smokeless tobacco. She reports that she does not drink alcohol and does not use drugs.   Family History:  The patient's family history includes Arrhythmia in her sister; Congestive Heart Failure in her brother; Heart attack in her brother and father; Heart disease in her maternal uncle, mother, and paternal uncle; Rheum arthritis in her brother.    ROS:  Please see the history of present illness.   Otherwise, review of systems is positive for none.   All other systems are reviewed and negative.    PHYSICAL EXAM: VS:  BP (!) 164/64   Pulse 72   Ht 5\' 1"  (1.549 m)   Wt 185 lb 3.2 oz (84 kg)   SpO2 99%   BMI 34.99 kg/m  , BMI Body mass index is 34.99 kg/m. GEN: Well nourished, well developed, in no acute distress  HEENT: normal  Neck: no JVD, carotid bruits, or masses Cardiac: RRR; no murmurs, rubs, or gallops,no edema  Respiratory:  clear to auscultation bilaterally, normal work of breathing GI: soft, nontender, nondistended, + BS MS: no deformity or atrophy  Skin: warm and dry Neuro:  Strength and sensation are intact Psych: euthymic mood, full affect  EKG:  EKG is ordered today. Personal review of the ekg ordered shows sinus rhythm, left bundle branch block, rate 72  Recent Labs: 10/30/2020: Magnesium  2.1 11/26/2020: BUN 13; Creatinine, Ser 0.93; Hemoglobin 12.9; Platelets 357; Potassium 4.5; Sodium 134    Lipid Panel     Component Value Date/Time   CHOL 252 (H) 03/02/2019 0253   TRIG 48 03/02/2019 0253   HDL 56 03/02/2019 0253   CHOLHDL 4.5 03/02/2019 0253   VLDL 10 03/02/2019 0253   LDLCALC 186 (H) 03/02/2019 0253     Wt Readings from Last 3 Encounters:  05/02/21 185 lb 3.2 oz (84 kg)  04/15/21 186 lb (84.4 kg)  04/01/21 186 lb (84.4 kg)      Other studies Reviewed: Additional studies/ records that were reviewed today include: Myoview 04/15/2021   Review of the above records today demonstrates:    Findings are consistent with prior myocardial infarction in LAD distribution. The study is overall low risk.   No ST deviation was noted.  LV perfusion is abnormal. There is no evidence of inducible ischemia. There is evidence of infarction.   Defect 1: There is a medium defect with mild reduction in uptake present in the apical to mid anterior and apex location(s) that is fixed. There is abnormal wall motion in the anteroapex. Consistent with infarction.   Nuclear stress EF: 63 %. The left ventricular ejection fraction is normal (55-65%). Left ventricular function is normal. End diastolic cavity size is normal.   Cardiac monitor 11/23/2020 personally reviewed  Episode of atrial fibrillation noted total burden less than 1% with heart rate between 70 and 157 with average of 101.  Longest episode 4 minutes 6 seconds.  TTE 03/02/2019  1. The left ventricle has normal systolic function with an ejection  fraction of 60-65%. The cavity size was normal. Left ventricular diastolic  function could not be evaluated due to nondiagnostic images. No evidence  of left ventricular regional wall  motion abnormalities.   2. The right ventricle has normal systolic function. The cavity was  normal. There is no increase in right ventricular wall thickness.   3. The aortic valve is tricuspid. Mild  sclerosis of the aortic valve.  Aortic valve regurgitation was not assessed by color flow Doppler.  ASSESSMENT AND PLAN:  1.  Paroxysmal atrial fibrillation: CHA2DS2-VASc of 3.  Currently on Xarelto 20 mg daily, metoprolol 50 mg twice daily.  She has not been good about taking her Xarelto.  She would like to switch back to Eliquis.  We Taleeya Blondin start Eliquis 5 mg twice daily today.  She would like to avoid antiarrhythmics and would prefer ablation.  Risk, benefits, and alternatives to EP study and radiofrequency ablation for afib were also discussed in detail today. These risks include but are not limited to stroke, bleeding, vascular damage, tamponade, perforation, damage to the esophagus, lungs, and other structures, pulmonary vein stenosis, worsening renal function, and death. The patient understands these risk and wishes to proceed.  We Mckyla Deckman therefore proceed with catheter ablation at the next available time.  Carto, ICE, anesthesia are requested for the procedure.  Jai Steil also obtain CT PV protocol prior to the procedure to exclude LAA thrombus and further evaluate atrial anatomy.   2.  Hypertension: Elevated today.  Has been elevated in the past as well.  Plan per primary cardiology.  3.  Coronary artery disease: Status post multiple stents, most recently in 2020.  Myoview without evidence of ischemia.  Case discussed with primary cardiology  Current medicines are reviewed at length with the patient today.   The patient does not have concerns regarding her medicines.  The following changes were made today:  none  Labs/ tests ordered today include:  Orders Placed This Encounter  Procedures   Basic metabolic panel   CBC   EKG 12-Lead     Disposition:   FU with Hanson Medeiros 3 months  Signed, Reynoldo Mainer Jorja Loa, MD  05/02/2021 10:16 AM     [facility] [address] [place] [address] [phone] (office) [phone] (fax)

## 2021-05-15 DIAGNOSIS — E063 Autoimmune thyroiditis: Secondary | ICD-10-CM | POA: Diagnosis not present

## 2021-06-06 DIAGNOSIS — Z01812 Encounter for preprocedural laboratory examination: Secondary | ICD-10-CM | POA: Diagnosis not present

## 2021-06-06 DIAGNOSIS — I480 Paroxysmal atrial fibrillation: Secondary | ICD-10-CM | POA: Diagnosis not present

## 2021-06-06 LAB — CBC
Hematocrit: 39.2 % (ref 34.0–46.6)
Hemoglobin: 13 g/dL (ref 11.1–15.9)
MCH: 29.1 pg (ref 26.6–33.0)
MCHC: 33.2 g/dL (ref 31.5–35.7)
MCV: 88 fL (ref 79–97)
Platelets: 307 10*3/uL (ref 150–450)
RBC: 4.46 x10E6/uL (ref 3.77–5.28)
RDW: 12 % (ref 11.7–15.4)
WBC: 7.5 10*3/uL (ref 3.4–10.8)

## 2021-06-06 LAB — BASIC METABOLIC PANEL
BUN/Creatinine Ratio: 12 (ref 12–28)
BUN: 11 mg/dL (ref 8–27)
CO2: 21 mmol/L (ref 20–29)
Calcium: 9.7 mg/dL (ref 8.7–10.3)
Chloride: 99 mmol/L (ref 96–106)
Creatinine, Ser: 0.94 mg/dL (ref 0.57–1.00)
Glucose: 96 mg/dL (ref 70–99)
Potassium: 4.9 mmol/L (ref 3.5–5.2)
Sodium: 134 mmol/L (ref 134–144)
eGFR: 65 mL/min/{1.73_m2} (ref >59)

## 2021-06-13 ENCOUNTER — Encounter (HOSPITAL_COMMUNITY): Payer: Self-pay | Admitting: Emergency Medicine

## 2021-06-13 ENCOUNTER — Telehealth (HOSPITAL_COMMUNITY): Payer: Self-pay | Admitting: Emergency Medicine

## 2021-06-13 NOTE — Telephone Encounter (Signed)
Reaching out to patient to offer assistance regarding upcoming cardiac imaging study [name] RN Navigator Cardiac Imaging [name] [phone] office [phone] cell  Pt reports breakthru reaction after taking 13 hr prep prior to contrast admin. Discussed this with CT and Radiologist who advises against CTA. Phone call to [name] nurse, sherri, who will inform camnitz and discuss plan moving forward.   Informed patient to expect phone call from sherri regarding plan for ablation next week.   Pt appreciated call, time, and compassion to do whats right for her.   Kaitlyn Jimenez

## 2021-06-17 ENCOUNTER — Ambulatory Visit (HOSPITAL_COMMUNITY): Payer: Medicare HMO

## 2021-06-18 ENCOUNTER — Telehealth: Payer: Self-pay | Admitting: Cardiology

## 2021-06-18 NOTE — Telephone Encounter (Signed)
Pt is scheduled for TEE on table prior to ablation.  Advised Pt.  All questions answered.

## 2021-06-18 NOTE — Telephone Encounter (Signed)
  Pt is requesting to speak with Sherri, she is following up when she can get her EKG scheduled

## 2021-06-20 NOTE — Pre-Procedure Instructions (Signed)
Instructed patient on the following items: Arrival time 0530 Nothing to eat or drink after midnight No meds AM of procedure Responsible person to drive you home and stay with you for 24 hrs Wash with special soap night before and morning of procedure If on anti-coagulant drug instructions Eliquis- hasn't missed any doses 

## 2021-06-21 ENCOUNTER — Ambulatory Visit (HOSPITAL_COMMUNITY)
Admission: RE | Admit: 2021-06-21 | Discharge: 2021-06-21 | Disposition: A | Discharge status: Home / Self Care | Payer: Medicare HMO | Attending: Cardiology | Admitting: Cardiology

## 2021-06-21 ENCOUNTER — Ambulatory Visit (HOSPITAL_COMMUNITY): Payer: Medicare HMO | Admitting: Anesthesiology

## 2021-06-21 ENCOUNTER — Encounter (HOSPITAL_COMMUNITY): Payer: Self-pay | Admitting: Cardiology

## 2021-06-21 ENCOUNTER — Encounter (HOSPITAL_COMMUNITY)
Admission: RE | Disposition: A | Discharge status: Home / Self Care | Payer: Self-pay | Source: Home / Self Care | Attending: Cardiology

## 2021-06-21 ENCOUNTER — Other Ambulatory Visit: Payer: Self-pay

## 2021-06-21 ENCOUNTER — Ambulatory Visit (HOSPITAL_COMMUNITY): Payer: Medicare HMO

## 2021-06-21 DIAGNOSIS — Z91030 Bee allergy status: Secondary | ICD-10-CM | POA: Diagnosis not present

## 2021-06-21 DIAGNOSIS — Z8249 Family history of ischemic heart disease and other diseases of the circulatory system: Secondary | ICD-10-CM | POA: Insufficient documentation

## 2021-06-21 DIAGNOSIS — I252 Old myocardial infarction: Secondary | ICD-10-CM | POA: Insufficient documentation

## 2021-06-21 DIAGNOSIS — I25110 Atherosclerotic heart disease of native coronary artery with unstable angina pectoris: Secondary | ICD-10-CM | POA: Diagnosis not present

## 2021-06-21 DIAGNOSIS — I4891 Unspecified atrial fibrillation: Secondary | ICD-10-CM | POA: Diagnosis not present

## 2021-06-21 DIAGNOSIS — Z880 Allergy status to penicillin: Secondary | ICD-10-CM | POA: Diagnosis not present

## 2021-06-21 DIAGNOSIS — Z882 Allergy status to sulfonamides status: Secondary | ICD-10-CM | POA: Diagnosis not present

## 2021-06-21 DIAGNOSIS — I25119 Atherosclerotic heart disease of native coronary artery with unspecified angina pectoris: Secondary | ICD-10-CM | POA: Diagnosis not present

## 2021-06-21 DIAGNOSIS — I480 Paroxysmal atrial fibrillation: Secondary | ICD-10-CM

## 2021-06-21 DIAGNOSIS — Z91041 Radiographic dye allergy status: Secondary | ICD-10-CM | POA: Insufficient documentation

## 2021-06-21 DIAGNOSIS — I10 Essential (primary) hypertension: Secondary | ICD-10-CM | POA: Diagnosis not present

## 2021-06-21 DIAGNOSIS — Z87891 Personal history of nicotine dependence: Secondary | ICD-10-CM | POA: Insufficient documentation

## 2021-06-21 DIAGNOSIS — E7849 Other hyperlipidemia: Secondary | ICD-10-CM | POA: Diagnosis not present

## 2021-06-21 DIAGNOSIS — Z955 Presence of coronary angioplasty implant and graft: Secondary | ICD-10-CM | POA: Diagnosis not present

## 2021-06-21 HISTORY — PX: ATRIAL FIBRILLATION ABLATION: EP1191

## 2021-06-21 LAB — GLUCOSE, CAPILLARY: Glucose-Capillary: 169 mg/dL — ABNORMAL HIGH (ref 70–99)

## 2021-06-21 LAB — POCT ACTIVATED CLOTTING TIME
Activated Clotting Time: 364 seconds
Activated Clotting Time: 306 seconds

## 2021-06-21 SURGERY — ATRIAL FIBRILLATION ABLATION
Anesthesia: General

## 2021-06-21 MED ORDER — SODIUM CHLORIDE 0.9% FLUSH: 3.0000 mL | INTRAVENOUS | Status: DC | PRN

## 2021-06-21 MED ORDER — PHENYLEPHRINE HCL (PRESSORS) 10 MG/ML IV SOLN
INTRAVENOUS | Status: DC | PRN
  Administered 2021-06-21 (×4): 80 ug via INTRAVENOUS
  Administered 2021-06-21: 40 ug via INTRAVENOUS
  Administered 2021-06-21: 80 ug via INTRAVENOUS

## 2021-06-21 MED ORDER — HEPARIN SODIUM (PORCINE) 1000 UNIT/ML IJ SOLN
INTRAMUSCULAR | Status: AC
  Filled 2021-06-21: qty 1

## 2021-06-21 MED ORDER — HEPARIN (PORCINE) IN NACL 1000-0.9 UT/500ML-% IV SOLN
INTRAVENOUS | Status: AC
  Filled 2021-06-21: qty 2000

## 2021-06-21 MED ORDER — HEPARIN (PORCINE) IN NACL 1000-0.9 UT/500ML-% IV SOLN
INTRAVENOUS | Status: DC | PRN
  Administered 2021-06-21 (×4): 500 mL

## 2021-06-21 MED ORDER — ONDANSETRON HCL 4 MG/2ML IJ SOLN
INTRAMUSCULAR | Status: DC | PRN
  Administered 2021-06-21: 4 mg via INTRAVENOUS

## 2021-06-21 MED ORDER — PROTAMINE SULFATE 10 MG/ML IV SOLN
INTRAVENOUS | Status: DC | PRN
Start: 2021-06-21 — End: 2021-06-21
  Administered 2021-06-21: 40 mg via INTRAVENOUS

## 2021-06-21 MED ORDER — ONDANSETRON HCL 4 MG/2ML IJ SOLN
4.0000 mg | Freq: Four times a day (QID) | INTRAMUSCULAR | Status: DC | PRN
  Administered 2021-06-21: 4 mg via INTRAVENOUS

## 2021-06-21 MED ORDER — HEPARIN SODIUM (PORCINE) 1000 UNIT/ML IJ SOLN
INTRAMUSCULAR | Status: DC | PRN
  Administered 2021-06-21: 1000 [IU] via INTRAVENOUS

## 2021-06-21 MED ORDER — SUGAMMADEX SODIUM 200 MG/2ML IV SOLN
INTRAVENOUS | Status: DC | PRN
  Administered 2021-06-21: 200 mg via INTRAVENOUS

## 2021-06-21 MED ORDER — PROPOFOL 10 MG/ML IV BOLUS
INTRAVENOUS | Status: DC | PRN
  Administered 2021-06-21: 150 mg via INTRAVENOUS

## 2021-06-21 MED ORDER — SODIUM CHLORIDE 0.9% FLUSH: 3.0000 mL | Freq: Two times a day (BID) | INTRAVENOUS | Status: DC

## 2021-06-21 MED ORDER — DEXAMETHASONE SODIUM PHOSPHATE 10 MG/ML IJ SOLN
INTRAMUSCULAR | Status: DC | PRN
  Administered 2021-06-21: 10 mg via INTRAVENOUS

## 2021-06-21 MED ORDER — PHENYLEPHRINE HCL-NACL 20-0.9 MG/250ML-% IV SOLN
INTRAVENOUS | Status: DC | PRN
  Administered 2021-06-21: 20 ug/min via INTRAVENOUS

## 2021-06-21 MED ORDER — SODIUM CHLORIDE 0.9 % IV SOLN: 250.0000 mL | INTRAVENOUS | Status: DC | PRN

## 2021-06-21 MED ORDER — ROCURONIUM BROMIDE 10 MG/ML (PF) SYRINGE
PREFILLED_SYRINGE | INTRAVENOUS | Status: DC | PRN
  Administered 2021-06-21: 80 mg via INTRAVENOUS

## 2021-06-21 MED ORDER — APIXABAN 5 MG PO TABS
5.0000 mg | ORAL_TABLET | Freq: Once | ORAL | Status: AC
  Administered 2021-06-21: 5 mg via ORAL
  Filled 2021-06-21: qty 1

## 2021-06-21 MED ORDER — FENTANYL CITRATE (PF) 250 MCG/5ML IJ SOLN
INTRAMUSCULAR | Status: DC | PRN
  Administered 2021-06-21: 50 ug via INTRAVENOUS
  Administered 2021-06-21: 25 ug via INTRAVENOUS

## 2021-06-21 MED ORDER — SODIUM CHLORIDE 0.9 % IV SOLN: INTRAVENOUS | Status: DC

## 2021-06-21 MED ORDER — HEPARIN SODIUM (PORCINE) 1000 UNIT/ML IJ SOLN
INTRAMUSCULAR | Status: DC | PRN
  Administered 2021-06-21: 14000 [IU] via INTRAVENOUS
  Administered 2021-06-21: 3000 [IU] via INTRAVENOUS

## 2021-06-21 MED ORDER — MIDAZOLAM HCL 5 MG/5ML IJ SOLN
INTRAMUSCULAR | Status: DC | PRN
  Administered 2021-06-21: 2 mg via INTRAVENOUS

## 2021-06-21 MED ORDER — ACETAMINOPHEN 325 MG PO TABS: 650.0000 mg | ORAL_TABLET | ORAL | Status: DC | PRN

## 2021-06-21 MED ORDER — LIDOCAINE 2% (20 MG/ML) 5 ML SYRINGE
INTRAMUSCULAR | Status: DC | PRN
  Administered 2021-06-21: 60 mg via INTRAVENOUS

## 2021-06-21 MED ORDER — ONDANSETRON HCL 4 MG/2ML IJ SOLN
INTRAMUSCULAR | Status: AC
  Filled 2021-06-21: qty 2

## 2021-06-21 MED ORDER — DOBUTAMINE INFUSION FOR EP/ECHO/NUC (1000 MCG/ML)
INTRAVENOUS | Status: AC
  Filled 2021-06-21: qty 250

## 2021-06-21 MED ORDER — DOBUTAMINE INFUSION FOR EP/ECHO/NUC (1000 MCG/ML)
INTRAVENOUS | Status: DC | PRN
  Administered 2021-06-21: 20 ug/kg/min via INTRAVENOUS

## 2021-06-21 SURGICAL SUPPLY — 20 items
BAG SNAP BAND KOVER 36X36 (MISCELLANEOUS) ×1 IMPLANT
CATH 8FR REPROCESSED SOUNDSTAR (CATHETERS) ×2 IMPLANT
CATH 8FR SOUNDSTAR REPROCESSED (CATHETERS) IMPLANT
CATH OCTARAY 2.0 F 3-3-3-3-3 (CATHETERS) ×1 IMPLANT
CATH S CIRCA THERM PROBE 10F (CATHETERS) ×1 IMPLANT
CATH SMTCH THERMOCOOL SF DF (CATHETERS) ×1 IMPLANT
CATH WEB BI DIR CSDF CRV REPRO (CATHETERS) ×1 IMPLANT
CLOSURE PERCLOSE PROSTYLE (VASCULAR PRODUCTS) ×4 IMPLANT
COVER SWIFTLINK CONNECTOR (BAG) ×3 IMPLANT
KIT VERSACROSS STEERABLE D1 (CATHETERS) ×1 IMPLANT
PACK EP LATEX FREE (CUSTOM PROCEDURE TRAY) ×2
PACK EP LF (CUSTOM PROCEDURE TRAY) ×2 IMPLANT
PAD PRO RADIOLUCENT 2001M-C (PAD) ×3 IMPLANT
PATCH CARTO3 (PAD) ×1 IMPLANT
SHEATH CARTO VIZIGO SM CVD (SHEATH) ×1 IMPLANT
SHEATH PINNACLE 7F 10CM (SHEATH) ×2 IMPLANT
SHEATH PINNACLE 8F 10CM (SHEATH) ×2 IMPLANT
SHEATH PINNACLE 9F 10CM (SHEATH) ×1 IMPLANT
SHEATH PROBE COVER 6X72 (BAG) ×1 IMPLANT
TUBING SMART ABLATE COOLFLOW (TUBING) ×1 IMPLANT

## 2021-06-21 NOTE — H&P (Signed)
Electrophysiology Office Note   Date:  06/21/2021   ID:  Kaitlyn Jimenez, Kaitlyn Jimenez 18-Aug-1951, MRN [id number]  PCP:  Kaitlyn Like, NP  Cardiologist:  Bing Matter Primary Electrophysiologist:  Kaitlyn Jimenez Kaitlyn Loa, MD    Chief Complaint: AF   History of Present Illness: Kaitlyn Jimenez is a 70 y.o. female who is being seen today for the evaluation of AF at the request of No ref. provider found. Presenting today for electrophysiology evaluation.  She has a history significant for coronary artery disease status post stenting in 2007, 2013, and recent stenting of the RCA in 2020.  She presented to her cardiologist office complaining of palpitations.  She wore a cardiac monitor that showed evidence of atrial fibrillation with a burden of less than 1%.  Her longest episode was 4 minutes and 6 seconds.  She was started on anticoagulation.  Today, denies symptoms of palpitations, chest pain, shortness of breath, orthopnea, PND, lower extremity edema, claudication, dizziness, presyncope, syncope, bleeding, or neurologic sequela. The patient is tolerating medications without difficulties. Plan AF ablation today.    Past Medical History:  Diagnosis Date   Atypical chest pain 01/07/2019   Coronary artery disease involving native coronary artery of native heart with angina pectoris (HCC) 07/13/2017   Stenting in 2007, 2013 to LAD, last cardiac cath in 02/2019 w/ prox RCA stent and 50% ISR of LAD stent   Dyslipidemia 07/13/2017   Essential hypertension 07/13/2017   Gastroesophageal reflux disease without esophagitis 05/25/2020   Hypertension    MI (myocardial infarction) (HCC)    MI (myocardial infarction) (HCC)    Other hyperlipidemia 05/25/2020   Other specified hypothyroidism 05/25/2020   Primary hypertension 05/25/2020   Thyroid disease    Unstable angina (HCC) 03/01/2019   Past Surgical History:  Procedure Laterality Date   ABDOMINAL HYSTERECTOMY     CARDIAC CATHETERIZATION      CHOLECYSTECTOMY     CORONARY ANGIOPLASTY WITH STENT PLACEMENT     LEFT HEART CATH AND CORONARY ANGIOGRAPHY N/A 03/02/2019   Procedure: LEFT HEART CATH AND CORONARY ANGIOGRAPHY;  Surgeon: Tonny Bollman, MD;  Location: [facility] INVASIVE CV LAB;  Service: Cardiovascular;  Laterality: N/A;     Current Facility-Administered Medications  Medication Dose Route Frequency Provider Last Rate Last Admin   0.9 %  sodium chloride infusion   Intravenous Continuous Regan Lemming, MD 50 mL/hr at 06/21/21 0616 New Bag at 06/21/21 0616    Allergies:   Bee venom, Penciclovir, Shellfish-derived products, Sulfa antibiotics, Brilinta [ticagrelor], Prasugrel, Iodinated diagnostic agents, Penicillins, and Ramipril   Social History:  The patient  reports that she has quit smoking. She has never used smokeless tobacco. She reports that she does not drink alcohol and does not use drugs.   Family History:  The patient's family history includes Arrhythmia in her sister; Congestive Heart Failure in her brother; Heart attack in her brother and father; Heart disease in her maternal uncle, mother, and paternal uncle; Rheum arthritis in her brother.   ROS:  Please see the history of present illness.   Otherwise, review of systems is positive for none.   All other systems are reviewed and negative.   PHYSICAL EXAM: VS:  BP (!) 223/83   Pulse 99   Temp 97.7 F (36.5 C) (Oral)   Resp 20   Ht 5\' 1"  (1.549 m)   Wt 83 kg   SpO2 100%   BMI 34.58 kg/m  , BMI Body mass index is 34.58 kg/m. GEN:  Well nourished, well developed, in no acute distress  HEENT: normal  Neck: no JVD, carotid bruits, or masses Cardiac: RRR; no murmurs, rubs, or gallops,no edema  Respiratory:  clear to auscultation bilaterally, normal work of breathing GI: soft, nontender, nondistended, + BS MS: no deformity or atrophy  Skin: warm and dry Neuro:  Strength and sensation are intact Psych: euthymic mood, full affect  Recent Labs: 10/30/2020:  Magnesium 2.1 06/06/2021: BUN 11; Creatinine, Ser 0.94; Hemoglobin 13.0; Platelets 307; Potassium 4.9; Sodium 134    Lipid Panel     Component Value Date/Time   CHOL 252 (H) 03/02/2019 0253   TRIG 48 03/02/2019 0253   HDL 56 03/02/2019 0253   CHOLHDL 4.5 03/02/2019 0253   VLDL 10 03/02/2019 0253   LDLCALC 186 (H) 03/02/2019 0253     Wt Readings from Last 3 Encounters:  06/21/21 83 kg  05/02/21 84 kg  04/15/21 84.4 kg      Other studies Reviewed: Additional studies/ records that were reviewed today include: Myoview 04/15/2021   Review of the above records today demonstrates:    Findings are consistent with prior myocardial infarction in LAD distribution. The study is overall low risk.   No ST deviation was noted.   LV perfusion is abnormal. There is no evidence of inducible ischemia. There is evidence of infarction.   Defect 1: There is a medium defect with mild reduction in uptake present in the apical to mid anterior and apex location(s) that is fixed. There is abnormal wall motion in the anteroapex. Consistent with infarction.   Nuclear stress EF: 63 %. The left ventricular ejection fraction is normal (55-65%). Left ventricular function is normal. End diastolic cavity size is normal.   Cardiac monitor 11/23/2020 personally reviewed  Episode of atrial fibrillation noted total burden less than 1% with heart rate between 70 and 157 with average of 101.  Longest episode 4 minutes 6 seconds.  TTE 03/02/2019  1. The left ventricle has normal systolic function with an ejection  fraction of 60-65%. The cavity size was normal. Left ventricular diastolic  function could not be evaluated due to nondiagnostic images. No evidence  of left ventricular regional wall  motion abnormalities.   2. The right ventricle has normal systolic function. The cavity was  normal. There is no increase in right ventricular wall thickness.   3. The aortic valve is tricuspid. Mild sclerosis of the aortic  valve.  Aortic valve regurgitation was not assessed by color flow Doppler.  ASSESSMENT AND PLAN:  1.  Paroxysmal atrial fibrillation: [name] has presented today for surgery, with the diagnosis of atrial fibrillation.  The various methods of treatment have been discussed with the patient and family. After consideration of risks, benefits and other options for treatment, the patient has consented to  Procedure(s): Catheter ablation as a surgical intervention .  Risks include but not limited to complete heart block, stroke, esophageal damage, nerve damage, bleeding, vascular damage, tamponade, perforation, MI, and death. The patient's history has been reviewed, patient examined, no change in status, stable for surgery.  I have reviewed the patient's chart and labs.  Questions were answered to the patient's satisfaction.    Isaiah Cianci Elberta Fortis, MD 06/21/2021 7:06 AM

## 2021-06-21 NOTE — Progress Notes (Signed)
Patient took home blood pressure medicines upon arrival to short stay.

## 2021-06-21 NOTE — Anesthesia Procedure Notes (Addendum)
Procedure Name: Intubation Date/Time: 06/21/2021 7:43 AM Performed by: Vonna Drafts, CRNA Pre-anesthesia Checklist: Patient identified, Emergency Drugs available, Suction available and Patient being monitored Patient Re-evaluated:Patient Re-evaluated prior to induction Oxygen Delivery Method: Circle system utilized Preoxygenation: Pre-oxygenation with 100% oxygen Induction Type: IV induction Ventilation: Two handed mask ventilation required and Oral airway inserted - appropriate to patient size Laryngoscope Size: Mac and 3 Grade View: Grade I Tube type: Oral Tube size: 7.0 mm Number of attempts: 1 Airway Equipment and Method: Stylet and Oral airway Placement Confirmation: ETT inserted through vocal cords under direct vision, positive ETCO2 and breath sounds checked- equal and bilateral Secured at: 22 cm Tube secured with: Tape Dental Injury: Teeth and Oropharynx as per pre-operative assessment

## 2021-06-21 NOTE — Discharge Instructions (Addendum)
Cardiac Ablation, Care After  This sheet gives you information about how to care for yourself after your procedure. Your health care provider may also give you more specific instructions. If you have problems or questions, contact your health care provider. What can I expect after the procedure? After the procedure, it is common to have: Bruising around your puncture site. Tenderness around your puncture site. Skipped heartbeats. Tiredness (fatigue).  Follow these instructions at home: Puncture site care  Follow instructions from your health care provider about how to take care of your puncture site. Make sure you: If present, leave stitches (sutures), skin glue, or adhesive strips in place. These skin closures may need to stay in place for up to 2 weeks. If adhesive strip edges start to loosen and curl up, you may trim the loose edges. Do not remove adhesive strips completely unless your health care provider tells you to do that. If a large square bandage is present, this may be removed 24 hours after surgery.  Check your puncture site every day for signs of infection. Check for: Redness, swelling, or pain. Fluid or blood. If your puncture site starts to bleed, lie down on your back, apply firm pressure to the area, and contact your health care provider. Warmth. Pus or a bad smell. Driving Do not drive for at least 4 days after your procedure or however long your health care provider recommends. (Do not resume driving if you have previously been instructed not to drive for other health reasons.) Do not drive or use heavy machinery while taking prescription pain medicine. Activity Avoid activities that take a lot of effort for at least 7 days after your procedure. Do not lift anything that is heavier than 5 lb (4.5 kg) for one week.  No sexual activity for 1 week.  Return to your normal activities as told by your health care provider. Ask your health care provider what activities are safe  for you. General instructions Take over-the-counter and prescription medicines only as told by your health care provider. Do not use any products that contain nicotine or tobacco, such as cigarettes and e-cigarettes. If you need help quitting, ask your health care provider. You may shower after 24 hours, but Do not take baths, swim, or use a hot tub for 1 week.  Do not drink alcohol for 24 hours after your procedure. Keep all follow-up visits as told by your health care provider. This is important. Contact a health care provider if: You have redness, mild swelling, or pain around your puncture site. You have fluid or blood coming from your puncture site that stops after applying firm pressure to the area. Your puncture site feels warm to the touch. You have pus or a bad smell coming from your puncture site. You have a fever. You have chest pain or discomfort that spreads to your neck, jaw, or arm. You are sweating a lot. You feel nauseous. You have a fast or irregular heartbeat. You have shortness of breath. You are dizzy or light-headed and feel the need to lie down. You have pain or numbness in the arm or leg closest to your puncture site. Get help right away if: Your puncture site suddenly swells. Your puncture site is bleeding and the bleeding does not stop after applying firm pressure to the area. These symptoms may represent a serious problem that is an emergency. Do not wait to see if the symptoms will go away. Get medical help right away. Call your local emergency   services (911 in the [place] Do not drive yourself to the hospital. Summary After the procedure, it is normal to have bruising and tenderness at the puncture site in your groin, neck, or forearm. Check your puncture site every day for signs of infection. Get help right away if your puncture site is bleeding and the bleeding does not stop after applying firm pressure to the area. This is a medical emergency. This  information is not intended to replace advice given to you by your health care provider. Make sure you discuss any questions you have with your health care provider.      Post procedure care instructions No driving for 4 days. No lifting over 5 lbs for 1 week. No vigorous or sexual activity for 1 week. You may return to work/your usual activities on 06/29/21. Keep procedure site clean & dry. If you notice increased pain, swelling, bleeding or pus, call/return!  You may shower after 24 hours, but no soaking in baths/hot tubs/pools for 1 week.    You have an appointment set up with the At[facility] Fibrillation Clinic.  Multiple studies have shown that being followed by a dedicated atrial fibrillation clinic in addition to the standard care you receive from your other physicians improves health. We believe that enrollment in the atrial fibrillation clinic will allow [place] to better care for you.   The phone number to the Atrial Fibrillation Clinic is [phone]. The clinic is staffed Monday through Friday from 8:30am to 5pm.  Parking Directions: The clinic is located in the Heart and Vascular Building connected to [facility]. 1)From [address] turn on to [address] and go to the 3rd entrance  (Heart and Vascular entrance) on the right. 2)Look to the right for Heart &Vascular Parking Garage. 3)A code for the entrance is required, for Nov is 4444.   4)Take the elevators to the 1st floor. Registration is in the room with the glass walls at the end of the hallway.  If you have any trouble parking or locating the clinic, please don't hesitate to call [phone].

## 2021-06-21 NOTE — Anesthesia Preprocedure Evaluation (Addendum)
Anesthesia Evaluation  Patient identified by MRN, date of birth, ID band Patient awake    Reviewed: Allergy & Precautions, NPO status , Patient's Chart, lab work & pertinent test results, reviewed documented beta blocker date and time   Airway Mallampati: II  TM Distance: >3 FB Neck ROM: Full    Dental  (+) Dental Advisory Given, Edentulous Upper, Partial Lower,    Pulmonary former smoker,    Pulmonary exam normal breath sounds clear to auscultation       Cardiovascular hypertension (poorly controlled in preop 199/77, per pt d/t nerves- normally 140/60), Pt. on medications and Pt. on home beta blockers + CAD, + Past MI and + Cardiac Stents (Stenting in 2007, 2013 to LAD, last cardiac cath in 02/2019 w/ prox RCA stent and 50% ISR of LAD stent)  Normal cardiovascular exam+ dysrhythmias (eliquis) Atrial Fibrillation  Rhythm:Regular Rate:Normal  Echo 02/2019: 1. The left ventricle has normal systolic function with an ejection  fraction of 60-65%. The cavity size was normal. Left ventricular diastolic  function could not be evaluated due to nondiagnostic images. No evidence  of left ventricular regional wall  motion abnormalities.  2. The right ventricle has normal systolic function. The cavity was  normal. There is no increase in right ventricular wall thickness.  3. The aortic valve is tricuspid. Mild sclerosis of the aortic valve.  Aortic valve regurgitation was not assessed by color flow Doppler.    Neuro/Psych negative neurological ROS  negative psych ROS   GI/Hepatic Neg liver ROS, GERD  Medicated and Controlled,  Endo/Other  Hypothyroidism BMI 34.6  Renal/GU negative Renal ROS  negative genitourinary   Musculoskeletal negative musculoskeletal ROS (+)   Abdominal   Peds  Hematology   Anesthesia Other Findings   Reproductive/Obstetrics negative OB ROS                            Anesthesia  Physical Anesthesia Plan  ASA: 2  Anesthesia Plan: General   Post-op Pain Management:    Induction: Intravenous  PONV Risk Score and Plan: 3 and Ondansetron, Dexamethasone, Midazolam and Treatment may vary due to age or medical condition  Airway Management Planned: Oral ETT  Additional Equipment: None  Intra-op Plan:   Post-operative Plan: Extubation in OR  Informed Consent: I have reviewed the patients History and Physical, chart, labs and discussed the procedure including the risks, benefits and alternatives for the proposed anesthesia with the patient or authorized representative who has indicated his/her understanding and acceptance.     Dental advisory given  Plan Discussed with: CRNA  Anesthesia Plan Comments:         Anesthesia Quick Evaluation

## 2021-06-21 NOTE — Transfer of Care (Signed)
Immediate Anesthesia Transfer of Care Note  Patient: TIASIA WEBERG  Procedure(s) Performed: ATRIAL FIBRILLATION ABLATION  Patient Location: Cath Lab  Anesthesia Type:General  Level of Consciousness: drowsy  Airway & Oxygen Therapy: Patient Spontanous Breathing and Patient connected to nasal cannula oxygen  Post-op Assessment: Report given to RN and Post -op Vital signs reviewed and stable  Post vital signs: Reviewed and stable  Last Vitals:  Vitals Value Taken Time  BP 159/59 06/21/21 0947  Temp    Pulse 99 06/21/21 0949  Resp 20 06/21/21 0949  SpO2 94 % 06/21/21 0949  Vitals shown include unvalidated device data.  Last Pain:  Vitals:   06/21/21 0539  TempSrc: Oral         Complications: There were no known notable events for this encounter.

## 2021-06-21 NOTE — Anesthesia Postprocedure Evaluation (Signed)
Anesthesia Post Note  Patient: Kaitlyn Jimenez  Procedure(s) Performed: ATRIAL FIBRILLATION ABLATION     Patient location during evaluation: PACU Anesthesia Type: General Level of consciousness: awake and alert, oriented and patient cooperative Pain management: pain level controlled Vital Signs Assessment: post-procedure vital signs reviewed and stable Respiratory status: spontaneous breathing, nonlabored ventilation and respiratory function stable Cardiovascular status: blood pressure returned to baseline and stable Postop Assessment: no apparent nausea or vomiting Anesthetic complications: no   There were no known notable events for this encounter.  Last Vitals:  Vitals:   06/21/21 1015 06/21/21 1030  BP: (!) 158/61 (!) 170/66  Pulse: 96 94  Resp: 20 17  Temp:    SpO2: 99% 98%    Last Pain:  Vitals:   06/21/21 0950  TempSrc: Temporal                 Lannie Fields

## 2021-07-03 ENCOUNTER — Telehealth: Payer: Self-pay | Admitting: Cardiology

## 2021-07-03 NOTE — Telephone Encounter (Signed)
Returned call to pt who reports that she has not taken her Eliquis for 2 days now d/t terrible HA.   States she has had this issue since starting the Eliquis, but could not withstand it anymore. The HA were debilitating. Reports she has not had a HA since stopping the blood thinner. Aware she is a much higher risk for stroke post procedure. Pt does have Xarelto samples at home.  Pt advised to start the Xarelto now, take with meal. She reports that breakfast is her largest meal of the day usually, so pt instructed to take with breakfast. Strongly reminded pt that she should not stop the blood thinner without discussing with Dr. Elberta Fortis first. Pt will start today. Will forward this to Dr. Elberta Fortis for review/advisement

## 2021-07-03 NOTE — Telephone Encounter (Signed)
Pt c/o medication issue:  1. Name of Medication: Eliquis  2. How are you currently taking this medication (dosage and times per day)?  2 times a day  3. Are you having a reaction (difficulty breathing--STAT)?    4. What is your medication issue? Having terrible headaches- just can not deal with the headaches, they are really bad . She have not taken the medicine for the last 2 days

## 2021-07-10 DIAGNOSIS — E785 Hyperlipidemia, unspecified: Secondary | ICD-10-CM | POA: Diagnosis not present

## 2021-07-10 DIAGNOSIS — R7303 Prediabetes: Secondary | ICD-10-CM | POA: Diagnosis not present

## 2021-07-10 DIAGNOSIS — F419 Anxiety disorder, unspecified: Secondary | ICD-10-CM | POA: Diagnosis not present

## 2021-07-10 DIAGNOSIS — I4891 Unspecified atrial fibrillation: Secondary | ICD-10-CM | POA: Diagnosis not present

## 2021-07-10 DIAGNOSIS — E063 Autoimmune thyroiditis: Secondary | ICD-10-CM | POA: Diagnosis not present

## 2021-07-10 DIAGNOSIS — I10 Essential (primary) hypertension: Secondary | ICD-10-CM | POA: Diagnosis not present

## 2021-07-10 DIAGNOSIS — E538 Deficiency of other specified B group vitamins: Secondary | ICD-10-CM | POA: Diagnosis not present

## 2021-07-10 DIAGNOSIS — R7989 Other specified abnormal findings of blood chemistry: Secondary | ICD-10-CM | POA: Diagnosis not present

## 2021-07-10 DIAGNOSIS — Z79899 Other long term (current) drug therapy: Secondary | ICD-10-CM | POA: Diagnosis not present

## 2021-07-22 ENCOUNTER — Ambulatory Visit (HOSPITAL_COMMUNITY)
Admission: RE | Admit: 2021-07-22 | Discharge: 2021-07-22 | Disposition: A | Discharge status: Home / Self Care | Payer: Medicare HMO | Source: Ambulatory Visit | Attending: Physician Assistant | Admitting: Physician Assistant

## 2021-07-22 ENCOUNTER — Other Ambulatory Visit: Payer: Self-pay

## 2021-07-22 VITALS — BP 198/86 | HR 65 | Ht 61.0 in | Wt 184.8 lb

## 2021-07-22 DIAGNOSIS — Z7182 Exercise counseling: Secondary | ICD-10-CM | POA: Diagnosis not present

## 2021-07-22 DIAGNOSIS — E785 Hyperlipidemia, unspecified: Secondary | ICD-10-CM | POA: Insufficient documentation

## 2021-07-22 DIAGNOSIS — I10 Essential (primary) hypertension: Secondary | ICD-10-CM | POA: Insufficient documentation

## 2021-07-22 DIAGNOSIS — I2510 Atherosclerotic heart disease of native coronary artery without angina pectoris: Secondary | ICD-10-CM | POA: Diagnosis not present

## 2021-07-22 DIAGNOSIS — E669 Obesity, unspecified: Secondary | ICD-10-CM | POA: Insufficient documentation

## 2021-07-22 DIAGNOSIS — D6869 Other thrombophilia: Secondary | ICD-10-CM | POA: Insufficient documentation

## 2021-07-22 DIAGNOSIS — I480 Paroxysmal atrial fibrillation: Secondary | ICD-10-CM | POA: Insufficient documentation

## 2021-07-22 DIAGNOSIS — Z7901 Long term (current) use of anticoagulants: Secondary | ICD-10-CM | POA: Diagnosis not present

## 2021-07-22 DIAGNOSIS — Z6834 Body mass index (BMI) 34.0-34.9, adult: Secondary | ICD-10-CM | POA: Diagnosis not present

## 2021-07-22 DIAGNOSIS — Z79899 Other long term (current) drug therapy: Secondary | ICD-10-CM | POA: Diagnosis not present

## 2021-07-22 MED ORDER — RIVAROXABAN 20 MG PO TABS: 20.0000 mg | ORAL_TABLET | Freq: Every day | ORAL | 6 refills | Status: DC

## 2021-07-22 NOTE — Progress Notes (Signed)
Primary Care Physician: Jim Like, NP Primary Cardiologist: Dr Bing Matter Primary Electrophysiologist: Dr Elberta Fortis Referring Physician: Dr Geanie Kenning is a 70 y.o. female with a history of CAD, HLD, HTN, atrial fibrillation who presents for follow up in the [facility]. She wore a cardiac monitor 10/2020 that showed evidence of atrial fibrillation with a burden of less than 1%. Patient has a CHADS2VASC score of 4. She is s/p afib ablation with Dr Elberta Fortis on 06/21/21. She reports that he afib is much better with minimal palpitations. She does report that she ran out of Xarelto. She denies CP, swallowing pain, or groin issues.   Today, she denies symptoms of palpitations, chest pain, shortness of breath, orthopnea, PND, lower extremity edema, dizziness, presyncope, syncope, snoring, daytime somnolence, bleeding, or neurologic sequela. The patient is tolerating medications without difficulties and is otherwise without complaint today.    Atrial Fibrillation Risk Factors:  she does not have symptoms or diagnosis of sleep apnea. she does not have a history of rheumatic fever.   she has a BMI of Body mass index is 34.92 kg/m.Marland Kitchen Filed Weights   07/22/21 1022  Weight: 83.8 kg    Family History  Problem Relation Age of Onset   Heart disease Mother    Heart attack Father    Heart attack Brother    Congestive Heart Failure Brother    Rheum arthritis Brother    Arrhythmia Sister    Heart disease Maternal Uncle    Heart disease Paternal Uncle      Atrial Fibrillation Management history:  Previous antiarrhythmic drugs: none Previous cardioversions: none Previous ablations: 06/21/21 CHADS2VASC score: 4 Anticoagulation history: Xarelto, Eliquis   Past Medical History:  Diagnosis Date   Atypical chest pain 01/07/2019   Coronary artery disease involving native coronary artery of native heart with angina pectoris (HCC) 07/13/2017    Stenting in 2007, 2013 to LAD, last cardiac cath in 02/2019 w/ prox RCA stent and 50% ISR of LAD stent   Dyslipidemia 07/13/2017   Essential hypertension 07/13/2017   Gastroesophageal reflux disease without esophagitis 05/25/2020   Hypertension    MI (myocardial infarction) (HCC)    MI (myocardial infarction) (HCC)    Other hyperlipidemia 05/25/2020   Other specified hypothyroidism 05/25/2020   Primary hypertension 05/25/2020   Thyroid disease    Unstable angina (HCC) 03/01/2019   Past Surgical History:  Procedure Laterality Date   ABDOMINAL HYSTERECTOMY     ATRIAL FIBRILLATION ABLATION N/A 06/21/2021   Procedure: ATRIAL FIBRILLATION ABLATION;  Surgeon: Regan Lemming, MD;  Location: [facility] INVASIVE CV LAB;  Service: Cardiovascular;  Laterality: N/A;   CARDIAC CATHETERIZATION     CHOLECYSTECTOMY     CORONARY ANGIOPLASTY WITH STENT PLACEMENT     LEFT HEART CATH AND CORONARY ANGIOGRAPHY N/A 03/02/2019   Procedure: LEFT HEART CATH AND CORONARY ANGIOGRAPHY;  Surgeon: Tonny Bollman, MD;  Location: [facility] INVASIVE CV LAB;  Service: Cardiovascular;  Laterality: N/A;    Current Outpatient Medications  Medication Sig Dispense Refill   ALPRAZolam (XANAX) 1 MG tablet Take 1 mg by mouth 2 (two) times daily.     aspirin EC 81 MG tablet Take 1 tablet (81 mg total) by mouth daily. Swallow whole. (Patient taking differently: Take 81 mg by mouth daily in the afternoon. Swallow whole.) 90 tablet 3   cyanocobalamin (,VITAMIN B-12,) 1000 MCG/ML injection Inject 1,000 mcg into the muscle every 30 (thirty) days.  EPINEPHrine 0.3 mg/0.3 mL IJ SOAJ injection Inject 0.3 mg into the muscle as needed for anaphylaxis.     famotidine (PEPCID) 20 MG tablet Take 20 mg by mouth daily as needed for heartburn or indigestion.     fluticasone (FLONASE) 50 MCG/ACT nasal spray Place 1 spray into both nostrils daily as needed for allergies.     levothyroxine (SYNTHROID) 88 MCG tablet Take 88 mcg by mouth daily before breakfast.      metoprolol tartrate (LOPRESSOR) 50 MG tablet Take 1 tablet (50 mg total) by mouth 2 (two) times daily. 60 tablet 3   mupirocin ointment (BACTROBAN) 2 % Apply 1 application topically 3 (three) times daily.     nitroGLYCERIN (NITROSTAT) 0.4 MG SL tablet Place 1 tablet (0.4 mg total) under the tongue every 5 (five) minutes as needed for chest pain. 25 tablet 2   polyvinyl alcohol (LIQUIFILM TEARS) 1.4 % ophthalmic solution Place 1 drop into both eyes as needed for dry eyes.     pravastatin (PRAVACHOL) 40 MG tablet Take 40 mg by mouth once a week.     sodium chloride (OCEAN) 0.65 % SOLN nasal spray Place 1 spray into both nostrils as needed for congestion.     triamcinolone ointment (KENALOG) 0.1 % Apply 1 application topically as needed (demititis).     valsartan (DIOVAN) 160 MG tablet Take 1 tablet (160 mg total) by mouth daily. 90 tablet 1   Vitamin D, Ergocalciferol, (DRISDOL) 1.25 MG (50000 UT) CAPS capsule Take 50,000 Units by mouth every 30 (thirty) days.     rivaroxaban (XARELTO) 20 MG TABS tablet Take 1 tablet (20 mg total) by mouth daily with supper. 30 tablet 6   No current facility-administered medications for this encounter.    Allergies  Allergen Reactions   Bee Venom Anaphylaxis   Penciclovir Hives   Shellfish-Derived Products Other (See Comments)    Breathing difficulty   Sulfa Antibiotics Hives and Palpitations   Brilinta [Ticagrelor]     Shortness of breath   Eliquis [Apixaban] Other (See Comments)    Caused severe headaches, blurred vision   Prasugrel Other (See Comments)    Hives    Iodinated Diagnostic Agents Palpitations    Breathing difficulty Breaks out also  Hx of breakthru reaction - radiology advises against use of contrast despite steroid/antihistamine prep   Penicillins Hives and Rash    Did it involve swelling of the face/tongue/throat, SOB, or low BP? No Did it involve sudden or severe rash/hives, skin peeling, or any reaction on the inside of your  mouth or nose? Yes Did you need to seek medical attention at a hospital or doctor's office? No When did it last happen?    Pt was a child    If all above answers are "NO", may proceed with cephalosporin use.  Pt states that she also passes out    Ramipril Nausea And Vomiting and Other (See Comments)    pancreatis pancreaitis    Social History   Socioeconomic History   Marital status: Married    Spouse name: Not on file   Number of children: Not on file   Years of education: Not on file   Highest education level: Not on file  Occupational History   Not on file  Tobacco Use   Smoking status: Former   Smokeless tobacco: Never  Vaping Use   Vaping Use: Never used  Substance and Sexual Activity   Alcohol use: No   Drug use: No  Sexual activity: Not on file  Other Topics Concern   Not on file  Social History Narrative   Not on file   Social Determinants of Health   Financial Resource Strain: Not on file  Food Insecurity: Not on file  Transportation Needs: Not on file  Physical Activity: Not on file  Stress: Not on file  Social Connections: Not on file  Intimate Partner Violence: Not on file     ROS- All systems are reviewed and negative except as per the HPI above.  Physical Exam: Vitals:   07/22/21 1022  BP: (!) 198/86  Pulse: 65  Weight: 83.8 kg  Height: 5\' 1"  (1.549 m)    GEN- The patient is a well appearing obese female, alert and oriented x 3 today.   Head- normocephalic, atraumatic Eyes-  Sclera clear, conjunctiva pink Ears- hearing intact Oropharynx- clear Neck- supple  Lungs- Clear to ausculation bilaterally, normal work of breathing Heart- Regular rate and rhythm, no murmurs, rubs or gallops  GI- soft, NT, ND, + BS Extremities- no clubbing, cyanosis, or edema MS- no significant deformity or atrophy Skin- no rash or lesion Psych- euthymic mood, full affect Neuro- strength and sensation are intact  Wt Readings from Last 3 Encounters:   07/22/21 83.8 kg  06/21/21 83 kg  05/02/21 84 kg    EKG today demonstrates  SR, LBBB Vent. rate 65 BPM PR interval 164 ms QRS duration 134 ms QT/QTcB 414/430 ms  Echo 03/02/19 demonstrated   1. The left ventricle has normal systolic function with an ejection  fraction of 60-65%. The cavity size was normal. Left ventricular diastolic function could not be evaluated due to nondiagnostic images. No evidence of left ventricular regional wall motion abnormalities.   2. The right ventricle has normal systolic function. The cavity was  normal. There is no increase in right ventricular wall thickness.   3. The aortic valve is tricuspid. Mild sclerosis of the aortic valve.  Aortic valve regurgitation was not assessed by color flow Doppler.   Epic records are reviewed at length today  CHA2DS2-VASc Score = 4  The patient's score is based upon: CHF History: 0 HTN History: 1 Diabetes History: 0 Stroke History: 0 Vascular Disease History: 1 Age Score: 1 Gender Score: 1      ASSESSMENT AND PLAN: 1. Paroxysmal Atrial Fibrillation (ICD10:  I48.0) The patient's CHA2DS2-VASc score is 4, indicating a 4.8% annual risk of stroke.   S/p afib ablation 06/21/21. Patient appears to be maintaining SR.  Could not tolerate Eliquis 2/2 headaches.  Start Xarelto 20 mg daily with no missed doses for 3 months post ablation. Stressed importance of taking as prescribed.  Continue Lopressor 50 mg BID  2. Secondary Hypercoagulable State (ICD10:  D68.69) The patient is at significant risk for stroke/thromboembolism based upon her CHA2DS2-VASc Score of 4.  Continue Rivaroxaban (Xarelto).   3. Obesity Body mass index is 34.92 kg/m. Lifestyle modification was discussed at length including regular exercise and weight reduction.  4. CAD No anginal symptoms.  5. HTN Elevated today, she just took her morning medication. Better on recheck. Reassess at follow up.   Follow up with Dr Agustin Cree and Dr  Curt Bears as scheduled.    [facility] [address] [place], [place] [address] [phone] 07/22/2021 10:41 AM

## 2021-07-22 NOTE — Patient Instructions (Signed)
Restart Xarelto 20mg  once a day

## 2021-07-23 DIAGNOSIS — N390 Urinary tract infection, site not specified: Secondary | ICD-10-CM | POA: Diagnosis not present

## 2021-07-23 DIAGNOSIS — R350 Frequency of micturition: Secondary | ICD-10-CM | POA: Diagnosis not present

## 2021-07-29 ENCOUNTER — Other Ambulatory Visit (HOSPITAL_COMMUNITY): Payer: Self-pay

## 2021-07-29 MED ORDER — RIVAROXABAN 20 MG PO TABS: 20.0000 mg | ORAL_TABLET | Freq: Every day | ORAL | 0 refills | Status: DC

## 2021-09-02 ENCOUNTER — Other Ambulatory Visit: Payer: Self-pay

## 2021-09-02 ENCOUNTER — Ambulatory Visit (INDEPENDENT_AMBULATORY_CARE_PROVIDER_SITE_OTHER): Payer: Medicare HMO | Admitting: Cardiology

## 2021-09-02 ENCOUNTER — Encounter: Payer: Self-pay | Admitting: Cardiology

## 2021-09-02 VITALS — BP 152/70 | HR 77 | Ht 62.0 in | Wt 186.0 lb

## 2021-09-02 DIAGNOSIS — I10 Essential (primary) hypertension: Secondary | ICD-10-CM | POA: Diagnosis not present

## 2021-09-02 DIAGNOSIS — Z9889 Other specified postprocedural states: Secondary | ICD-10-CM | POA: Diagnosis not present

## 2021-09-02 DIAGNOSIS — E785 Hyperlipidemia, unspecified: Secondary | ICD-10-CM | POA: Diagnosis not present

## 2021-09-02 DIAGNOSIS — Z8679 Personal history of other diseases of the circulatory system: Secondary | ICD-10-CM | POA: Diagnosis not present

## 2021-09-02 DIAGNOSIS — I25119 Atherosclerotic heart disease of native coronary artery with unspecified angina pectoris: Secondary | ICD-10-CM | POA: Diagnosis not present

## 2021-09-02 NOTE — Patient Instructions (Signed)

## 2021-09-02 NOTE — Progress Notes (Signed)
Cardiology Office Note:    Date:  09/02/2021   ID:  Kaitlyn Jimenez, DOB 1951/04/23, MRN [id number]  PCP:  Earlyne Iba, NP  Cardiologist:  Jenne Campus, MD    Referring MD: Penelope Coop, FNP   Chief Complaint  Patient presents with   follow up ablation  I am doing much better after atrial fibrillation ablation  History of Present Illness:    Kaitlyn Jimenez is a 71 y.o. female   with past medical history significant for coronary artery disease, she did have a PTCA and stenting done in 2007, 2013 and recently stenting of the right coronary artery done in December 2020.  She has been put appropriately for dual antiplatelet therapy, she did have some difficulty taking Brilinta eventually she ended up being on Plavix Recently she started complaining of having palpitations, she wore a monitor which showed evidence of atrial fibrillation.  Total burden was less than 1% rate was between 70 and 157 with an average rate of 101, longest episode 4 minutes 6 seconds.  Since that time she has been put on anticoagulation.  Eventually she ended up being referred to our EP clinic for evaluation for more advanced management of her atrial fibrillation she did have atrial fibrillation ablation performed at the end of October and she is doing great since that time she told yesterday that the quality of her life is dramatically better she is very happy and satisfied with the care she got.   She denies have any chest pain tightness squeezing pressure burning chest.  Past Medical History:  Diagnosis Date   Atypical chest pain 01/07/2019   Coronary artery disease involving native coronary artery of native heart with angina pectoris ([place]) 07/13/2017   Stenting in 2007, 2013 to LAD, last cardiac cath in 02/2019 w/ prox RCA stent and 50% ISR of LAD stent   Dyslipidemia 07/13/2017   Essential hypertension 07/13/2017   Gastroesophageal reflux disease without esophagitis 05/25/2020   Hypertension    MI  (myocardial infarction) ([place])    MI (myocardial infarction) ([place])    Other hyperlipidemia 05/25/2020   Other specified hypothyroidism 05/25/2020   Primary hypertension 05/25/2020   Thyroid disease    Unstable angina ([place]) 03/01/2019    Past Surgical History:  Procedure Laterality Date   ABDOMINAL HYSTERECTOMY     ATRIAL FIBRILLATION ABLATION N/A 06/21/2021   Procedure: ATRIAL FIBRILLATION ABLATION;  Surgeon: Constance Haw, MD;  Location: [place] CV LAB;  Service: Cardiovascular;  Laterality: N/A;   CARDIAC CATHETERIZATION     CHOLECYSTECTOMY     CORONARY ANGIOPLASTY WITH STENT PLACEMENT     LEFT HEART CATH AND CORONARY ANGIOGRAPHY N/A 03/02/2019   Procedure: LEFT HEART CATH AND CORONARY ANGIOGRAPHY;  Surgeon: Sherren Mocha, MD;  Location: [place] CV LAB;  Service: Cardiovascular;  Laterality: N/A;    Current Medications: Current Meds  Medication Sig   ALPRAZolam (XANAX) 1 MG tablet Take 1 mg by mouth 2 (two) times daily.   aspirin EC 81 MG tablet Take 1 tablet (81 mg total) by mouth daily. Swallow whole.   cyanocobalamin (,VITAMIN B-12,) 1000 MCG/ML injection Inject 1,000 mcg into the muscle every 30 (thirty) days.   EPINEPHrine 0.3 mg/0.3 mL IJ SOAJ injection Inject 0.3 mg into the muscle as needed for anaphylaxis.   famotidine (PEPCID) 20 MG tablet Take 20 mg by mouth daily as needed for heartburn or indigestion.   fluticasone (FLONASE) 50 MCG/ACT nasal spray Place 1 spray into both  nostrils daily as needed for allergies.   levothyroxine (SYNTHROID) 88 MCG tablet Take 88 mcg by mouth daily before breakfast.   metoprolol tartrate (LOPRESSOR) 50 MG tablet Take 1 tablet (50 mg total) by mouth 2 (two) times daily.   mupirocin ointment (BACTROBAN) 2 % Apply 1 application topically 3 (three) times daily.   nitroGLYCERIN (NITROSTAT) 0.4 MG SL tablet Place 1 tablet (0.4 mg total) under the tongue every 5 (five) minutes as needed for chest pain.   polyvinyl alcohol (LIQUIFILM  TEARS) 1.4 % ophthalmic solution Place 1 drop into both eyes as needed for dry eyes.   pravastatin (PRAVACHOL) 40 MG tablet Take 40 mg by mouth once a week.   rivaroxaban (XARELTO) 20 MG TABS tablet Take 1 tablet (20 mg total) by mouth daily with supper.   sodium chloride (OCEAN) 0.65 % SOLN nasal spray Place 1 spray into both nostrils as needed for congestion.   triamcinolone ointment (KENALOG) 0.1 % Apply 1 application topically as needed (demititis).   valsartan (DIOVAN) 160 MG tablet Take 1 tablet (160 mg total) by mouth daily.   Vitamin D, Ergocalciferol, (DRISDOL) 1.25 MG (50000 UT) CAPS capsule Take 50,000 Units by mouth every 30 (thirty) days.     Allergies:   Bee venom, Penciclovir, Shellfish-derived products, Sulfa antibiotics, Brilinta [ticagrelor], Eliquis [apixaban], Prasugrel, Iodinated contrast media, Penicillins, and Ramipril   Social History   Socioeconomic History   Marital status: Married    Spouse name: Not on file   Number of children: Not on file   Years of education: Not on file   Highest education level: Not on file  Occupational History   Not on file  Tobacco Use   Smoking status: Former   Smokeless tobacco: Never  Vaping Use   Vaping Use: Never used  Substance and Sexual Activity   Alcohol use: No   Drug use: No   Sexual activity: Not on file  Other Topics Concern   Not on file  Social History Narrative   Not on file   Social Determinants of Health   Financial Resource Strain: Not on file  Food Insecurity: Not on file  Transportation Needs: Not on file  Physical Activity: Not on file  Stress: Not on file  Social Connections: Not on file     Family History: The patient's family history includes Arrhythmia in her sister; Congestive Heart Failure in her brother; Heart attack in her brother and father; Heart disease in her maternal uncle, mother, and paternal uncle; Rheum arthritis in her brother. ROS:   Please see the history of present illness.     All 14 point review of systems negative except as described per history of present illness  EKGs/Labs/Other Studies Reviewed:      Recent Labs: 10/30/2020: Magnesium 2.1 06/06/2021: BUN 11; Creatinine, Ser 0.94; Hemoglobin 13.0; Platelets 307; Potassium 4.9; Sodium 134  Recent Lipid Panel    Component Value Date/Time   CHOL 252 (H) 03/02/2019 0253   TRIG 48 03/02/2019 0253   HDL 56 03/02/2019 0253   CHOLHDL 4.5 03/02/2019 0253   VLDL 10 03/02/2019 0253   LDLCALC 186 (H) 03/02/2019 0253    Physical Exam:    VS:  BP (!) 152/70 (BP Location: Left Arm, Patient Position: Sitting)    Pulse 77    Ht 5\' 2"  (1.575 m)    Wt 186 lb (84.4 kg)    SpO2 98%    BMI 34.02 kg/m     Wt Readings from Last  3 Encounters:  09/02/21 186 lb (84.4 kg)  07/22/21 184 lb 12.8 oz (83.8 kg)  06/21/21 183 lb (83 kg)     GEN:  Well nourished, well developed in no acute distress HEENT: Normal NECK: No JVD; No carotid bruits LYMPHATICS: No lymphadenopathy CARDIAC: RRR, no murmurs, no rubs, no gallops RESPIRATORY:  Clear to auscultation without rales, wheezing or rhonchi  ABDOMEN: Soft, non-tender, non-distended MUSCULOSKELETAL:  No edema; No deformity  SKIN: Warm and dry LOWER EXTREMITIES: no swelling NEUROLOGIC:  Alert and oriented x 3 PSYCHIATRIC:  Normal affect   ASSESSMENT:    1. Coronary artery disease involving native coronary artery of native heart with angina pectoris ([place])   2. Essential hypertension   3. Primary hypertension   4. Dyslipidemia   5. Status post ablation of atrial fibrillation    PLAN:    In order of problems listed above:  Coronary disease stable from that point review she is anticoagulated as well as on antiplatelet therapy which I will continue because of frequent recurrences or problems Status post atrial fibrillation ablation maintained sinus rhythm, baseline left bundle branch 2.  Doing well overall continue anticoagulation for now. Essential hypertension still  slightly high but she is afraid to take any new medications. Dyslipidemia her cholesterol is absolutely unacceptable.  Her LDL is 162 HDL 46 she does have allergy to statin also have difficulty taking Zetia we try a almost everything however I think she can benefit from [facility], and I will refer her to our lipid clinic to consider that.   Medication Adjustments/Labs and Tests Ordered: Current medicines are reviewed at length with the patient today.  Concerns regarding medicines are outlined above.  No orders of the defined types were placed in this encounter.  Medication changes: No orders of the defined types were placed in this encounter.   Signed, Park Liter, MD, [facility] 09/02/2021 1:26 PM    [facility]

## 2021-09-30 ENCOUNTER — Ambulatory Visit: Payer: Medicare HMO | Admitting: Cardiology

## 2021-09-30 ENCOUNTER — Telehealth: Payer: Self-pay | Admitting: Cardiology

## 2021-09-30 NOTE — Telephone Encounter (Signed)
Spoke with pt who is concerned with her BP readings but request it to be managed by Dr. Elberta Fortis. Pt request to see Dr. Elberta Fortis as she has not been seen since her Ablation 10/22. Pt states that she does not mind going to another office to see him. Also told pt to bring her BP cuff into the office so we could check it against her monitor to make sure her readings are accurate as she states that her systolic is 120-140, diastolic is ranging 40-50 and heart rate is 60-70 with occasional 90. Pt states she has decreased her Metoprolol to 50 mg in the am and 25 mg in the pm. Once again pt request Dr. Elberta Fortis manage her medication for her BP as he done her ablation.

## 2021-09-30 NOTE — Telephone Encounter (Signed)
Called pt to reschedule appt due to schedule change. Patient states she is worried because her bottom number of her blood pressure has been in the forties.  Please advise  [phone]  Thank you!

## 2021-10-03 NOTE — Telephone Encounter (Signed)
Called pt to inform her that Dr. Elberta Fortis recommendation was to f/u w/ Dr Bing Matter, and that typically EP does not follow BP. Pt is doing well, her only concern was because her diastolic is dropping into the 50V sometimes. She is decreased her bedtime Lopressor to 25 mg, this has helped some but not much. Advised if continues or other BP concern to follow up with Dr. Mauri Brooklyn or PCP. Patient verbalized understanding and agreeable to plan.

## 2021-10-03 NOTE — Telephone Encounter (Signed)
Called patient and got more information. Patient would like to wait and see Dr. Elberta Fortis regarding her blood pressure concerns and an appointment with Dr. Elberta Fortis is already scheduled. She is knowledgeable about her care and will call us back if the symptoms get worse. Patient has no questions or concerns at this time.

## 2021-10-15 ENCOUNTER — Encounter: Payer: Self-pay | Admitting: Cardiology

## 2021-10-15 ENCOUNTER — Other Ambulatory Visit: Payer: Self-pay

## 2021-10-15 ENCOUNTER — Ambulatory Visit: Payer: Medicare HMO | Admitting: Cardiology

## 2021-10-15 VITALS — BP 126/80 | HR 69 | Ht 62.0 in | Wt 187.6 lb

## 2021-10-15 DIAGNOSIS — I480 Paroxysmal atrial fibrillation: Secondary | ICD-10-CM

## 2021-10-15 NOTE — Patient Instructions (Addendum)
Medication Instructions:  °Your physician has recommended you make the following change in your medication:  °STOP Xarelto ° °*If you need a refill on your cardiac medications before your next appointment, please call your pharmacy* ° ° °Lab Work: °None ordered ° ° °Testing/Procedures: °None ordered ° ° °Follow-Up: °At [facility], you and your health needs are our priority.  As part of our continuing mission to provide you with exceptional heart care, we have created designated Provider Care Teams.  These Care Teams include your primary Cardiologist (physician) and Advanced Practice Providers (APPs -  Physician Assistants and Nurse Practitioners) who all work together to provide you with the care you need, when you need it. ° °Your next appointment:   °3 month(s) ° °The format for your next appointment:   °In Person ° °Provider:   °Will Camnitz, MD ° ° ° °Thank you for choosing [facility]!! ° ° °Timira Bieda, RN °[phone] °  °

## 2021-10-15 NOTE — Progress Notes (Signed)
Electrophysiology Office Note   Date:  10/15/2021   ID:  Kaitlyn, Jimenez [birth year], MRN [id number]  PCP:  Earlyne Iba, NP  Cardiologist:  Agustin Cree Primary Electrophysiologist:  Constance Haw, MD    Chief Complaint: AF   History of Present Illness: Kaitlyn Jimenez is a 71 y.o. female who is being seen today for the evaluation of AF at the request of Potts, Georgeann Oppenheim, NP. Presenting today for electrophysiology evaluation.  She has a history significant for coronary artery disease status post stent in 2007, 2013, most recently stenting of the RCA in 2020.  She presented to the cardiology office with palpitations.  She wore a monitor that showed a 1% atrial fibrillation burden.  She is now status post ablation 06/21/2021.  Today, denies symptoms of palpitations, chest pain, shortness of breath, orthopnea, PND, lower extremity edema, claudication, dizziness, presyncope, syncope, bleeding, or neurologic sequela. The patient is tolerating medications without difficulties.  She is continued to have short episodes of palpitations but otherwise feels well.  She is stopped her Xarelto 6 weeks ago and does not wish to restart it.  Otherwise she has no complaints and is happy with her care.   Past Medical History:  Diagnosis Date   Atypical chest pain 01/07/2019   Coronary artery disease involving native coronary artery of native heart with angina pectoris ([place]) 07/13/2017   Stenting in 2007, 2013 to LAD, last cardiac cath in 02/2019 w/ prox RCA stent and 50% ISR of LAD stent   Dyslipidemia 07/13/2017   Essential hypertension 07/13/2017   Gastroesophageal reflux disease without esophagitis 05/25/2020   Hypertension    MI (myocardial infarction) ([place])    MI (myocardial infarction) ([place])    Other hyperlipidemia 05/25/2020   Other specified hypothyroidism 05/25/2020   Primary hypertension 05/25/2020   Thyroid disease    Unstable angina ([place]) 03/01/2019   Past Surgical History:   Procedure Laterality Date   ABDOMINAL HYSTERECTOMY     ATRIAL FIBRILLATION ABLATION N/A 06/21/2021   Procedure: ATRIAL FIBRILLATION ABLATION;  Surgeon: Constance Haw, MD;  Location: [facility];  Service: Cardiovascular;  Laterality: N/A;   CARDIAC CATHETERIZATION     CHOLECYSTECTOMY     CORONARY ANGIOPLASTY WITH STENT PLACEMENT     LEFT HEART CATH AND CORONARY ANGIOGRAPHY N/A 03/02/2019   Procedure: LEFT HEART CATH AND CORONARY ANGIOGRAPHY;  Surgeon: Sherren Mocha, MD;  Location: [facility];  Service: Cardiovascular;  Laterality: N/A;     Current Outpatient Medications  Medication Sig Dispense Refill   ALPRAZolam (XANAX) 1 MG tablet Take 1 mg by mouth 2 (two) times daily.     aspirin EC 81 MG tablet Take 1 tablet (81 mg total) by mouth daily. Swallow whole. 90 tablet 3   cyanocobalamin (,VITAMIN B-12,) 1000 MCG/ML injection Inject 1,000 mcg into the muscle every 30 (thirty) days.     EPINEPHrine 0.3 mg/0.3 mL IJ SOAJ injection Inject 0.3 mg into the muscle as needed for anaphylaxis.     famotidine (PEPCID) 20 MG tablet Take 20 mg by mouth daily as needed for heartburn or indigestion.     fluticasone (FLONASE) 50 MCG/ACT nasal spray Place 1 spray into both nostrils daily as needed for allergies.     levothyroxine (SYNTHROID) 88 MCG tablet Take 88 mcg by mouth daily before breakfast.     metoprolol tartrate (LOPRESSOR) 50 MG tablet Take 1 tablet (50 mg total) by mouth 2 (two) times daily. 60 tablet 3  mupirocin ointment (BACTROBAN) 2 % Apply 1 application topically 3 (three) times daily.     nitroGLYCERIN (NITROSTAT) 0.4 MG SL tablet Place 1 tablet (0.4 mg total) under the tongue every 5 (five) minutes as needed for chest pain. 25 tablet 2   polyvinyl alcohol (LIQUIFILM TEARS) 1.4 % ophthalmic solution Place 1 drop into both eyes as needed for dry eyes.     pravastatin (PRAVACHOL) 40 MG tablet Take 40 mg by mouth once a week.     sodium chloride (OCEAN) 0.65 % SOLN  nasal spray Place 1 spray into both nostrils as needed for congestion.     triamcinolone ointment (KENALOG) 0.1 % Apply 1 application topically as needed (demititis).     valsartan (DIOVAN) 160 MG tablet Take 1 tablet (160 mg total) by mouth daily. 90 tablet 1   Vitamin D, Ergocalciferol, (DRISDOL) 1.25 MG (50000 UT) CAPS capsule Take 50,000 Units by mouth every 30 (thirty) days.     No current facility-administered medications for this visit.    Allergies:   Bee venom, Penciclovir, Shellfish-derived products, Sulfa antibiotics, Brilinta [ticagrelor], Eliquis [apixaban], Prasugrel, Iodinated contrast media, Penicillins, and Ramipril   Social History:  The patient  reports that she has quit smoking. She has never used smokeless tobacco. She reports that she does not drink alcohol and does not use drugs.   Family History:  The patient's family history includes Arrhythmia in her sister; Congestive Heart Failure in her brother; Heart attack in her brother and father; Heart disease in her maternal uncle, mother, and paternal uncle; Rheum arthritis in her brother.   ROS:  Please see the history of present illness.   Otherwise, review of systems is positive for none.   All other systems are reviewed and negative.   PHYSICAL EXAM: VS:  BP 126/80    Pulse 69    Ht 5\' 2"  (1.575 m)    Wt 187 lb 9.6 oz (85.1 kg)    SpO2 98%    BMI 34.31 kg/m  , BMI Body mass index is 34.31 kg/m. GEN: Well nourished, well developed, in no acute distress  HEENT: normal  Neck: no JVD, carotid bruits, or masses Cardiac: RRR; no murmurs, rubs, or gallops,no edema  Respiratory:  clear to auscultation bilaterally, normal work of breathing GI: soft, nontender, nondistended, + BS MS: no deformity or atrophy  Skin: warm and dry Neuro:  Strength and sensation are intact Psych: euthymic mood, full affect  EKG:  EKG is ordered today. Personal review of the ekg ordered shows sinus rhythm, left bundle branch block, rate  69  Recent Labs: 10/30/2020: Magnesium 2.1 06/06/2021: BUN 11; Creatinine, Ser 0.94; Hemoglobin 13.0; Platelets 307; Potassium 4.9; Sodium 134    Lipid Panel     Component Value Date/Time   CHOL 252 (H) 03/02/2019 0253   TRIG 48 03/02/2019 0253   HDL 56 03/02/2019 0253   CHOLHDL 4.5 03/02/2019 0253   VLDL 10 03/02/2019 0253   LDLCALC 186 (H) 03/02/2019 0253     Wt Readings from Last 3 Encounters:  10/15/21 187 lb 9.6 oz (85.1 kg)  09/02/21 186 lb (84.4 kg)  07/22/21 184 lb 12.8 oz (83.8 kg)      Other studies Reviewed: Additional studies/ records that were reviewed today include: Myoview 04/15/2021   Review of the above records today demonstrates:    Findings are consistent with prior myocardial infarction in LAD distribution. The study is overall low risk.   No ST deviation was noted.  LV perfusion is abnormal. There is no evidence of inducible ischemia. There is evidence of infarction.   Defect 1: There is a medium defect with mild reduction in uptake present in the apical to mid anterior and apex location(s) that is fixed. There is abnormal wall motion in the anteroapex. Consistent with infarction.   Nuclear stress EF: 63 %. The left ventricular ejection fraction is normal (55-65%). Left ventricular function is normal. End diastolic cavity size is normal.   Cardiac monitor 11/23/2020 personally reviewed  Episode of atrial fibrillation noted total burden less than 1% with heart rate between 70 and 157 with average of 101.  Longest episode 4 minutes 6 seconds.  TTE 03/02/2019  1. The left ventricle has normal systolic function with an ejection  fraction of 60-65%. The cavity size was normal. Left ventricular diastolic  function could not be evaluated due to nondiagnostic images. No evidence  of left ventricular regional wall  motion abnormalities.   2. The right ventricle has normal systolic function. The cavity was  normal. There is no increase in right ventricular wall  thickness.   3. The aortic valve is tricuspid. Mild sclerosis of the aortic valve.  Aortic valve regurgitation was not assessed by color flow Doppler.  ASSESSMENT AND PLAN:  1.  Paroxysmal atrial fibrillation: CHA2DS2-VASc of 3.  Has stopped her Xarelto, currently on metoprolol 50 mg daily.  Status post ablation 06/21/2021.  No further episodes of atrial fibrillation.  She is happy with how she is feeling.  No changes.  2.  Hypertension: well controlled  3.  Coronary artery disease: Status post multiple stents, most recently in 2020.  No current chest pain.   Current medicines are reviewed at length with the patient today.   The patient does not have concerns regarding her medicines.  The following changes were made today:  none  Labs/ tests ordered today include:  Orders Placed This Encounter  Procedures   EKG 12-Lead     Disposition:   FU with Jaquarious Grey 3 months  Signed, Damier Disano Meredith Leeds, MD  10/15/2021 3:25 PM     [facility] [address] [place] [address] [phone] (office) [phone] (fax)

## 2021-10-30 DIAGNOSIS — E785 Hyperlipidemia, unspecified: Secondary | ICD-10-CM | POA: Diagnosis not present

## 2021-10-30 DIAGNOSIS — E538 Deficiency of other specified B group vitamins: Secondary | ICD-10-CM | POA: Diagnosis not present

## 2021-10-30 DIAGNOSIS — R7989 Other specified abnormal findings of blood chemistry: Secondary | ICD-10-CM | POA: Diagnosis not present

## 2021-10-30 DIAGNOSIS — Z6832 Body mass index (BMI) 32.0-32.9, adult: Secondary | ICD-10-CM | POA: Diagnosis not present

## 2021-10-30 DIAGNOSIS — I4891 Unspecified atrial fibrillation: Secondary | ICD-10-CM | POA: Diagnosis not present

## 2021-10-30 DIAGNOSIS — I10 Essential (primary) hypertension: Secondary | ICD-10-CM | POA: Diagnosis not present

## 2021-10-30 DIAGNOSIS — R7303 Prediabetes: Secondary | ICD-10-CM | POA: Diagnosis not present

## 2021-10-30 DIAGNOSIS — F419 Anxiety disorder, unspecified: Secondary | ICD-10-CM | POA: Diagnosis not present

## 2021-10-30 DIAGNOSIS — E063 Autoimmune thyroiditis: Secondary | ICD-10-CM | POA: Diagnosis not present

## 2021-12-16 ENCOUNTER — Ambulatory Visit: Payer: Medicare HMO | Admitting: Cardiology

## 2021-12-17 DIAGNOSIS — H524 Presbyopia: Secondary | ICD-10-CM | POA: Diagnosis not present

## 2021-12-17 DIAGNOSIS — H5203 Hypermetropia, bilateral: Secondary | ICD-10-CM | POA: Diagnosis not present

## 2021-12-19 DIAGNOSIS — L300 Nummular dermatitis: Secondary | ICD-10-CM | POA: Diagnosis not present

## 2021-12-19 DIAGNOSIS — L304 Erythema intertrigo: Secondary | ICD-10-CM | POA: Diagnosis not present

## 2022-01-22 DIAGNOSIS — H6592 Unspecified nonsuppurative otitis media, left ear: Secondary | ICD-10-CM | POA: Diagnosis not present

## 2022-01-22 DIAGNOSIS — H6092 Unspecified otitis externa, left ear: Secondary | ICD-10-CM | POA: Diagnosis not present

## 2022-01-27 ENCOUNTER — Encounter: Payer: Self-pay | Admitting: Cardiology

## 2022-01-27 ENCOUNTER — Ambulatory Visit: Payer: Medicare HMO | Admitting: Cardiology

## 2022-01-27 VITALS — BP 154/76 | HR 61 | Ht 61.5 in | Wt 186.4 lb

## 2022-01-27 DIAGNOSIS — I480 Paroxysmal atrial fibrillation: Secondary | ICD-10-CM | POA: Diagnosis not present

## 2022-01-27 NOTE — Progress Notes (Signed)
Electrophysiology Office Note   Date:  01/27/2022   ID:  Fatimazahra, Irias 1950-11-03, MRN [id number]  PCP:  Earlyne Iba, NP  Cardiologist:  Agustin Cree Primary Electrophysiologist:  Constance Haw, MD    Chief Complaint: AF   History of Present Illness: Kaitlyn Jimenez is a 71 y.o. female who is being seen today for the evaluation of AF at the request of Potts, Georgeann Oppenheim, NP. Presenting today for electrophysiology evaluation.  She has a history significant for coronary artery disease status post multiple stents, most recently stenting of the RCA in 2020.  She presented to cardiology office with palpitations.  She wore a cardiac monitor that showed a 1% atrial fibrillation burden.  She is now status post ablation 06/21/2021.  Today, denies symptoms of palpitations, chest pain, shortness of breath, orthopnea, PND, lower extremity edema, claudication, dizziness, presyncope, syncope, bleeding, or neurologic sequela. The patient is tolerating medications without difficulties.  Since being seen she has done well.  She has noted no further episodes of atrial fibrillation.  She is overall quite happy with her control.   Past Medical History:  Diagnosis Date   Atypical chest pain 01/07/2019   Coronary artery disease involving native coronary artery of native heart with angina pectoris ([place]) 07/13/2017   Stenting in 2007, 2013 to LAD, last cardiac cath in 02/2019 w/ prox RCA stent and 50% ISR of LAD stent   Dyslipidemia 07/13/2017   Essential hypertension 07/13/2017   Gastroesophageal reflux disease without esophagitis 05/25/2020   Hypertension    MI (myocardial infarction) ([place])    MI (myocardial infarction) ([place])    Other hyperlipidemia 05/25/2020   Other specified hypothyroidism 05/25/2020   Primary hypertension 05/25/2020   Thyroid disease    Unstable angina ([place]) 03/01/2019   Past Surgical History:  Procedure Laterality Date   ABDOMINAL HYSTERECTOMY     ATRIAL FIBRILLATION  ABLATION N/A 06/21/2021   Procedure: ATRIAL FIBRILLATION ABLATION;  Surgeon: Constance Haw, MD;  Location: [facility];  Service: Cardiovascular;  Laterality: N/A;   CARDIAC CATHETERIZATION     CHOLECYSTECTOMY     CORONARY ANGIOPLASTY WITH STENT PLACEMENT     LEFT HEART CATH AND CORONARY ANGIOGRAPHY N/A 03/02/2019   Procedure: LEFT HEART CATH AND CORONARY ANGIOGRAPHY;  Surgeon: Sherren Mocha, MD;  Location: [facility];  Service: Cardiovascular;  Laterality: N/A;     Current Outpatient Medications  Medication Sig Dispense Refill   ALPRAZolam (XANAX) 1 MG tablet Take 1 mg by mouth 2 (two) times daily.     aspirin EC 81 MG tablet Take 1 tablet (81 mg total) by mouth daily. Swallow whole. 90 tablet 3   cyanocobalamin (,VITAMIN B-12,) 1000 MCG/ML injection Inject 1,000 mcg into the muscle every 30 (thirty) days.     EPINEPHrine 0.3 mg/0.3 mL IJ SOAJ injection Inject 0.3 mg into the muscle as needed for anaphylaxis.     famotidine (PEPCID) 20 MG tablet Take 20 mg by mouth daily as needed for heartburn or indigestion.     fluticasone (FLONASE) 50 MCG/ACT nasal spray Place 1 spray into both nostrils daily as needed for allergies.     levothyroxine (SYNTHROID) 88 MCG tablet Take 88 mcg by mouth daily before breakfast.     metoprolol tartrate (LOPRESSOR) 50 MG tablet Take 1 tablet (50 mg total) by mouth 2 (two) times daily. 60 tablet 3   mupirocin ointment (BACTROBAN) 2 % Apply 1 application topically 3 (three) times daily.  nitroGLYCERIN (NITROSTAT) 0.4 MG SL tablet Place 1 tablet (0.4 mg total) under the tongue every 5 (five) minutes as needed for chest pain. 25 tablet 2   polyvinyl alcohol (LIQUIFILM TEARS) 1.4 % ophthalmic solution Place 1 drop into both eyes as needed for dry eyes.     pravastatin (PRAVACHOL) 40 MG tablet Take 40 mg by mouth once a week.     sodium chloride (OCEAN) 0.65 % SOLN nasal spray Place 1 spray into both nostrils as needed for congestion.      triamcinolone ointment (KENALOG) 0.1 % Apply 1 application topically as needed (demititis).     valsartan (DIOVAN) 160 MG tablet Take 1 tablet (160 mg total) by mouth daily. 90 tablet 1   Vitamin D, Ergocalciferol, (DRISDOL) 1.25 MG (50000 UT) CAPS capsule Take 50,000 Units by mouth every 30 (thirty) days.     No current facility-administered medications for this visit.    Allergies:   Bee venom, Penciclovir, Shellfish-derived products, Sulfa antibiotics, Brilinta [ticagrelor], Eliquis [apixaban], Prasugrel, Iodinated contrast media, Penicillins, and Ramipril   Social History:  The patient  reports that she has quit smoking. She has never used smokeless tobacco. She reports that she does not drink alcohol and does not use drugs.   Family History:  The patient's family history includes Arrhythmia in her sister; Congestive Heart Failure in her brother; Heart attack in her brother and father; Heart disease in her maternal uncle, mother, and paternal uncle; Rheum arthritis in her brother.   ROS:  Please see the history of present illness.   Otherwise, review of systems is positive for none.   All other systems are reviewed and negative.   PHYSICAL EXAM: VS:  BP (!) 154/76   Pulse 61   Ht 5' 1.5" (1.562 m)   Wt 186 lb 6.4 oz (84.6 kg)   SpO2 99%   BMI 34.65 kg/m  , BMI Body mass index is 34.65 kg/m. GEN: Well nourished, well developed, in no acute distress  HEENT: normal  Neck: no JVD, carotid bruits, or masses Cardiac: RRR; no murmurs, rubs, or gallops,no edema  Respiratory:  clear to auscultation bilaterally, normal work of breathing GI: soft, nontender, nondistended, + BS MS: no deformity or atrophy  Skin: warm and dry Neuro:  Strength and sensation are intact Psych: euthymic mood, full affect  EKG:  EKG is ordered today. Personal review of the ekg ordered shows sinus rhythm, left bundle branch block, rate 61  Recent Labs: 06/06/2021: BUN 11; Creatinine, Ser 0.94; Hemoglobin  13.0; Platelets 307; Potassium 4.9; Sodium 134    Lipid Panel     Component Value Date/Time   CHOL 252 (H) 03/02/2019 0253   TRIG 48 03/02/2019 0253   HDL 56 03/02/2019 0253   CHOLHDL 4.5 03/02/2019 0253   VLDL 10 03/02/2019 0253   LDLCALC 186 (H) 03/02/2019 0253     Wt Readings from Last 3 Encounters:  01/27/22 186 lb 6.4 oz (84.6 kg)  10/15/21 187 lb 9.6 oz (85.1 kg)  09/02/21 186 lb (84.4 kg)      Other studies Reviewed: Additional studies/ records that were reviewed today include: Myoview 04/15/2021   Review of the above records today demonstrates:    Findings are consistent with prior myocardial infarction in LAD distribution. The study is overall low risk.   No ST deviation was noted.   LV perfusion is abnormal. There is no evidence of inducible ischemia. There is evidence of infarction.   Defect 1: There is a  medium defect with mild reduction in uptake present in the apical to mid anterior and apex location(s) that is fixed. There is abnormal wall motion in the anteroapex. Consistent with infarction.   Nuclear stress EF: 63 %. The left ventricular ejection fraction is normal (55-65%). Left ventricular function is normal. End diastolic cavity size is normal.   Cardiac monitor 11/23/2020 personally reviewed  Episode of atrial fibrillation noted total burden less than 1% with heart rate between 70 and 157 with average of 101.  Longest episode 4 minutes 6 seconds.  TTE 03/02/2019  1. The left ventricle has normal systolic function with an ejection  fraction of 60-65%. The cavity size was normal. Left ventricular diastolic  function could not be evaluated due to nondiagnostic images. No evidence  of left ventricular regional wall  motion abnormalities.   2. The right ventricle has normal systolic function. The cavity was  normal. There is no increase in right ventricular wall thickness.   3. The aortic valve is tricuspid. Mild sclerosis of the aortic valve.  Aortic valve  regurgitation was not assessed by color flow Doppler.  ASSESSMENT AND PLAN:  1.  Paroxysmal atrial fibrillation: CHA2DS2-VASc of 3.  Status post ablation 06/21/2021.  Patient stopped Xarelto.  No further episodes of atrial fibrillation.  Happy with her control.  No changes.  2.  Hypertension: Elevated today.  Usually well controlled.  Plan per primary cardiology  3.  Coronary artery disease: Status post multiple stents, most recently in 2020.  No current chest pain.   Current medicines are reviewed at length with the patient today.   The patient does not have concerns regarding her medicines.  The following changes were made today: None  Labs/ tests ordered today include:  Orders Placed This Encounter  Procedures   EKG 12-Lead     Disposition:   FU with Lorenza Shakir 12 months  Signed, Markesia Crilly Meredith Leeds, MD  01/27/2022 2:11 PM     [place] [address] [phone] (office) [phone] (fax)

## 2022-01-27 NOTE — Patient Instructions (Signed)
Medication Instructions:  Your physician recommends that you continue on your current medications as directed. Please refer to the Current Medication list given to you today.  *If you need a refill on your cardiac medications before your next appointment, please call your pharmacy*   Lab Work: None ordered   Testing/Procedures: None ordered   Follow-Up: At [facility], you and your health needs are our priority.  As part of our continuing mission to provide you with exceptional heart care, we have created designated Provider Care Teams.  These Care Teams include your primary Cardiologist (physician) and Advanced Practice Providers (APPs -  Physician Assistants and Nurse Practitioners) who all work together to provide you with the care you need, when you need it.  Your next appointment:   1 year(s)  The format for your next appointment:   In Person  Provider:   Will Camnitz, MD    Thank you for choosing [facility]!!   Ambyr Qadri, RN [phone]  Other Instructions   Important Information About Sugar           

## 2022-02-06 DIAGNOSIS — I10 Essential (primary) hypertension: Secondary | ICD-10-CM | POA: Diagnosis not present

## 2022-02-06 DIAGNOSIS — E785 Hyperlipidemia, unspecified: Secondary | ICD-10-CM | POA: Diagnosis not present

## 2022-02-06 DIAGNOSIS — E063 Autoimmune thyroiditis: Secondary | ICD-10-CM | POA: Diagnosis not present

## 2022-02-06 DIAGNOSIS — E538 Deficiency of other specified B group vitamins: Secondary | ICD-10-CM | POA: Diagnosis not present

## 2022-02-06 DIAGNOSIS — R7303 Prediabetes: Secondary | ICD-10-CM | POA: Diagnosis not present

## 2022-02-06 DIAGNOSIS — F419 Anxiety disorder, unspecified: Secondary | ICD-10-CM | POA: Diagnosis not present

## 2022-02-06 DIAGNOSIS — I4891 Unspecified atrial fibrillation: Secondary | ICD-10-CM | POA: Diagnosis not present

## 2022-02-06 DIAGNOSIS — R7989 Other specified abnormal findings of blood chemistry: Secondary | ICD-10-CM | POA: Diagnosis not present

## 2022-02-06 DIAGNOSIS — Z6832 Body mass index (BMI) 32.0-32.9, adult: Secondary | ICD-10-CM | POA: Diagnosis not present

## 2022-02-24 DIAGNOSIS — R0989 Other specified symptoms and signs involving the circulatory and respiratory systems: Secondary | ICD-10-CM | POA: Diagnosis not present

## 2022-02-24 DIAGNOSIS — H9202 Otalgia, left ear: Secondary | ICD-10-CM | POA: Diagnosis not present

## 2022-02-24 NOTE — Progress Notes (Signed)
 Otolaryngology Office Visit Note  Seen at the kind request of Potts, Therisa Sharps, FNP for evaluation of: Ear Symptoms  Assessment and Plan Problem List      Other   Left carotid bruit   Relevant Orders   CLIN VAS LAB-CAROTID DUPLEX (BILATERAL)   Referred otalgia of left ear - Primary   Current Assessment & Plan    Concern over left ear pain. She has been having intermittent left ear discomfort.  Was treated with oral antibiotic but continues with discomfort.  Some associated pulsatile tinnitus at the left ear. EXAM shows normal external canals and tympanic membranes bilaterally move well on insufflation.  Neck is markedly tender in the left digastric region.  Oral cavity oropharynx is unremarkable.  Audible bruit to left carotid artery. PLAN: Reassured her ear is fine.  Seems to be referred pain from musculoskeletal sources.  Discussed nonsteroidals, heat pad, stretching.  She does have an audible left carotid bruit.  We will set up Doppler to better evaluate.  Further plan based on those results.        Surgery To Schedule:  HPI Chief Complaint  Patient presents with  . 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  Pt has been treated with tobramycin and neomycin drops.  Pt states the neomycin helped some.  Pt denies any prior ear surgeries or hearing aids.  She states she has occasional tinnitus of Lt ear (swishing sound) that worsens when lying down.  Pt states the Lt ear feels clogged and itchy.    Allergies Allergies  Allergen Reactions  . Bee Venom Protein (Honey Bee) Anaphylaxis (ALLERGY)  . Shellfish Containing Products Anaphylaxis (ALLERGY)  . Epinephrine Other (See Comments)    Cannot take due to cardiac stint, Afib, and heart ablation.  . Brilinta  [Ticagrelor ] Rash (ALLERGY/intolerance)    SHOB, rash, increased BP and heart rate  . Eliquis  [Apixaban ] Rash (ALLERGY/intolerance)    Rash, SHOB, increased BP and heart rate   . Iv Dye [Iodinated Contrast Media] Rash (ALLERGY/intolerance)    SHOB, rash, increased heart rate with increased BP  . Penicillins Rash (ALLERGY/intolerance)    Rash, SHOB, increased heart rate and BP  . Sulfa (Sulfonamide Antibiotics) Rash (ALLERGY/intolerance)    Rash, increased heart rate and BP, SHOB    Current Meds  Current Outpatient Medications:  .  ALPRAZolam  (XANAX ) 1 MG tablet,  , Disp: , Rfl:  .  aspirin  81 MG EC tablet *ANTIPLATELET*, Take 1 tablet (81 mg total) by mouth., Disp: , Rfl:  .  cetirizine (ZYRTEC) 10 MG tablet, Take by mouth., Disp: , Rfl:  .  cyanocobalamin  (VITAMIN B-12) 1,000 mcg/mL injection, Inject 1 mL (1,000 mcg total) into the muscle., Disp: , Rfl:  .  EPINEPHrine (EPIPEN) 0.3 mg/0.3 mL auto-injector, Inject 0.3 mLs (0.3 mg total) into the muscle., Disp: , Rfl:  .  ergocalciferol (VITAMIN D2) 1,250 mcg (50,000 unit) capsule, Take 1 capsule (50,000 Units total) by mouth., Disp: , Rfl:  .  famotidine  (PEPCID ) 20 MG tablet, Take 1 tablet (20 mg total) by mouth., Disp: , Rfl:  .  metoPROLOL  tartrate (LOPRESSOR ) 50 MG tablet,  , Disp: , Rfl:  .  mupirocin (BACTROBAN) 2 % ointment, Apply topically., Disp: , Rfl:  .  neomycin-polymyxin-hydrocortisone  (CORTISPORIN) 3.5-10,000-1 mg/mL-unit/mL-% otic suspension,  , Disp: , Rfl:  .  pravastatin  (PRAVACHOL ) 40 MG tablet, Take 1 tablet (40 mg total) by  mouth., Disp: , Rfl:  .  rosuvastatin  (CRESTOR ) 5 MG tablet,  , Disp: , Rfl:  .  sodium chloride  (AYR) 0.65 % nasal drops, Administer 1 spray into affected nostril., Disp: , Rfl:  .  SYNTHROID  88 mcg tablet,  , Disp: , Rfl:  .  valsartan  (DIOVAN ) 160 MG tablet,  , Disp: , Rfl:   Active Problems Patient Active Problem List  Diagnosis  . Left carotid bruit  . Referred otalgia of left ear    PSH No past surgical history on file.  Family Hx No family history on file.  Social Hx Social History   Socioeconomic History  . Marital status: Married    Spouse  name: Not on file  . Number of children: Not on file  . Years of education: Not on file  . Highest education level: Not on file  Occupational History  . Not on file  Tobacco Use  . Smoking status: Former    Types: Cigarettes    Quit date: 1981    Years since quitting: 42.5  . Smokeless tobacco: Never  Substance and Sexual Activity  . Alcohol use: Never  . Drug use: Never  . Sexual activity: Not on file  Other Topics Concern  . Not on file  Social History Narrative  . Not on file   Social Determinants of Health   Financial Resource Strain: Not on file  Food Insecurity: Not on file  Transportation Needs: Not on file  Physical Activity: Not on file  Stress: Not on file  Social Connections: Not on file  Housing Stability: Not on file    Vital Signs Vitals:   02/24/22 1149  BP: 168/74  Pulse: 67  Temp: 98 F (36.7 C)  Height: 1.575 m (5' 2)  Weight: 84.7 kg (186 lb 12.8 oz)  BMI (Calculated): 34.2    Physical Exam   EXAMINATION  Constitutional General Appearance: Well appearing in no acute distress Ability to Communicate: Age appropriate, Voice clear without stridor Pain: None at this time  Head & Face Inspection:  Normocephalic  Eyes Ocular Motility: Extraocular muscles intact  Ears See Assessment and Plan   Nose Ext Nose: Dorsum midline Intranasal: No polyps or purulence  Oral Cavity, Mouth  Pharynx Lips/Teeth/Gums: No lesions, dentition age appropriate Oral Mucosa: Moist, pink Tongue/Floor of Mouth: Tongue: intact, full range of motion; Floor of mouth: no lesions Palate/Uvula: Intact, mobile Tonsils: Unremarkable Posterior Pharynx: Normal Pharyngeal Walls: Intact, mobile Larynx:  Nasopharynx:   Cervical Neck: No palpable masses Salivary Glands: No masses, tenderness or enlargement Thyroid : No palpable masses  Lymphatic Neck Nodes: No palpable nodes   Respiratory/Cardiac      Neuro/Psych Cranial Nerves:  Orientation: Age appropriate Mood &  Affect: Age appropriate  GI/GU   Skin No facial lesions  Musculoskeletal   Extremities    PROCEDURE:   Medical Decision Making Number of diagnoses: 00785(95) Acute with systemic conditions Amount and Complexity of Data: 00787(97) Sum of below <=1 Highest Risk: 00786(96) Low risk   Alm Ada, MD [phone]    Electronically signed by: Alm Edsel Ada Mickey., MD 02/24/22 1500

## 2022-04-08 ENCOUNTER — Ambulatory Visit: Payer: Medicare HMO | Admitting: Cardiology

## 2022-04-08 ENCOUNTER — Ambulatory Visit (INDEPENDENT_AMBULATORY_CARE_PROVIDER_SITE_OTHER): Payer: Medicare HMO

## 2022-04-08 ENCOUNTER — Encounter: Payer: Self-pay | Admitting: Cardiology

## 2022-04-08 VITALS — BP 148/70 | HR 75 | Ht 61.0 in | Wt 188.0 lb

## 2022-04-08 DIAGNOSIS — Z8679 Personal history of other diseases of the circulatory system: Secondary | ICD-10-CM

## 2022-04-08 DIAGNOSIS — R0609 Other forms of dyspnea: Secondary | ICD-10-CM | POA: Diagnosis not present

## 2022-04-08 DIAGNOSIS — Z9889 Other specified postprocedural states: Secondary | ICD-10-CM

## 2022-04-08 DIAGNOSIS — E785 Hyperlipidemia, unspecified: Secondary | ICD-10-CM | POA: Diagnosis not present

## 2022-04-08 DIAGNOSIS — R0989 Other specified symptoms and signs involving the circulatory and respiratory systems: Secondary | ICD-10-CM

## 2022-04-08 DIAGNOSIS — I10 Essential (primary) hypertension: Secondary | ICD-10-CM | POA: Diagnosis not present

## 2022-04-08 DIAGNOSIS — R002 Palpitations: Secondary | ICD-10-CM

## 2022-04-08 DIAGNOSIS — I25119 Atherosclerotic heart disease of native coronary artery with unspecified angina pectoris: Secondary | ICD-10-CM | POA: Diagnosis not present

## 2022-04-08 MED ORDER — ROSUVASTATIN CALCIUM 10 MG PO TABS: 10.0000 mg | ORAL_TABLET | Freq: Every day | ORAL | 3 refills | Status: DC

## 2022-04-08 NOTE — Progress Notes (Signed)
Cardiology Office Note:    Date:  04/08/2022   ID:  Kaitlyn Jimenez, DOB Dec 25, 1950, MRN [id number]  PCP:  Jim Like, NP  Cardiologist:  Gypsy Balsam, MD    Referring MD: Jim Like, NP   No chief complaint on file.   History of Present Illness:    Kaitlyn Jimenez is a 71 y.o. female   with past medical history significant for coronary artery disease, she did have a PTCA and stenting done in 2007, 2013 and recently stenting of the right coronary artery done in December 2020.  She has been put appropriately for dual antiplatelet therapy, she did have some difficulty taking Brilinta eventually she ended up being on Plavix Recently she started complaining of having palpitations, she wore a monitor which showed evidence of atrial fibrillation.  Total burden was less than 1% rate was between 70 and 157 with an average rate of 101, longest episode 4 minutes 6 seconds.  Since that time she has been put on anticoagulation.  Eventually she was referred to our EP colleagues for atrial fibrillation ablation which was performed in October 2022.  Since that time she seems to be doing well.  Denies have any palpitations previously she was anticoagulated but anticoagulation has been withdrawn.  She is coming today to my office for follow-up.  Overall she is doing fine she described to have rare short acting episode of palpitations which feels different compared to atrial fibrillation her before.  Another issue is the fact that she heard a lot of heart beating in her left ear and ENT doctor told her that she does have problem with her cardiac artery and she would like to have it checked.  Denies have any chest pain tightness squeezing pressure burning chest.  Past Medical History:  Diagnosis Date   Atypical chest pain 01/07/2019   Coronary artery disease involving native coronary artery of native heart with angina pectoris (HCC) 07/13/2017   Stenting in 2007, 2013 to LAD, last cardiac cath in  02/2019 w/ prox RCA stent and 50% ISR of LAD stent   Dyslipidemia 07/13/2017   Essential hypertension 07/13/2017   Gastroesophageal reflux disease without esophagitis 05/25/2020   Hypertension    MI (myocardial infarction) (HCC)    MI (myocardial infarction) (HCC)    Other hyperlipidemia 05/25/2020   Other specified hypothyroidism 05/25/2020   Primary hypertension 05/25/2020   Thyroid disease    Unstable angina (HCC) 03/01/2019    Past Surgical History:  Procedure Laterality Date   ABDOMINAL HYSTERECTOMY     ATRIAL FIBRILLATION ABLATION N/A 06/21/2021   Procedure: ATRIAL FIBRILLATION ABLATION;  Surgeon: Regan Lemming, MD;  Location: [facility] INVASIVE CV LAB;  Service: Cardiovascular;  Laterality: N/A;   CARDIAC CATHETERIZATION     CHOLECYSTECTOMY     CORONARY ANGIOPLASTY WITH STENT PLACEMENT     LEFT HEART CATH AND CORONARY ANGIOGRAPHY N/A 03/02/2019   Procedure: LEFT HEART CATH AND CORONARY ANGIOGRAPHY;  Surgeon: Tonny Bollman, MD;  Location[facility] INVASIVE CV LAB;  Service: Cardiovascular;  Laterality: N/A;    Current Medications: Current Meds  Medication Sig   ALPRAZolam (XANAX) 1 MG tablet Take 1 mg by mouth 2 (two) times daily.   aspirin EC 81 MG tablet Take 1 tablet (81 mg total) by mouth daily. Swallow whole.   cyanocobalamin (,VITAMIN B-12,) 1000 MCG/ML injection Inject 1,000 mcg into the muscle every 30 (thirty) days.   EPINEPHrine 0.3 mg/0.3 mL IJ SOAJ injection Inject 0.3 mg into the  muscle as needed for anaphylaxis.   famotidine (PEPCID) 20 MG tablet Take 20 mg by mouth daily as needed for heartburn or indigestion.   fluticasone (FLONASE) 50 MCG/ACT nasal spray Place 1 spray into both nostrils daily as needed for allergies.   levothyroxine (SYNTHROID) 88 MCG tablet Take 88 mcg by mouth daily before breakfast.   metoprolol tartrate (LOPRESSOR) 50 MG tablet Take 1 tablet (50 mg total) by mouth 2 (two) times daily.   mupirocin ointment (BACTROBAN) 2 % Apply 1 application  topically 3 (three) times daily.   nitroGLYCERIN (NITROSTAT) 0.4 MG SL tablet Place 1 tablet (0.4 mg total) under the tongue every 5 (five) minutes as needed for chest pain.   polyvinyl alcohol (LIQUIFILM TEARS) 1.4 % ophthalmic solution Place 1 drop into both eyes as needed for dry eyes.   rosuvastatin (CRESTOR) 5 MG tablet Take 5 mg by mouth daily.   sodium chloride (OCEAN) 0.65 % SOLN nasal spray Place 1 spray into both nostrils as needed for congestion.   triamcinolone ointment (KENALOG) 0.1 % Apply 1 application topically as needed (demititis).   valsartan (DIOVAN) 160 MG tablet Take 1 tablet (160 mg total) by mouth daily.   Vitamin D, Ergocalciferol, (DRISDOL) 1.25 MG (50000 UT) CAPS capsule Take 50,000 Units by mouth every 30 (thirty) days.   [DISCONTINUED] pravastatin (PRAVACHOL) 40 MG tablet Take 40 mg by mouth once a week.     Allergies:   Bee venom, Penciclovir, Shellfish-derived products, Sulfa antibiotics, Brilinta [ticagrelor], Eliquis [apixaban], Prasugrel, Iodinated contrast media, Penicillins, and Ramipril   Social History   Socioeconomic History   Marital status: Married    Spouse name: Not on file   Number of children: Not on file   Years of education: Not on file   Highest education level: Not on file  Occupational History   Not on file  Tobacco Use   Smoking status: Former   Smokeless tobacco: Never  Vaping Use   Vaping Use: Never used  Substance and Sexual Activity   Alcohol use: No   Drug use: No   Sexual activity: Not on file  Other Topics Concern   Not on file  Social History Narrative   Not on file   Social Determinants of Health   Financial Resource Strain: Not on file  Food Insecurity: Not on file  Transportation Needs: Not on file  Physical Activity: Not on file  Stress: Not on file  Social Connections: Not on file     Family History: The patient's family history includes Arrhythmia in her sister; Congestive Heart Failure in her brother;  Heart attack in her brother and father; Heart disease in her maternal uncle, mother, and paternal uncle; Rheum arthritis in her brother. ROS:   Please see the history of present illness.    All 14 point review of systems negative except as described per history of present illness  EKGs/Labs/Other Studies Reviewed:      Recent Labs: 06/06/2021: BUN 11; Creatinine, Ser 0.94; Hemoglobin 13.0; Platelets 307; Potassium 4.9; Sodium 134  Recent Lipid Panel    Component Value Date/Time   CHOL 252 (H) 03/02/2019 0253   TRIG 48 03/02/2019 0253   HDL 56 03/02/2019 0253   CHOLHDL 4.5 03/02/2019 0253   VLDL 10 03/02/2019 0253   LDLCALC 186 (H) 03/02/2019 0253    Physical Exam:    VS:  BP (!) 148/70 (BP Location: Left Arm, Patient Position: Sitting)   Pulse 75   Ht 5\' 1"  (1.549 m)  Wt 188 lb (85.3 kg)   SpO2 95%   BMI 35.52 kg/m     Wt Readings from Last 3 Encounters:  04/08/22 188 lb (85.3 kg)  01/27/22 186 lb 6.4 oz (84.6 kg)  10/15/21 187 lb 9.6 oz (85.1 kg)     GEN:  Well nourished, well developed in no acute distress HEENT: Normal NECK: No JVD; No carotid bruits LYMPHATICS: No lymphadenopathy CARDIAC: RRR, no murmurs, no rubs, no gallops RESPIRATORY:  Clear to auscultation without rales, wheezing or rhonchi  ABDOMEN: Soft, non-tender, non-distended MUSCULOSKELETAL:  No edema; No deformity  SKIN: Warm and dry LOWER EXTREMITIES: no swelling NEUROLOGIC:  Alert and oriented x 3 PSYCHIATRIC:  Normal affect   ASSESSMENT:    1. Coronary artery disease involving native coronary artery of native heart with angina pectoris (HCC)   2. Essential hypertension   3. Dyslipidemia   4. Left carotid bruit   5. Status post ablation of atrial fibrillation    PLAN:    In order of problems listed above:  Coronary disease stable from that point review we will continue present management.  Essential hypertension blood pressure elevated but she always check it at home is good.  We will  continue present management. Dyslipidemia I did review her K PN which only her LDL of 136 HDL 58.  She still takes only 5 mg of Crestor I suggest increase to 10 mg daily Left carotic bruit.  I will schedule her to have carotic ultrasound Status post ablation of atrial fibrillation does have some palpitations I will ask her to have echocardiogram to assess left ventricle ejection fraction left atrial size, she also will wear Zio patch for 2 weeks   Medication Adjustments/Labs and Tests Ordered: Current medicines are reviewed at length with the patient today.  Concerns regarding medicines are outlined above.  No orders of the defined types were placed in this encounter.  Medication changes: No orders of the defined types were placed in this encounter.   Signed, Georgeanna Lea, MD, [facility] 04/08/2022 3:21 PM    [facility]

## 2022-04-08 NOTE — Addendum Note (Signed)
Addended by: Baldo Ash D on: 04/08/2022 03:57 PM   Modules accepted: Orders

## 2022-04-08 NOTE — Patient Instructions (Signed)
Medication Instructions:  Your physician has recommended you make the following change in your medication:   INCREASE Crestor to 10mg  1 tablet daily    Lab Work: None Ordered If you have labs (blood work) drawn today and your tests are completely normal, you will receive your results only by: MyChart Message (if you have MyChart) OR A paper copy in the mail If you have any lab test that is abnormal or we need to change your treatment, we will call you to review the results.   Testing/Procedures: Your physician has requested that you have an echocardiogram. Echocardiography is a painless test that uses sound waves to create images of your heart. It provides your doctor with information about the size and shape of your heart and how well your heart's chambers and valves are working. This procedure takes approximately one hour. There are no restrictions for this procedure.    WHY IS MY DOCTOR PRESCRIBING ZIO? The Zio system is proven and trusted by physicians to detect and diagnose irregular heart rhythms -- and has been prescribed to hundreds of thousands of patients.  The FDA has cleared the Zio system to monitor for many different kinds of irregular heart rhythms. In a study, physicians were able to reach a diagnosis 90% of the time with the Zio system1.  You can wear the Zio monitor -- a small, discreet, comfortable patch -- during your normal day-to-day activity, including while you sleep, shower, and exercise, while it records every single heartbeat for analysis.  1Barrett, P., et al. Comparison of 24 Hour Holter Monitoring Versus 14 Day Novel Adhesive Patch Electrocardiographic Monitoring. American Journal of Medicine, 2014.  ZIO VS. HOLTER MONITORING The Zio monitor can be comfortably worn for up to 14 days. Holter monitors can be worn for 24 to 48 hours, limiting the time to record any irregular heart rhythms you may have. Zio is able to capture data for the 51% of patients who  have their first symptom-triggered arrhythmia after 48 hours.1  LIVE WITHOUT RESTRICTIONS The Zio ambulatory cardiac monitor is a small, unobtrusive, and water-resistant patch--you might even forget you're wearing it. The Zio monitor records and stores every beat of your heart, whether you're sleeping, working out, or showering.    Your physician has requested that you have a carotid duplex. This test is an ultrasound of the carotid arteries in your neck. It looks at blood flow through these arteries that supply the brain with blood. Allow one hour for this exam. There are no restrictions or special instructions.   Follow-Up: At [facility], you and your health needs are our priority.  As part of our continuing mission to provide you with exceptional heart care, we have created designated Provider Care Teams.  These Care Teams include your primary Cardiologist (physician) and Advanced Practice Providers (APPs -  Physician Assistants and Nurse Practitioners) who all work together to provide you with the care you need, when you need it.  We recommend signing up for the patient portal called "MyChart".  Sign up information is provided on this After Visit Summary.  MyChart is used to connect with patients for Virtual Visits (Telemedicine).  Patients are able to view lab/test results, encounter notes, upcoming appointments, etc.  Non-urgent messages can be sent to your provider as well.   To learn more about what you can do with MyChart, go to [facility].    Your next appointment:   6 month(s)  The format for your next appointment:   In  Person  Provider:   Gypsy Balsam, MD    Other Instructions NA

## 2022-04-17 ENCOUNTER — Ambulatory Visit (INDEPENDENT_AMBULATORY_CARE_PROVIDER_SITE_OTHER): Payer: Medicare HMO

## 2022-04-17 DIAGNOSIS — R0989 Other specified symptoms and signs involving the circulatory and respiratory systems: Secondary | ICD-10-CM

## 2022-04-17 DIAGNOSIS — R0609 Other forms of dyspnea: Secondary | ICD-10-CM | POA: Diagnosis not present

## 2022-04-17 LAB — ECHOCARDIOGRAM COMPLETE
Area-P 1/2: 4.01 cm2
Calc EF: 51.3 %
S' Lateral: 2.8 cm
Single Plane A2C EF: 49.1 %
Single Plane A4C EF: 50.9 %

## 2022-04-22 ENCOUNTER — Telehealth: Payer: Self-pay

## 2022-04-22 NOTE — Telephone Encounter (Signed)
Results reviewed with pt as per Dr. Krasowski's note.  Pt verbalized understanding and had no additional questions. Routed to PCP  

## 2022-04-22 NOTE — Telephone Encounter (Signed)
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

## 2022-04-25 DIAGNOSIS — Z8679 Personal history of other diseases of the circulatory system: Secondary | ICD-10-CM | POA: Diagnosis not present

## 2022-04-25 DIAGNOSIS — Z9889 Other specified postprocedural states: Secondary | ICD-10-CM | POA: Diagnosis not present

## 2022-05-04 DIAGNOSIS — H6523 Chronic serous otitis media, bilateral: Secondary | ICD-10-CM | POA: Diagnosis not present

## 2022-05-21 ENCOUNTER — Telehealth: Payer: Self-pay | Admitting: Cardiology

## 2022-05-21 NOTE — Telephone Encounter (Signed)
Results reviewed with pt as per Dr. Krasowski's note.  Pt verbalized understanding and had no additional questions. Routed to PCP  

## 2022-05-21 NOTE — Telephone Encounter (Signed)
Patient is returning call to discuss monitor results. 

## 2022-06-13 ENCOUNTER — Ambulatory Visit: Payer: Medicare HMO | Attending: Cardiology | Admitting: Cardiology

## 2022-06-13 ENCOUNTER — Encounter: Payer: Self-pay | Admitting: Cardiology

## 2022-06-13 VITALS — BP 168/84 | HR 74 | Ht 61.0 in | Wt 188.0 lb

## 2022-06-13 DIAGNOSIS — I10 Essential (primary) hypertension: Secondary | ICD-10-CM

## 2022-06-13 DIAGNOSIS — E7849 Other hyperlipidemia: Secondary | ICD-10-CM

## 2022-06-13 DIAGNOSIS — Z8679 Personal history of other diseases of the circulatory system: Secondary | ICD-10-CM

## 2022-06-13 DIAGNOSIS — H7290 Unspecified perforation of tympanic membrane, unspecified ear: Secondary | ICD-10-CM

## 2022-06-13 DIAGNOSIS — I25119 Atherosclerotic heart disease of native coronary artery with unspecified angina pectoris: Secondary | ICD-10-CM

## 2022-06-13 DIAGNOSIS — E785 Hyperlipidemia, unspecified: Secondary | ICD-10-CM | POA: Diagnosis not present

## 2022-06-13 DIAGNOSIS — Z9889 Other specified postprocedural states: Secondary | ICD-10-CM | POA: Diagnosis not present

## 2022-06-13 DIAGNOSIS — I480 Paroxysmal atrial fibrillation: Secondary | ICD-10-CM | POA: Diagnosis not present

## 2022-06-13 NOTE — Patient Instructions (Addendum)
Medication Instructions:  Your physician recommends that you continue on your current medications as directed. Please refer to the Current Medication list given to you today.  *If you need a refill on your cardiac medications before your next appointment, please call your pharmacy*   Lab Work: Lipid - today If you have labs (blood work) drawn today and your tests are completely normal, you will receive your results only by: [place] (if you have MyChart) OR A paper copy in the mail If you have any lab test that is abnormal or we need to change your treatment, we will call you to review the results.   Testing/Procedures:    Follow-Up: At [facility], you and your health needs are our priority.  As part of our continuing mission to provide you with exceptional heart care, we have created designated Provider Care Teams.  These Care Teams include your primary Cardiologist (physician) and Advanced Practice Providers (APPs -  Physician Assistants and Nurse Practitioners) who all work together to provide you with the care you need, when you need it.  We recommend signing up for the patient portal called "MyChart".  Sign up information is provided on this After Visit Summary.  MyChart is used to connect with patients for Virtual Visits (Telemedicine).  Patients are able to view lab/test results, encounter notes, upcoming appointments, etc.  Non-urgent messages can be sent to your provider as well.   To learn more about what you can do with MyChart, go to [email or web address].    Your next appointment:   6 month(s)  The format for your next appointment:   In Person  Provider:   Jenne Campus, MD    Other Instructions Referral to ENT

## 2022-06-13 NOTE — Progress Notes (Signed)
Cardiology Office Note:    Date:  06/13/2022   ID:  Kaitlyn Jimenez, DOB 07/23/1951, MRN [id number]  PCP:  Jim Like, NP  Cardiologist:  Gypsy Balsam, MD    Referring MD: Jim Like, NP   Chief Complaint  Patient presents with   Follow-up  Doing well  History of Present Illness:    Kaitlyn Jimenez is a 71 y.o. female   with past medical history significant for coronary artery disease, she did have a PTCA and stenting done in 2007, 2013 and recently stenting of the right coronary artery done in December 2020.  She has been put appropriately for dual antiplatelet therapy, she did have some difficulty taking Brilinta eventually she ended up being on Plavix Recently she started complaining of having palpitations, she wore a monitor which showed evidence of atrial fibrillation.  Total burden was less than 1% rate was between 70 and 157 with an average rate of 101, longest episode 4 minutes 6 seconds.  Since that time she has been put on anticoagulation.  Eventually she was referred to our EP colleagues for atrial fibrillation ablation which was performed in October 2022.  Since that time she seems to be doing well.  Denies have any palpitations previously she was anticoagulated but anticoagulation has been withdrawn.  She comes today to my office for follow-up cardiac wise doing well.  Denies have any chest pain tightness squeezing pressure burning chest no palpitation dizziness swelling of lower extremities.  Complaint however is the fact that she can hear her heartbeat in the left ear especially when she lays down at night.  She went to ENT doctor however nothing came out of that.  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  However there is no recent TIA or CVA.  Past Medical History:  Diagnosis Date   Atypical chest pain 01/07/2019   Coronary artery disease involving native coronary artery of native heart with angina pectoris  (HCC) 07/13/2017   Stenting in 2007, 2013 to LAD, last cardiac cath in 02/2019 w/ prox RCA stent and 50% ISR of LAD stent   Dyslipidemia 07/13/2017   Essential hypertension 07/13/2017   Gastroesophageal reflux disease without esophagitis 05/25/2020   Hypertension    MI (myocardial infarction) (HCC)    MI (myocardial infarction) (HCC)    Other hyperlipidemia 05/25/2020   Other specified hypothyroidism 05/25/2020   Primary hypertension 05/25/2020   Thyroid disease    Unstable angina (HCC) 03/01/2019    Past Surgical History:  Procedure Laterality Date   ABDOMINAL HYSTERECTOMY     ATRIAL FIBRILLATION ABLATION N/A 06/21/2021   Procedure: ATRIAL FIBRILLATION ABLATION;  Surgeon: Regan Lemming, MD;  Location: [facility] INVASIVE CV LAB;  Service: Cardiovascular;  Laterality: N/A;   CARDIAC CATHETERIZATION     CHOLECYSTECTOMY     CORONARY ANGIOPLASTY WITH STENT PLACEMENT     LEFT HEART CATH AND CORONARY ANGIOGRAPHY N/A 03/02/2019   Procedure: LEFT HEART CATH AND CORONARY ANGIOGRAPHY;  Surgeon: Tonny Bollman, MD;  Location: [facility] INVASIVE CV LAB;  Service: Cardiovascular;  Laterality: N/A;    Current Medications: Current Meds  Medication Sig   ALPRAZolam (XANAX) 1 MG tablet Take 1 mg by mouth 2 (two) times daily.   aspirin EC 81 MG tablet Take 1 tablet (81 mg total) by mouth daily. Swallow whole.   cyanocobalamin (,VITAMIN B-12,) 1000 MCG/ML injection Inject 1,000 mcg into the muscle every 30 (thirty) days.  EPINEPHrine 0.3 mg/0.3 mL IJ SOAJ injection Inject 0.3 mg into the muscle as needed for anaphylaxis.   famotidine (PEPCID) 20 MG tablet Take 20 mg by mouth daily as needed for heartburn or indigestion.   fluticasone (FLONASE) 50 MCG/ACT nasal spray Place 1 spray into both nostrils daily as needed for allergies.   levothyroxine (SYNTHROID) 88 MCG tablet Take 88 mcg by mouth daily before breakfast.   metoprolol tartrate (LOPRESSOR) 50 MG tablet Take 1 tablet (50 mg total) by mouth 2 (two) times  daily.   mupirocin ointment (BACTROBAN) 2 % Apply 1 application topically 3 (three) times daily.   nitroGLYCERIN (NITROSTAT) 0.4 MG SL tablet Place 1 tablet (0.4 mg total) under the tongue every 5 (five) minutes as needed for chest pain.   polyvinyl alcohol (LIQUIFILM TEARS) 1.4 % ophthalmic solution Place 1 drop into both eyes as needed for dry eyes.   rosuvastatin (CRESTOR) 10 MG tablet Take 1 tablet (10 mg total) by mouth daily.   sodium chloride (OCEAN) 0.65 % SOLN nasal spray Place 1 spray into both nostrils as needed for congestion.   triamcinolone ointment (KENALOG) 0.1 % Apply 1 application topically as needed (demititis).   valsartan (DIOVAN) 160 MG tablet Take 1 tablet (160 mg total) by mouth daily.   Vitamin D, Ergocalciferol, (DRISDOL) 1.25 MG (50000 UT) CAPS capsule Take 50,000 Units by mouth every 30 (thirty) days.     Allergies:   Bee venom, Penciclovir, Shellfish-derived products, Sulfa antibiotics, Brilinta [ticagrelor], Eliquis [apixaban], Prasugrel, Iodinated contrast media, Penicillins, and Ramipril   Social History   Socioeconomic History   Marital status: Married    Spouse name: Not on file   Number of children: Not on file   Years of education: Not on file   Highest education level: Not on file  Occupational History   Not on file  Tobacco Use   Smoking status: Former   Smokeless tobacco: Never  Vaping Use   Vaping Use: Never used  Substance and Sexual Activity   Alcohol use: No   Drug use: No   Sexual activity: Not on file  Other Topics Concern   Not on file  Social History Narrative   Not on file   Social Determinants of Health   Financial Resource Strain: Not on file  Food Insecurity: Not on file  Transportation Needs: Not on file  Physical Activity: Not on file  Stress: Not on file  Social Connections: Not on file     Family History: The patient's family history includes Arrhythmia in her sister; Congestive Heart Failure in her brother; Heart  attack in her brother and father; Heart disease in her maternal uncle, mother, and paternal uncle; Rheum arthritis in her brother. ROS:   Please see the history of present illness.    All 14 point review of systems negative except as described per history of present illness  EKGs/Labs/Other Studies Reviewed:      Recent Labs: No results found for requested labs within last 365 days.  Recent Lipid Panel    Component Value Date/Time   CHOL 252 (H) 03/02/2019 0253   TRIG 48 03/02/2019 0253   HDL 56 03/02/2019 0253   CHOLHDL 4.5 03/02/2019 0253   VLDL 10 03/02/2019 0253   LDLCALC 186 (H) 03/02/2019 0253    Physical Exam:    VS:  BP (!) 168/84 (BP Location: Left Arm, Patient Position: Sitting)   Pulse 74   Ht 5\' 1"  (1.549 m)   Wt 188 lb (  85.3 kg)   SpO2 97%   BMI 35.52 kg/m     Wt Readings from Last 3 Encounters:  06/13/22 188 lb (85.3 kg)  04/08/22 188 lb (85.3 kg)  01/27/22 186 lb 6.4 oz (84.6 kg)     GEN:  Well nourished, well developed in no acute distress HEENT: Normal NECK: No JVD; No carotid bruits LYMPHATICS: No lymphadenopathy CARDIAC: RRR, no murmurs, no rubs, no gallops RESPIRATORY:  Clear to auscultation without rales, wheezing or rhonchi  ABDOMEN: Soft, non-tender, non-distended MUSCULOSKELETAL:  No edema; No deformity  SKIN: Warm and dry LOWER EXTREMITIES: no swelling NEUROLOGIC:  Alert and oriented x 3 PSYCHIATRIC:  Normal affect   ASSESSMENT:    1. Perforation of tympanic membrane, unspecified laterality   2. Dyslipidemia   3. Paroxysmal atrial fibrillation (HCC)   4. Coronary artery disease involving native coronary artery of native heart with angina pectoris (HCC)   5. Essential hypertension   6. Status post ablation of atrial fibrillation   7. Other hyperlipidemia   8. Primary hypertension    PLAN:    In order of problems listed above:  Paroxysmal atrial fibrillation.  She is not anticoagulated she does not want to be anticoagulated she  says she feels absolutely miserable anticoagulation she does not want to have it.  She did have atrial fibrillation ablation done it looks like does not have much atrial fibrillation anymore. Essential hypertension still uncontrolled however she tells me at home always good she does not want to add any medication to her medical regimen. Carotic arterial disease.  She is anticoagulated which I will continue she is also on statin Crestor 10.  Previously she was taking 5 I was able to convince her to increase to 10 she agreed to have fasting lipid profile done but she told me straight that she will not increase any medications for it. Dyslipidemia I did review K PN from summer of this year her LDL 136 HDL 58.  She is on 10 mg of Crestor which I will continue   Medication Adjustments/Labs and Tests Ordered: Current medicines are reviewed at length with the patient today.  Concerns regarding medicines are outlined above.  Orders Placed This Encounter  Procedures   Lipid panel   Ambulatory referral to ENT   Medication changes: No orders of the defined types were placed in this encounter.   Signed, Georgeanna Lea, MD, [facility] 06/13/2022 5:09 PM    [facility]

## 2022-06-14 LAB — LIPID PANEL
Chol/HDL Ratio: 3.7 ratio (ref 0.0–4.4)
Cholesterol, Total: 216 mg/dL — ABNORMAL HIGH (ref 100–199)
HDL: 59 mg/dL (ref >39)
LDL Chol Calc (NIH): 140 mg/dL — ABNORMAL HIGH (ref 0–99)
Triglycerides: 93 mg/dL (ref 0–149)
VLDL Cholesterol Cal: 17 mg/dL (ref 5–40)

## 2022-06-19 ENCOUNTER — Telehealth: Payer: Self-pay

## 2022-06-19 NOTE — Telephone Encounter (Signed)
-----   Message from Park Liter, MD sent at 06/18/2022  9:49 AM EDT ----- Cholesterol still unacceptably high.  I would recommend to increase dose of Crestor to 20 mg daily.  If she accepts fasting lipid profile, AST LT 6 weeks

## 2022-06-19 NOTE — Telephone Encounter (Signed)
Patient notified of results and declined recommendations. She said she will try diet and plan to cholesterol supplement to help reduce her level. She also will be seeing her PCP in December will repeat labs then. Message sent to Dr. Raliegh Ip

## 2022-07-31 DIAGNOSIS — I4891 Unspecified atrial fibrillation: Secondary | ICD-10-CM | POA: Diagnosis not present

## 2022-07-31 DIAGNOSIS — Z79899 Other long term (current) drug therapy: Secondary | ICD-10-CM | POA: Diagnosis not present

## 2022-07-31 DIAGNOSIS — E063 Autoimmune thyroiditis: Secondary | ICD-10-CM | POA: Diagnosis not present

## 2022-07-31 DIAGNOSIS — I10 Essential (primary) hypertension: Secondary | ICD-10-CM | POA: Diagnosis not present

## 2022-07-31 DIAGNOSIS — Z139 Encounter for screening, unspecified: Secondary | ICD-10-CM | POA: Diagnosis not present

## 2022-07-31 DIAGNOSIS — J302 Other seasonal allergic rhinitis: Secondary | ICD-10-CM | POA: Diagnosis not present

## 2022-07-31 DIAGNOSIS — E538 Deficiency of other specified B group vitamins: Secondary | ICD-10-CM | POA: Diagnosis not present

## 2022-07-31 DIAGNOSIS — R7989 Other specified abnormal findings of blood chemistry: Secondary | ICD-10-CM | POA: Diagnosis not present

## 2022-07-31 DIAGNOSIS — Z9181 History of falling: Secondary | ICD-10-CM | POA: Diagnosis not present

## 2022-07-31 DIAGNOSIS — Z1331 Encounter for screening for depression: Secondary | ICD-10-CM | POA: Diagnosis not present

## 2022-07-31 DIAGNOSIS — R7303 Prediabetes: Secondary | ICD-10-CM | POA: Diagnosis not present

## 2022-07-31 DIAGNOSIS — E785 Hyperlipidemia, unspecified: Secondary | ICD-10-CM | POA: Diagnosis not present

## 2022-10-09 ENCOUNTER — Ambulatory Visit: Payer: Medicare HMO | Admitting: Cardiology

## 2022-11-03 DIAGNOSIS — E063 Autoimmune thyroiditis: Secondary | ICD-10-CM | POA: Diagnosis not present

## 2022-11-03 DIAGNOSIS — F419 Anxiety disorder, unspecified: Secondary | ICD-10-CM | POA: Diagnosis not present

## 2022-11-03 DIAGNOSIS — E538 Deficiency of other specified B group vitamins: Secondary | ICD-10-CM | POA: Diagnosis not present

## 2022-11-03 DIAGNOSIS — Z79899 Other long term (current) drug therapy: Secondary | ICD-10-CM | POA: Diagnosis not present

## 2022-11-03 DIAGNOSIS — R7303 Prediabetes: Secondary | ICD-10-CM | POA: Diagnosis not present

## 2022-11-03 DIAGNOSIS — I4891 Unspecified atrial fibrillation: Secondary | ICD-10-CM | POA: Diagnosis not present

## 2022-11-03 DIAGNOSIS — Z6832 Body mass index (BMI) 32.0-32.9, adult: Secondary | ICD-10-CM | POA: Diagnosis not present

## 2022-11-03 DIAGNOSIS — I10 Essential (primary) hypertension: Secondary | ICD-10-CM | POA: Diagnosis not present

## 2022-11-03 DIAGNOSIS — R7989 Other specified abnormal findings of blood chemistry: Secondary | ICD-10-CM | POA: Diagnosis not present

## 2022-11-03 DIAGNOSIS — E785 Hyperlipidemia, unspecified: Secondary | ICD-10-CM | POA: Diagnosis not present

## 2022-11-25 DIAGNOSIS — L719 Rosacea, unspecified: Secondary | ICD-10-CM | POA: Diagnosis not present

## 2022-11-25 DIAGNOSIS — Z6833 Body mass index (BMI) 33.0-33.9, adult: Secondary | ICD-10-CM | POA: Diagnosis not present

## 2022-11-25 DIAGNOSIS — J302 Other seasonal allergic rhinitis: Secondary | ICD-10-CM | POA: Diagnosis not present

## 2022-12-02 ENCOUNTER — Telehealth: Payer: Self-pay

## 2022-12-02 NOTE — Patient Outreach (Signed)
  Care Coordination   12/02/2022 Name: Kaitlyn Jimenez MRN: [id number] DOB: 08/16/1951   Care Coordination Outreach Attempts:  An unsuccessful telephone outreach was attempted today to offer the patient information about available care coordination services as a benefit of their health plan.  Placed call to patient, a man answered and reports she is not home to call back later.  Follow Up Plan:  Additional outreach attempts will be made to offer the patient care coordination information and services.   Encounter Outcome:  No Answer   Care Coordination Interventions:  No, not indicated    Rowe Pavy, RN, BSN, [facility] [employer] [phone]

## 2022-12-09 ENCOUNTER — Ambulatory Visit: Payer: Medicare HMO | Admitting: Cardiology

## 2022-12-11 ENCOUNTER — Telehealth: Payer: Self-pay

## 2022-12-11 NOTE — Patient Outreach (Signed)
  Care Coordination   Initial Visit Note   12/11/2022 Name: Kaitlyn Jimenez MRN: [id number] DOB: 02-11-1951  Kaitlyn Jimenez is a 72 y.o. year old female who sees Potts, Johnney Killian, NP for primary care. I spoke with  Kaitlyn Jimenez by phone today.  What matters to the patients health and wellness today?  Placed call to patient to review and offer [facility] care coordination. Patient reports that she is doing well.      SDOH assessments and interventions completed:  No     Care Coordination Interventions:  No, not indicated   Follow up plan: No further intervention required.   Encounter Outcome:  Pt. Refused   Rowe Pavy, RN, BSN, C[facility] [employer] [phone]

## 2023-02-04 DIAGNOSIS — L814 Other melanin hyperpigmentation: Secondary | ICD-10-CM | POA: Diagnosis not present

## 2023-02-04 DIAGNOSIS — L820 Inflamed seborrheic keratosis: Secondary | ICD-10-CM | POA: Diagnosis not present

## 2023-02-04 DIAGNOSIS — L719 Rosacea, unspecified: Secondary | ICD-10-CM | POA: Diagnosis not present

## 2023-02-12 DIAGNOSIS — R7989 Other specified abnormal findings of blood chemistry: Secondary | ICD-10-CM | POA: Diagnosis not present

## 2023-02-12 DIAGNOSIS — E538 Deficiency of other specified B group vitamins: Secondary | ICD-10-CM | POA: Diagnosis not present

## 2023-02-12 DIAGNOSIS — R7303 Prediabetes: Secondary | ICD-10-CM | POA: Diagnosis not present

## 2023-02-12 DIAGNOSIS — I4891 Unspecified atrial fibrillation: Secondary | ICD-10-CM | POA: Diagnosis not present

## 2023-02-12 DIAGNOSIS — Z6832 Body mass index (BMI) 32.0-32.9, adult: Secondary | ICD-10-CM | POA: Diagnosis not present

## 2023-02-12 DIAGNOSIS — E785 Hyperlipidemia, unspecified: Secondary | ICD-10-CM | POA: Diagnosis not present

## 2023-02-12 DIAGNOSIS — I10 Essential (primary) hypertension: Secondary | ICD-10-CM | POA: Diagnosis not present

## 2023-02-12 DIAGNOSIS — R1031 Right lower quadrant pain: Secondary | ICD-10-CM | POA: Diagnosis not present

## 2023-02-12 DIAGNOSIS — Z79899 Other long term (current) drug therapy: Secondary | ICD-10-CM | POA: Diagnosis not present

## 2023-02-12 DIAGNOSIS — E063 Autoimmune thyroiditis: Secondary | ICD-10-CM | POA: Diagnosis not present

## 2023-02-19 DIAGNOSIS — L719 Rosacea, unspecified: Secondary | ICD-10-CM | POA: Diagnosis not present

## 2023-02-24 ENCOUNTER — Encounter: Payer: Self-pay | Admitting: Cardiology

## 2023-02-24 ENCOUNTER — Ambulatory Visit: Payer: Medicare HMO | Attending: Cardiology | Admitting: Cardiology

## 2023-02-24 VITALS — BP 154/82 | HR 73 | Ht 61.0 in | Wt 184.4 lb

## 2023-02-24 DIAGNOSIS — Z9889 Other specified postprocedural states: Secondary | ICD-10-CM

## 2023-02-24 DIAGNOSIS — R0609 Other forms of dyspnea: Secondary | ICD-10-CM

## 2023-02-24 DIAGNOSIS — I480 Paroxysmal atrial fibrillation: Secondary | ICD-10-CM | POA: Diagnosis not present

## 2023-02-24 DIAGNOSIS — Z8679 Personal history of other diseases of the circulatory system: Secondary | ICD-10-CM | POA: Diagnosis not present

## 2023-02-24 DIAGNOSIS — I447 Left bundle-branch block, unspecified: Secondary | ICD-10-CM | POA: Insufficient documentation

## 2023-02-24 DIAGNOSIS — E785 Hyperlipidemia, unspecified: Secondary | ICD-10-CM

## 2023-02-24 MED ORDER — ROSUVASTATIN CALCIUM 20 MG PO TABS: 20.0000 mg | ORAL_TABLET | Freq: Every day | ORAL | 3 refills | Status: DC

## 2023-02-24 NOTE — Patient Instructions (Addendum)
Medication Instructions:   INCREASE: Crestor 20mg  1 tablet daily-You may double your current dose and your next refill will reflect your new dose.   Lab Work: Your physician recommends that you return for lab work in: 6 weeks You need to have labs done when you are fasting.  You can come Monday through Friday 8:30 am to 12:00 pm and 1:15 to 4:30. You do not need to make an appointment as the order has already been placed. The labs you are going to have done are ALT, AST Lipids.    Testing/Procedures: Your physician has requested that you have an echocardiogram. Echocardiography is a painless test that uses sound waves to create images of your heart. It provides your doctor with information about the size and shape of your heart and how well your heart's chambers and valves are working. This procedure takes approximately one hour. There are no restrictions for this procedure. Please do NOT wear cologne, perfume, aftershave, or lotions (deodorant is allowed). Please arrive 15 minutes prior to your appointment time.    Follow-Up: At [facility], you and your health needs are our priority.  As part of our continuing mission to provide you with exceptional heart care, we have created designated Provider Care Teams.  These Care Teams include your primary Cardiologist (physician) and Advanced Practice Providers (APPs -  Physician Assistants and Nurse Practitioners) who all work together to provide you with the care you need, when you need it.  We recommend signing up for the patient portal called "MyChart".  Sign up information is provided on this After Visit Summary.  MyChart is used to connect with patients for Virtual Visits (Telemedicine).  Patients are able to view lab/test results, encounter notes, upcoming appointments, etc.  Non-urgent messages can be sent to your provider as well.   To learn more about what you can do with MyChart, go to [email or web address].    Your next appointment:    6 month(s)  The format for your next appointment:   In Person  Provider:   Gypsy Balsam, MD    Other Instructions NA  +

## 2023-02-24 NOTE — Progress Notes (Signed)
Cardiology Office Note:    Date:  02/24/2023   ID:  Kaitlyn Jimenez, DOB 11/02/1950, MRN [id number]  PCP:  Jim Like, NP  Cardiologist:  Gypsy Balsam, MD    Referring MD: Jim Like, NP   Chief Complaint  Patient presents with   Follow-up    History of Present Illness:    Kaitlyn Jimenez is a 72 y.o. female with past medical history significant for coronary artery disease.  She had PTCA and stenting done in 2007, 2013 and last coronary artery stenting has been done in the right coronary artery in December 2020.  Additional problem include paroxysmal atrial fibrillation, status post atrial fibrillation ablation done in October 2022, she does have peripheral vascular disease, dyslipidemia, essential hypertension. Comes today to months for follow-up overall she seems to be doing well.  Denies of any chest pain tightness squeezing pressure mid chest no palpitations.  Past Medical History:  Diagnosis Date   Atypical chest pain 01/07/2019   Coronary artery disease involving native coronary artery of native heart with angina pectoris (HCC) 07/13/2017   Stenting in 2007, 2013 to LAD, last cardiac cath in 02/2019 w/ prox RCA stent and 50% ISR of LAD stent   Dyslipidemia 07/13/2017   Essential hypertension 07/13/2017   Gastroesophageal reflux disease without esophagitis 05/25/2020   Hypertension    MI (myocardial infarction) (HCC)    MI (myocardial infarction) (HCC)    Other hyperlipidemia 05/25/2020   Other specified hypothyroidism 05/25/2020   Primary hypertension 05/25/2020   Thyroid disease    Unstable angina (HCC) 03/01/2019    Past Surgical History:  Procedure Laterality Date   ABDOMINAL HYSTERECTOMY     ATRIAL FIBRILLATION ABLATION N/A 06/21/2021   Procedure: ATRIAL FIBRILLATION ABLATION;  Surgeon: Regan Lemming, MD;  Location: [facility] INVASIVE CV LAB;  Service: Cardiovascular;  Laterality: N/A;   CARDIAC CATHETERIZATION     CHOLECYSTECTOMY     CORONARY ANGIOPLASTY  WITH STENT PLACEMENT     LEFT HEART CATH AND CORONARY ANGIOGRAPHY N/A 03/02/2019   Procedure: LEFT HEART CATH AND CORONARY ANGIOGRAPHY;  Surgeon: Tonny Bollman, MD;  Location: [facility] INVASIVE CV LAB;  Service: Cardiovascular;  Laterality: N/A;    Current Medications: Current Meds  Medication Sig   ALPRAZolam (XANAX) 1 MG tablet Take 1 mg by mouth 2 (two) times daily.   aspirin EC 81 MG tablet Take 1 tablet (81 mg total) by mouth daily. Swallow whole.   cyanocobalamin (,VITAMIN B-12,) 1000 MCG/ML injection Inject 1,000 mcg into the muscle every 30 (thirty) days.   EPINEPHrine 0.3 mg/0.3 mL IJ SOAJ injection Inject 0.3 mg into the muscle as needed for anaphylaxis.   famotidine (PEPCID) 20 MG tablet Take 20 mg by mouth daily as needed for heartburn or indigestion.   fluticasone (FLONASE) 50 MCG/ACT nasal spray Place 1 spray into both nostrils daily as needed for allergies.   levothyroxine (SYNTHROID) 88 MCG tablet Take 88 mcg by mouth daily before breakfast.   metoprolol tartrate (LOPRESSOR) 50 MG tablet Take 1 tablet (50 mg total) by mouth 2 (two) times daily.   mupirocin ointment (BACTROBAN) 2 % Apply 1 application topically 3 (three) times daily.   nitroGLYCERIN (NITROSTAT) 0.4 MG SL tablet Place 1 tablet (0.4 mg total) under the tongue every 5 (five) minutes as needed for chest pain.   polyvinyl alcohol (LIQUIFILM TEARS) 1.4 % ophthalmic solution Place 1 drop into both eyes as needed for dry eyes.   rosuvastatin (CRESTOR) 10 MG tablet Take  1 tablet (10 mg total) by mouth daily.   sodium chloride (OCEAN) 0.65 % SOLN nasal spray Place 1 spray into both nostrils as needed for congestion.   triamcinolone ointment (KENALOG) 0.1 % Apply 1 application topically as needed (demititis).   valsartan (DIOVAN) 160 MG tablet Take 1 tablet (160 mg total) by mouth daily.   Vitamin D, Ergocalciferol, (DRISDOL) 1.25 MG (50000 UT) CAPS capsule Take 50,000 Units by mouth every 30 (thirty) days.     Allergies:    Bee venom, Penciclovir, Shellfish-derived products, Sulfa antibiotics, Brilinta [ticagrelor], Eliquis [apixaban], Prasugrel, Iodinated contrast media, Penicillins, and Ramipril   Social History   Socioeconomic History   Marital status: Married    Spouse name: Not on file   Number of children: Not on file   Years of education: Not on file   Highest education level: Not on file  Occupational History   Not on file  Tobacco Use   Smoking status: Former   Smokeless tobacco: Never  Vaping Use   Vaping Use: Never used  Substance and Sexual Activity   Alcohol use: No   Drug use: No   Sexual activity: Not on file  Other Topics Concern   Not on file  Social History Narrative   Not on file   Social Determinants of Health   Financial Resource Strain: Not on file  Food Insecurity: Not on file  Transportation Needs: Not on file  Physical Activity: Not on file  Stress: Not on file  Social Connections: Not on file     Family History: The patient's family history includes Arrhythmia in her sister; Congestive Heart Failure in her brother; Heart attack in her brother and father; Heart disease in her maternal uncle, mother, and paternal uncle; Rheum arthritis in her brother. ROS:   Please see the history of present illness.    All 14 point review of systems negative except as described per history of present illness  EKGs/Labs/Other Studies Reviewed:         Recent Labs: No results found for requested labs within last 365 days.  Recent Lipid Panel    Component Value Date/Time   CHOL 216 (H) 06/13/2022 1656   TRIG 93 06/13/2022 1656   HDL 59 06/13/2022 1656   CHOLHDL 3.7 06/13/2022 1656   CHOLHDL 4.5 03/02/2019 0253   VLDL 10 03/02/2019 0253   LDLCALC 140 (H) 06/13/2022 1656    Physical Exam:    VS:  BP (!) 154/82 (BP Location: Left Arm, Patient Position: Sitting)   Pulse 73   Ht 5\' 1"  (1.549 m)   Wt 184 lb 6.4 oz (83.6 kg)   SpO2 96%   BMI 34.84 kg/m     Wt Readings  from Last 3 Encounters:  02/24/23 184 lb 6.4 oz (83.6 kg)  06/13/22 188 lb (85.3 kg)  04/08/22 188 lb (85.3 kg)     GEN:  Well nourished, well developed in no acute distress HEENT: Normal NECK: No JVD; No carotid bruits LYMPHATICS: No lymphadenopathy CARDIAC: RRR, no murmurs, no rubs, no gallops RESPIRATORY:  Clear to auscultation without rales, wheezing or rhonchi  ABDOMEN: Soft, non-tender, non-distended MUSCULOSKELETAL:  No edema; No deformity  SKIN: Warm and dry LOWER EXTREMITIES: no swelling NEUROLOGIC:  Alert and oriented x 3 PSYCHIATRIC:  Normal affect   ASSESSMENT:    1. Paroxysmal atrial fibrillation (HCC)   2. Status post ablation of atrial fibrillation   3. Dyslipidemia   4. Left bundle branch block  PLAN:    In order of problems listed above:  Paroxysmal atrial fibrillation status post atrial fibrillation ablation still refused anticoagulation taking only aspirin which I will continue.  Likely denies have any palpitations that would suggest reactivation of the problem. Coronary artery disease stable without any chest pain continue monitoring. Essential hypertension blood pressure like always elevated in the office will recheck before she leaves the room. Left bundle branch block chronic problem will get echocardiogram to assess left ventricle ejection fraction. Dyslipidemia I did review K PN which show me her LDL 132 HDL 52 she is taking 10 mg of Crestor was able to convince him to go up to 20 will recheck fasting lipid profile within the next 6 weeks.   Medication Adjustments/Labs and Tests Ordered: Current medicines are reviewed at length with the patient today.  Concerns regarding medicines are outlined above.  Orders Placed This Encounter  Procedures   EKG 12-Lead   Medication changes: No orders of the defined types were placed in this encounter.   Signed, Georgeanna Lea, MD, [facility] 02/24/2023 2:04 PM    [facility]

## 2023-02-24 NOTE — Addendum Note (Signed)
Addended by: Baldo Ash D on: 02/24/2023 02:16 PM   Modules accepted: Orders

## 2023-03-20 ENCOUNTER — Ambulatory Visit: Payer: Medicare HMO

## 2023-03-23 ENCOUNTER — Ambulatory Visit: Payer: Medicare HMO | Admitting: Cardiology

## 2023-05-19 DIAGNOSIS — E538 Deficiency of other specified B group vitamins: Secondary | ICD-10-CM | POA: Diagnosis not present

## 2023-05-19 DIAGNOSIS — R7989 Other specified abnormal findings of blood chemistry: Secondary | ICD-10-CM | POA: Diagnosis not present

## 2023-05-19 DIAGNOSIS — I4891 Unspecified atrial fibrillation: Secondary | ICD-10-CM | POA: Diagnosis not present

## 2023-05-19 DIAGNOSIS — R7303 Prediabetes: Secondary | ICD-10-CM | POA: Diagnosis not present

## 2023-05-19 DIAGNOSIS — I10 Essential (primary) hypertension: Secondary | ICD-10-CM | POA: Diagnosis not present

## 2023-05-19 DIAGNOSIS — E785 Hyperlipidemia, unspecified: Secondary | ICD-10-CM | POA: Diagnosis not present

## 2023-05-19 DIAGNOSIS — F419 Anxiety disorder, unspecified: Secondary | ICD-10-CM | POA: Diagnosis not present

## 2023-05-19 DIAGNOSIS — E063 Autoimmune thyroiditis: Secondary | ICD-10-CM | POA: Diagnosis not present

## 2023-07-06 ENCOUNTER — Ambulatory Visit: Payer: Medicare HMO | Admitting: Cardiology

## 2023-07-08 DIAGNOSIS — L299 Pruritus, unspecified: Secondary | ICD-10-CM | POA: Diagnosis not present

## 2023-07-08 DIAGNOSIS — L719 Rosacea, unspecified: Secondary | ICD-10-CM | POA: Diagnosis not present

## 2023-08-13 DIAGNOSIS — N3000 Acute cystitis without hematuria: Secondary | ICD-10-CM | POA: Diagnosis not present

## 2023-08-13 DIAGNOSIS — N3091 Cystitis, unspecified with hematuria: Secondary | ICD-10-CM | POA: Diagnosis not present

## 2023-08-25 DIAGNOSIS — Z9181 History of falling: Secondary | ICD-10-CM | POA: Diagnosis not present

## 2023-08-25 DIAGNOSIS — Z1331 Encounter for screening for depression: Secondary | ICD-10-CM | POA: Diagnosis not present

## 2023-08-25 DIAGNOSIS — Z79899 Other long term (current) drug therapy: Secondary | ICD-10-CM | POA: Diagnosis not present

## 2023-08-25 DIAGNOSIS — Z139 Encounter for screening, unspecified: Secondary | ICD-10-CM | POA: Diagnosis not present

## 2023-08-25 DIAGNOSIS — I4891 Unspecified atrial fibrillation: Secondary | ICD-10-CM | POA: Diagnosis not present

## 2023-08-25 DIAGNOSIS — F419 Anxiety disorder, unspecified: Secondary | ICD-10-CM | POA: Diagnosis not present

## 2023-08-25 DIAGNOSIS — I10 Essential (primary) hypertension: Secondary | ICD-10-CM | POA: Diagnosis not present

## 2023-08-25 DIAGNOSIS — H524 Presbyopia: Secondary | ICD-10-CM | POA: Diagnosis not present

## 2023-08-25 DIAGNOSIS — H5203 Hypermetropia, bilateral: Secondary | ICD-10-CM | POA: Diagnosis not present

## 2023-08-25 DIAGNOSIS — E785 Hyperlipidemia, unspecified: Secondary | ICD-10-CM | POA: Diagnosis not present

## 2023-08-25 DIAGNOSIS — R7303 Prediabetes: Secondary | ICD-10-CM | POA: Diagnosis not present

## 2023-08-25 DIAGNOSIS — N390 Urinary tract infection, site not specified: Secondary | ICD-10-CM | POA: Diagnosis not present

## 2023-08-25 DIAGNOSIS — R300 Dysuria: Secondary | ICD-10-CM | POA: Diagnosis not present

## 2023-08-25 DIAGNOSIS — E063 Autoimmune thyroiditis: Secondary | ICD-10-CM | POA: Diagnosis not present

## 2023-08-31 ENCOUNTER — Ambulatory Visit: Payer: Medicare HMO | Attending: Cardiology | Admitting: Cardiology

## 2023-08-31 ENCOUNTER — Encounter: Payer: Self-pay | Admitting: Cardiology

## 2023-08-31 VITALS — BP 140/68 | HR 72 | Ht 61.0 in | Wt 181.6 lb

## 2023-08-31 DIAGNOSIS — I480 Paroxysmal atrial fibrillation: Secondary | ICD-10-CM | POA: Diagnosis not present

## 2023-08-31 DIAGNOSIS — I10 Essential (primary) hypertension: Secondary | ICD-10-CM | POA: Diagnosis not present

## 2023-08-31 DIAGNOSIS — D6869 Other thrombophilia: Secondary | ICD-10-CM | POA: Diagnosis not present

## 2023-08-31 DIAGNOSIS — I2510 Atherosclerotic heart disease of native coronary artery without angina pectoris: Secondary | ICD-10-CM

## 2023-08-31 NOTE — Progress Notes (Signed)
  Electrophysiology Office Note:   Date:  08/31/2023  ID:  Kaitlyn Jimenez, DOB 1951/03/01, MRN [id number]  Primary Cardiologist: Lamar Fitch, MD Primary Heart Failure: None Electrophysiologist: Miciah Shealy Gladis Norton, MD      History of Present Illness:   Kaitlyn Jimenez is a 73 y.o. female with h/o coronary artery disease post multiple stents, atrial fibrillation, hypertension seen today for routine electrophysiology followup.   Since last being seen in our clinic the patient reports doing well.  Since her ablation, she has noted no further episodes of atrial fibrillation.  She continues to be able to do her daily activities.  She is now taking care of her brother who has an LVAD.  she denies chest pain, palpitations, dyspnea, PND, orthopnea, nausea, vomiting, dizziness, syncope, edema, weight gain, or early satiety.   Review of systems complete and found to be negative unless listed in HPI.   EP Information / Studies Reviewed:    EKG is ordered today. Personal review as below.  EKG Interpretation Date/Time:  Monday August 31 2023 14:12:53 EST Ventricular Rate:  71 PR Interval:  164 QRS Duration:  134 QT Interval:  396 QTC Calculation: 430 R Axis:   9  Text Interpretation: Normal sinus rhythm Left bundle branch block When compared with ECG of 22-Jul-2021 10:31, No significant change was found Confirmed by Judythe Postema ([id number]) on 08/31/2023 2:18:29 PM     Risk Assessment/Calculations:    CHA2DS2-VASc Score = 4   This indicates a 4.8% annual risk of stroke. The patient's score is based upon: CHF History: 0 HTN History: 1 Diabetes History: 0 Stroke History: 0 Vascular Disease History: 1 Age Score: 1 Gender Score: 1            Physical Exam:   VS:  BP (!) 140/68   Pulse 72   Ht 5' 1 (1.549 m)   Wt 181 lb 9.6 oz (82.4 kg)   SpO2 98%   BMI 34.31 kg/m    Wt Readings from Last 3 Encounters:  08/31/23 181 lb 9.6 oz (82.4 kg)  02/24/23 184 lb 6.4 oz (83.6 kg)   06/13/22 188 lb (85.3 kg)     GEN: Well nourished, well developed in no acute distress NECK: No JVD; No carotid bruits CARDIAC: Regular rate and rhythm, no murmurs, rubs, gallops RESPIRATORY:  Clear to auscultation without rales, wheezing or rhonchi  ABDOMEN: Soft, non-tender, non-distended EXTREMITIES:  No edema; No deformity   ASSESSMENT AND PLAN:    1.  Paroxysmal atrial fibrillation: Post ablation 06/21/2021.  Remains in sinus rhythm.  No further episodes of atrial fibrillation.  Continue with current management.  2.  Hypertension: Mildly elevated today.  Plan per primary care and primary cardiology  3.  Coronary artery disease: No current chest pain.  Plan per primary cardiology.  4.  Secondary hypercoagulable state: Has stopped Xarelto  post ablation  Follow up with Dr. Norton in 12 months  Signed, Sussie Minor Gladis Norton, MD

## 2023-09-28 DIAGNOSIS — M7061 Trochanteric bursitis, right hip: Secondary | ICD-10-CM | POA: Diagnosis not present

## 2023-09-28 DIAGNOSIS — W010XXA Fall on same level from slipping, tripping and stumbling without subsequent striking against object, initial encounter: Secondary | ICD-10-CM | POA: Diagnosis not present

## 2023-09-28 DIAGNOSIS — M1611 Unilateral primary osteoarthritis, right hip: Secondary | ICD-10-CM | POA: Diagnosis not present

## 2023-10-05 DIAGNOSIS — Z139 Encounter for screening, unspecified: Secondary | ICD-10-CM | POA: Diagnosis not present

## 2023-10-05 DIAGNOSIS — Z0000 Encounter for general adult medical examination without abnormal findings: Secondary | ICD-10-CM | POA: Diagnosis not present

## 2023-10-05 DIAGNOSIS — Z9181 History of falling: Secondary | ICD-10-CM | POA: Diagnosis not present

## 2023-10-15 DIAGNOSIS — L209 Atopic dermatitis, unspecified: Secondary | ICD-10-CM | POA: Diagnosis not present

## 2023-10-15 DIAGNOSIS — L299 Pruritus, unspecified: Secondary | ICD-10-CM | POA: Diagnosis not present

## 2023-11-19 DIAGNOSIS — B370 Candidal stomatitis: Secondary | ICD-10-CM | POA: Diagnosis not present

## 2023-11-19 DIAGNOSIS — E538 Deficiency of other specified B group vitamins: Secondary | ICD-10-CM | POA: Diagnosis not present

## 2023-11-26 DIAGNOSIS — I10 Essential (primary) hypertension: Secondary | ICD-10-CM | POA: Diagnosis not present

## 2023-11-26 DIAGNOSIS — E063 Autoimmune thyroiditis: Secondary | ICD-10-CM | POA: Diagnosis not present

## 2023-11-26 DIAGNOSIS — E559 Vitamin D deficiency, unspecified: Secondary | ICD-10-CM | POA: Diagnosis not present

## 2023-11-26 DIAGNOSIS — I2510 Atherosclerotic heart disease of native coronary artery without angina pectoris: Secondary | ICD-10-CM | POA: Diagnosis not present

## 2023-11-26 DIAGNOSIS — E538 Deficiency of other specified B group vitamins: Secondary | ICD-10-CM | POA: Diagnosis not present

## 2023-11-26 DIAGNOSIS — R7303 Prediabetes: Secondary | ICD-10-CM | POA: Diagnosis not present

## 2023-11-26 DIAGNOSIS — R7989 Other specified abnormal findings of blood chemistry: Secondary | ICD-10-CM | POA: Diagnosis not present

## 2023-11-26 DIAGNOSIS — E785 Hyperlipidemia, unspecified: Secondary | ICD-10-CM | POA: Diagnosis not present

## 2023-11-26 DIAGNOSIS — I4891 Unspecified atrial fibrillation: Secondary | ICD-10-CM | POA: Diagnosis not present

## 2024-04-06 ENCOUNTER — Encounter (HOSPITAL_COMMUNITY): Payer: Self-pay

## 2024-04-06 ENCOUNTER — Other Ambulatory Visit: Payer: Self-pay

## 2024-04-06 ENCOUNTER — Emergency Department (HOSPITAL_COMMUNITY)

## 2024-04-06 ENCOUNTER — Inpatient Hospital Stay (HOSPITAL_COMMUNITY)
Admission: EM | Admit: 2024-04-06 | Discharge: 2024-04-08 | DRG: 280 | Disposition: A | Discharge status: Home / Self Care | Attending: Family Medicine | Admitting: Family Medicine

## 2024-04-06 DIAGNOSIS — J9601 Acute respiratory failure with hypoxia: Secondary | ICD-10-CM | POA: Diagnosis present

## 2024-04-06 DIAGNOSIS — E7800 Pure hypercholesterolemia, unspecified: Secondary | ICD-10-CM | POA: Diagnosis present

## 2024-04-06 DIAGNOSIS — Z955 Presence of coronary angioplasty implant and graft: Secondary | ICD-10-CM

## 2024-04-06 DIAGNOSIS — I739 Peripheral vascular disease, unspecified: Secondary | ICD-10-CM | POA: Diagnosis present

## 2024-04-06 DIAGNOSIS — Z1152 Encounter for screening for COVID-19: Secondary | ICD-10-CM

## 2024-04-06 DIAGNOSIS — Z91013 Allergy to seafood: Secondary | ICD-10-CM

## 2024-04-06 DIAGNOSIS — Z9049 Acquired absence of other specified parts of digestive tract: Secondary | ICD-10-CM

## 2024-04-06 DIAGNOSIS — I160 Hypertensive urgency: Secondary | ICD-10-CM | POA: Diagnosis present

## 2024-04-06 DIAGNOSIS — I110 Hypertensive heart disease with heart failure: Secondary | ICD-10-CM | POA: Diagnosis present

## 2024-04-06 DIAGNOSIS — R072 Precordial pain: Secondary | ICD-10-CM | POA: Diagnosis not present

## 2024-04-06 DIAGNOSIS — E871 Hypo-osmolality and hyponatremia: Secondary | ICD-10-CM | POA: Diagnosis present

## 2024-04-06 DIAGNOSIS — Z79899 Other long term (current) drug therapy: Secondary | ICD-10-CM

## 2024-04-06 DIAGNOSIS — Z951 Presence of aortocoronary bypass graft: Secondary | ICD-10-CM

## 2024-04-06 DIAGNOSIS — I2510 Atherosclerotic heart disease of native coronary artery without angina pectoris: Secondary | ICD-10-CM | POA: Diagnosis present

## 2024-04-06 DIAGNOSIS — Z8249 Family history of ischemic heart disease and other diseases of the circulatory system: Secondary | ICD-10-CM

## 2024-04-06 DIAGNOSIS — I214 Non-ST elevation (NSTEMI) myocardial infarction: Principal | ICD-10-CM | POA: Diagnosis present

## 2024-04-06 DIAGNOSIS — Z90710 Acquired absence of both cervix and uterus: Secondary | ICD-10-CM

## 2024-04-06 DIAGNOSIS — Z880 Allergy status to penicillin: Secondary | ICD-10-CM

## 2024-04-06 DIAGNOSIS — E039 Hypothyroidism, unspecified: Secondary | ICD-10-CM | POA: Diagnosis present

## 2024-04-06 DIAGNOSIS — E7849 Other hyperlipidemia: Secondary | ICD-10-CM | POA: Diagnosis present

## 2024-04-06 DIAGNOSIS — Z91030 Bee allergy status: Secondary | ICD-10-CM

## 2024-04-06 DIAGNOSIS — Z79890 Hormone replacement therapy (postmenopausal): Secondary | ICD-10-CM

## 2024-04-06 DIAGNOSIS — Z7902 Long term (current) use of antithrombotics/antiplatelets: Secondary | ICD-10-CM

## 2024-04-06 DIAGNOSIS — E038 Other specified hypothyroidism: Secondary | ICD-10-CM | POA: Diagnosis present

## 2024-04-06 DIAGNOSIS — Z91041 Radiographic dye allergy status: Secondary | ICD-10-CM

## 2024-04-06 DIAGNOSIS — Z888 Allergy status to other drugs, medicaments and biological substances status: Secondary | ICD-10-CM

## 2024-04-06 DIAGNOSIS — R079 Chest pain, unspecified: Principal | ICD-10-CM | POA: Diagnosis present

## 2024-04-06 DIAGNOSIS — I10 Essential (primary) hypertension: Secondary | ICD-10-CM | POA: Diagnosis present

## 2024-04-06 DIAGNOSIS — I480 Paroxysmal atrial fibrillation: Secondary | ICD-10-CM | POA: Diagnosis present

## 2024-04-06 DIAGNOSIS — Z8679 Personal history of other diseases of the circulatory system: Secondary | ICD-10-CM

## 2024-04-06 DIAGNOSIS — K219 Gastro-esophageal reflux disease without esophagitis: Secondary | ICD-10-CM | POA: Diagnosis present

## 2024-04-06 DIAGNOSIS — I252 Old myocardial infarction: Secondary | ICD-10-CM

## 2024-04-06 DIAGNOSIS — I447 Left bundle-branch block, unspecified: Secondary | ICD-10-CM | POA: Diagnosis present

## 2024-04-06 DIAGNOSIS — I5032 Chronic diastolic (congestive) heart failure: Secondary | ICD-10-CM | POA: Diagnosis present

## 2024-04-06 DIAGNOSIS — Z882 Allergy status to sulfonamides status: Secondary | ICD-10-CM

## 2024-04-06 DIAGNOSIS — Z7982 Long term (current) use of aspirin: Secondary | ICD-10-CM

## 2024-04-06 DIAGNOSIS — I2583 Coronary atherosclerosis due to lipid rich plaque: Secondary | ICD-10-CM | POA: Diagnosis present

## 2024-04-06 DIAGNOSIS — Z87891 Personal history of nicotine dependence: Secondary | ICD-10-CM

## 2024-04-06 DIAGNOSIS — F32A Depression, unspecified: Secondary | ICD-10-CM | POA: Diagnosis present

## 2024-04-06 LAB — BASIC METABOLIC PANEL WITH GFR
Anion gap: 8 (ref 5–15)
BUN: 9 mg/dL (ref 8–23)
CO2: 22 mmol/L (ref 22–32)
Calcium: 8.9 mg/dL (ref 8.9–10.3)
Chloride: 98 mmol/L (ref 98–111)
Creatinine, Ser: 0.86 mg/dL (ref 0.44–1.00)
GFR, Estimated: >60 mL/min (ref >60)
Glucose, Bld: 110 mg/dL — ABNORMAL HIGH (ref 70–99)
Potassium: 4.1 mmol/L (ref 3.5–5.1)
Sodium: 128 mmol/L — ABNORMAL LOW (ref 135–145)

## 2024-04-06 LAB — RESP PANEL BY RT-PCR (RSV, FLU A&B, COVID)  RVPGX2
Influenza A by PCR: NEGATIVE
Influenza B by PCR: NEGATIVE
Resp Syncytial Virus by PCR: NEGATIVE
SARS Coronavirus 2 by RT PCR: NEGATIVE

## 2024-04-06 LAB — CBC
HCT: 39.4 % (ref 36.0–46.0)
Hemoglobin: 12.7 g/dL (ref 12.0–15.0)
MCH: 29.3 pg (ref 26.0–34.0)
MCHC: 32.2 g/dL (ref 30.0–36.0)
MCV: 90.8 fL (ref 80.0–100.0)
Platelets: 301 K/uL (ref 150–400)
RBC: 4.34 MIL/uL (ref 3.87–5.11)
RDW: 12.5 % (ref 11.5–15.5)
WBC: 9.2 K/uL (ref 4.0–10.5)
nRBC: 0 % (ref 0.0–0.2)

## 2024-04-06 LAB — TROPONIN I (HIGH SENSITIVITY): Troponin I (High Sensitivity): 14 ng/L (ref <18)

## 2024-04-06 MED ORDER — LEVALBUTEROL HCL 0.63 MG/3ML IN NEBU
0.6300 mg | INHALATION_SOLUTION | Freq: Once | RESPIRATORY_TRACT | Status: AC
  Administered 2024-04-06 (×2): 0.63 mg via RESPIRATORY_TRACT
  Filled 2024-04-06: qty 3

## 2024-04-06 MED ORDER — IPRATROPIUM-ALBUTEROL 0.5-2.5 (3) MG/3ML IN SOLN
3.0000 mL | Freq: Once | RESPIRATORY_TRACT | Status: DC
  Filled 2024-04-06: qty 3

## 2024-04-06 NOTE — ED Provider Triage Note (Signed)
 Emergency Medicine Provider Triage Evaluation Note  Kaitlyn Jimenez , a 73 y.o. female  was evaluated in triage.  Pt complains of shob, cough, chest pain for 2 days. Hx of MI but feels different than previous MI. Hypoxic with EMS, placed on 4L [place].  Review of Systems  Positive: Chest pain, shob, cough Negative:   Physical Exam  SpO2 97%  Gen:   Awake, no distress   Resp:  Normal effort  MSK:   Moves extremities without difficulty  Other:  Rhonchi, wheezing throughout, right worse than left  Medical Decision Making  Medically screening exam initiated at 9:54 PM.  Appropriate orders placed.  Kaitlyn Jimenez was informed that the remainder of the evaluation will be completed by another provider, this initial triage assessment does not replace that evaluation, and the importance of remaining in the ED until their evaluation is complete.  Workup initiated in triage    Kaitlyn Jimenez 04/06/24 2154

## 2024-04-06 NOTE — ED Triage Notes (Signed)
 Patient bib EMS from home with complaints of chest pain and shortness of breath. EMS reports the chest pain radiates to the front of her neck and it comes and goes. EMS also reports her oxygen was in the 80's on room air.  4L of o2 given and saturation is now 97% 0.4 Nitro given and 324 of aspirin  given enroute.   No change in chest pain after the nitro.

## 2024-04-06 NOTE — ED Provider Notes (Signed)
 [place] EMERGENCY DEPARTMENT AT [facility] Provider Note   CSN: [id number] Arrival date & time: 04/06/24  2148     Patient presents with: Chest Pain   Kaitlyn Jimenez is a 73 y.o. female history of CAD/MI status post PCI with stenting 2007, 2013 and 2020, hypertension, dyslipidemia, hypothyroidism presents with complaints of chest pain x 2 days.  Symptoms are constant not associated with exertion.  No associated shortness of breath, dizziness or nausea.  Pain is central sternum does not radiate.  No history of PE.    Chest Pain  Past Medical History:  Diagnosis Date   Atypical chest pain 01/07/2019   Coronary artery disease involving native coronary artery of native heart with angina pectoris (HCC) 07/13/2017   Stenting in 2007, 2013 to LAD, last cardiac cath in 02/2019 w/ prox RCA stent and 50% ISR of LAD stent   Dyslipidemia 07/13/2017   Essential hypertension 07/13/2017   Gastroesophageal reflux disease without esophagitis 05/25/2020   Hypertension    MI (myocardial infarction) (HCC)    MI (myocardial infarction) (HCC)    Other hyperlipidemia 05/25/2020   Other specified hypothyroidism 05/25/2020   Primary hypertension 05/25/2020   Thyroid  disease    Unstable angina (HCC) 03/01/2019      Past Medical History:  Diagnosis Date   Atypical chest pain 01/07/2019   Coronary artery disease involving native coronary artery of native heart with angina pectoris (HCC) 07/13/2017   Stenting in 2007, 2013 to LAD, last cardiac cath in 02/2019 w/ prox RCA stent and 50% ISR of LAD stent   Dyslipidemia 07/13/2017   Essential hypertension 07/13/2017   Gastroesophageal reflux disease without esophagitis 05/25/2020   Hypertension    MI (myocardial infarction) (HCC)    MI (myocardial infarction) (HCC)    Other hyperlipidemia 05/25/2020   Other specified hypothyroidism 05/25/2020   Primary hypertension 05/25/2020   Thyroid  disease    Unstable angina (HCC) 03/01/2019   Past Surgical  History:  Procedure Laterality Date   ABDOMINAL HYSTERECTOMY     ATRIAL FIBRILLATION ABLATION N/A 06/21/2021   Procedure: ATRIAL FIBRILLATION ABLATION;  Surgeon: Inocencio Soyla Lunger, MD;  Location: [facility] INVASIVE CV LAB;  Service: Cardiovascular;  Laterality: N/A;   CARDIAC CATHETERIZATION     CHOLECYSTECTOMY     CORONARY ANGIOPLASTY WITH STENT PLACEMENT     LEFT HEART CATH AND CORONARY ANGIOGRAPHY N/A 03/02/2019   Procedure: LEFT HEART CATH AND CORONARY ANGIOGRAPHY;  Surgeon: Wonda Sharper, MD;  Location: [facility] INVASIVE CV LAB;  Service: Cardiovascular;  Laterality: N/A;     Prior to Admission medications   Medication Sig Start Date End Date Taking? Authorizing Provider  ALPRAZolam  (XANAX ) 1 MG tablet Take 1 mg by mouth 2 (two) times daily.    [provider]  aspirin  EC 81 MG tablet Take 1 tablet (81 mg total) by mouth daily. Swallow whole. 05/02/21   Camnitz, Soyla Lunger, MD  cyanocobalamin  (,VITAMIN B-12,) 1000 MCG/ML injection Inject 1,000 mcg into the muscle every 30 (thirty) days.    [provider]  EPINEPHrine 0.3 mg/0.3 mL IJ SOAJ injection Inject 0.3 mg into the muscle as needed for anaphylaxis.    [provider]  famotidine  (PEPCID ) 20 MG tablet Take 20 mg by mouth daily as needed for heartburn or indigestion.    [provider]  fluticasone (FLONASE) 50 MCG/ACT nasal spray Place 1 spray into both nostrils daily as needed for allergies. 03/23/20   [provider]  levothyroxine  (SYNTHROID ) 88 MCG  tablet Take 88 mcg by mouth daily before breakfast.    [provider]  metoprolol  tartrate (LOPRESSOR ) 50 MG tablet Take 1 tablet (50 mg total) by mouth 2 (two) times daily. 01/06/19   Krasowski, Robert J, MD  mupirocin ointment (BACTROBAN) 2 % Apply 1 application topically 3 (three) times daily. 04/02/21   [provider]  nitroGLYCERIN  (NITROSTAT ) 0.4 MG SL tablet Place 1 tablet (0.4 mg total) under the tongue every 5 (five) minutes  as needed for chest pain. 03/03/19   Marcine Catalan M, PA-C  polyvinyl alcohol (LIQUIFILM TEARS) 1.4 % ophthalmic solution Place 1 drop into both eyes as needed for dry eyes.    [provider]  rosuvastatin  (CRESTOR ) 5 MG tablet Take 5 mg by mouth daily. 08/03/23   [provider]  sodium chloride  (OCEAN) 0.65 % SOLN nasal spray Place 1 spray into both nostrils as needed for congestion.    [provider]  triamcinolone  ointment (KENALOG ) 0.1 % Apply 1 application topically as needed (demititis). 01/19/20   [provider]  valsartan  (DIOVAN ) 160 MG tablet Take 1 tablet (160 mg total) by mouth daily. 02/23/20   Krasowski, Robert J, MD  Vitamin D, Ergocalciferol, (DRISDOL) 1.25 MG (50000 UT) CAPS capsule Take 50,000 Units by mouth every 30 (thirty) days.    [provider]    Allergies: Bee venom, Penciclovir, Shellfish-derived products, Sulfa antibiotics, Brilinta  [ticagrelor ], Eliquis  [apixaban ], Prasugrel , Iodinated contrast media, Penicillins, and Ramipril    Review of Systems  Cardiovascular:  Positive for chest pain.    Updated Vital Signs BP (!) 169/76 (BP Location: Right Arm)   Pulse 84   Temp 98.4 F (36.9 C) (Oral)   Resp (!) 24   SpO2 98%   Physical Exam Vitals and nursing note reviewed.  Constitutional:      General: She is not in acute distress.    Appearance: She is well-developed.  HENT:     Head: Normocephalic and atraumatic.  Eyes:     Conjunctiva/sclera: Conjunctivae normal.  Cardiovascular:     Rate and Rhythm: Normal rate and regular rhythm.     Heart sounds: No murmur heard. Pulmonary:     Effort: Pulmonary effort is normal. No respiratory distress.     Comments: Bibasilar Rales and rhonchi Chest:     Chest wall: Tenderness present.  Abdominal:     Palpations: Abdomen is soft.     Tenderness: There is no abdominal tenderness.  Musculoskeletal:        General: No swelling.     Cervical back: Neck supple.   Skin:    General: Skin is warm and dry.     Capillary Refill: Capillary refill takes less than 2 seconds.  Neurological:     Mental Status: She is alert.  Psychiatric:        Mood and Affect: Mood normal.     (all labs ordered are listed, but only abnormal results are displayed) Labs Reviewed  BASIC METABOLIC PANEL WITH GFR - Abnormal; Notable for the following components:      Result Value   Sodium 128 (*)    Glucose, Bld 110 (*)    All other components within normal limits  TROPONIN I (HIGH SENSITIVITY) - Abnormal; Notable for the following components:   Troponin I (High Sensitivity) 48 (*)    All other components within normal limits  RESP PANEL BY RT-PCR (RSV, FLU A&B, COVID)  RVPGX2  CBC  BRAIN NATRIURETIC PEPTIDE  COMPREHENSIVE METABOLIC PANEL  WITH GFR  CBC  PROTIME-INR  APTT  LIPID PANEL  TROPONIN I (HIGH SENSITIVITY)    EKG: EKG Interpretation Date/Time:  Wednesday April 06 2024 22:01:38 EDT Ventricular Rate:  81 PR Interval:  168 QRS Duration:  136 QT Interval:  400 QTC Calculation: 464 R Axis:   19  Text Interpretation: Normal sinus rhythm Left bundle branch block Abnormal ECG Compared with previous EKG from 08/31/2023 Confirmed by Gennaro Bouchard ([id number]) on 04/06/2024 10:03:55 PM  Radiology: [facility] Chest 2 View Result Date: 04/06/2024 CLINICAL DATA:  Chest pain EXAM: CHEST - 2 VIEW COMPARISON:  Chest x-ray 03/01/2019 FINDINGS: There are increased interstitial markings diffusely throughout both lungs. There is no lung consolidation, pleural effusion or pneumothorax. The cardiomediastinal silhouette is within normal limits. No acute fractures are seen. Cervical spinal fusion plate is present. IMPRESSION: Increased interstitial markings diffusely throughout both lungs, which may represent pulmonary edema or atypical infection. Electronically Signed   By: Greig Pique M.D.   On: 04/06/2024 23:27     Procedures   Medications Ordered in the ED  aspirin  EC tablet  81 mg (has no administration in time range)  nitroGLYCERIN  (NITROSTAT ) SL tablet 0.4 mg (has no administration in time range)  rosuvastatin  (CRESTOR ) tablet 5 mg (has no administration in time range)  levothyroxine  (SYNTHROID ) tablet 88 mcg (has no administration in time range)  famotidine  (PEPCID ) tablet 20 mg (has no administration in time range)  heparin  injection 5,000 Units (has no administration in time range)  sodium chloride  flush (NS) 0.9 % injection 3 mL (has no administration in time range)  sodium chloride  flush (NS) 0.9 % injection 3 mL (has no administration in time range)  sodium chloride  flush (NS) 0.9 % injection 3 mL (has no administration in time range)  0.9 %  sodium chloride  infusion (has no administration in time range)  acetaminophen  (TYLENOL ) tablet 650 mg (has no administration in time range)    Or  acetaminophen  (TYLENOL ) suppository 650 mg (has no administration in time range)  ondansetron  (ZOFRAN ) tablet 4 mg (has no administration in time range)    Or  ondansetron  (ZOFRAN ) injection 4 mg (has no administration in time range)  levalbuterol  (XOPENEX ) nebulizer solution 0.63 mg (has no administration in time range)  metoprolol  tartrate (LOPRESSOR ) tablet 50 mg (has no administration in time range)  irbesartan  (AVAPRO ) tablet 150 mg (has no administration in time range)  levalbuterol  (XOPENEX ) nebulizer solution 0.63 mg (0.63 mg Nebulization Given 04/06/24 2224)                                    Medical Decision Making Amount and/or Complexity of Data Reviewed Labs: ordered. Radiology: ordered.   This patient presents to the ED with chief complaint(s) of chest pain.  The complaint involves an extensive differential diagnosis and also carries with it a high risk of complications and morbidity.   Pertinent past medical history as listed in HPI  The differential diagnosis includes  ACS, PE, aortic dissection, pneumonia, COPD, pneumothorax Additional history  obtained: Records reviewed Care Everywhere/External Records  Assessment and management:   Hemodynamically stable, nontoxic-appearing patient presented with complaints of constant chest pain x 2 days.  Her symptoms are not exertional.  Not associated with any URI symptoms.  Patient does have a strong cardiac history.  She does have reproducible chest wall tenderness.  Per EMS patient was noted to have O2 in the 80s  on room air.  She was given nitro, 325 of aspirin , albuterol  and placed on 4 L nasal cannula.  Patient has no respiratory history, does not smoke.  Currently she is satting 97% on room air.  She does have bibasilar rales and rhonchi.  She has no risk factors for PE.  POCUS demonstrates diffuse B-lines without effusion, EF appears to be preserved.  She has no lower extremity edema.  Her EKG appears to be unchanged and her troponins are without elevation.  Will add on BNP to evaluate for CHF exacerbation.  Independent ECG interpretation:  Normal sinus rhythm with left bundle, unchanged  Independent labs interpretation:  The following labs were independently interpreted:  CBC unremarkable, troponin without elevation, BMP with hyponatremia of 128, respiratory panel negative, BNP within normal limits, second troponin elevated at 48  Independent visualization and interpretation of imaging: I independently visualized the following imaging with scope of interpretation limited to determining acute life threatening conditions related to emergency care:  CXR increased interstitial markings   Consultations obtained:   Consult hospitalist Dr. Karmen, will admit, recommended cardiology consult as well Cardiology Dr. Almetta recommended heparin  with likely plans for cath  Disposition:   Patient will be admitted for further evaluation.  Social Determinants of Health:   none  This note was dictated with voice recognition software.  Despite best efforts at proofreading, errors may have occurred  which can change the documentation meaning.       Final diagnoses:  Chest pain, unspecified type    ED Discharge Orders     None          Jazmina Muhlenkamp H, PA-C 04/07/24 [id number]    Albertina Dixon, MD 04/11/24 [phone]

## 2024-04-06 NOTE — ED Notes (Signed)
 Patient transported to X-ray

## 2024-04-07 ENCOUNTER — Encounter (HOSPITAL_COMMUNITY): Payer: Self-pay | Admitting: Internal Medicine

## 2024-04-07 ENCOUNTER — Inpatient Hospital Stay (HOSPITAL_COMMUNITY)

## 2024-04-07 ENCOUNTER — Encounter (HOSPITAL_COMMUNITY)
Admission: EM | Disposition: A | Discharge status: Home / Self Care | Payer: Self-pay | Source: Home / Self Care | Attending: Family Medicine

## 2024-04-07 DIAGNOSIS — Z79899 Other long term (current) drug therapy: Secondary | ICD-10-CM | POA: Diagnosis not present

## 2024-04-07 DIAGNOSIS — I110 Hypertensive heart disease with heart failure: Secondary | ICD-10-CM | POA: Diagnosis present

## 2024-04-07 DIAGNOSIS — Z91013 Allergy to seafood: Secondary | ICD-10-CM | POA: Diagnosis not present

## 2024-04-07 DIAGNOSIS — Z955 Presence of coronary angioplasty implant and graft: Secondary | ICD-10-CM | POA: Diagnosis not present

## 2024-04-07 DIAGNOSIS — I447 Left bundle-branch block, unspecified: Secondary | ICD-10-CM

## 2024-04-07 DIAGNOSIS — Z91030 Bee allergy status: Secondary | ICD-10-CM | POA: Diagnosis not present

## 2024-04-07 DIAGNOSIS — E871 Hypo-osmolality and hyponatremia: Secondary | ICD-10-CM | POA: Diagnosis present

## 2024-04-07 DIAGNOSIS — I160 Hypertensive urgency: Secondary | ICD-10-CM | POA: Diagnosis present

## 2024-04-07 DIAGNOSIS — Z91041 Radiographic dye allergy status: Secondary | ICD-10-CM | POA: Diagnosis not present

## 2024-04-07 DIAGNOSIS — I739 Peripheral vascular disease, unspecified: Secondary | ICD-10-CM | POA: Insufficient documentation

## 2024-04-07 DIAGNOSIS — J9601 Acute respiratory failure with hypoxia: Secondary | ICD-10-CM | POA: Insufficient documentation

## 2024-04-07 DIAGNOSIS — I2510 Atherosclerotic heart disease of native coronary artery without angina pectoris: Secondary | ICD-10-CM

## 2024-04-07 DIAGNOSIS — Z8679 Personal history of other diseases of the circulatory system: Secondary | ICD-10-CM

## 2024-04-07 DIAGNOSIS — I2089 Other forms of angina pectoris: Secondary | ICD-10-CM | POA: Diagnosis not present

## 2024-04-07 DIAGNOSIS — F32A Depression, unspecified: Secondary | ICD-10-CM | POA: Diagnosis present

## 2024-04-07 DIAGNOSIS — Z882 Allergy status to sulfonamides status: Secondary | ICD-10-CM | POA: Diagnosis not present

## 2024-04-07 DIAGNOSIS — Z87891 Personal history of nicotine dependence: Secondary | ICD-10-CM | POA: Diagnosis not present

## 2024-04-07 DIAGNOSIS — I10 Essential (primary) hypertension: Secondary | ICD-10-CM

## 2024-04-07 DIAGNOSIS — Z1152 Encounter for screening for COVID-19: Secondary | ICD-10-CM | POA: Diagnosis not present

## 2024-04-07 DIAGNOSIS — I5032 Chronic diastolic (congestive) heart failure: Secondary | ICD-10-CM | POA: Diagnosis present

## 2024-04-07 DIAGNOSIS — I214 Non-ST elevation (NSTEMI) myocardial infarction: Secondary | ICD-10-CM | POA: Diagnosis present

## 2024-04-07 DIAGNOSIS — Z79890 Hormone replacement therapy (postmenopausal): Secondary | ICD-10-CM | POA: Diagnosis not present

## 2024-04-07 DIAGNOSIS — R072 Precordial pain: Secondary | ICD-10-CM | POA: Diagnosis present

## 2024-04-07 DIAGNOSIS — Z8249 Family history of ischemic heart disease and other diseases of the circulatory system: Secondary | ICD-10-CM | POA: Diagnosis not present

## 2024-04-07 DIAGNOSIS — E7800 Pure hypercholesterolemia, unspecified: Secondary | ICD-10-CM

## 2024-04-07 DIAGNOSIS — E038 Other specified hypothyroidism: Secondary | ICD-10-CM

## 2024-04-07 DIAGNOSIS — R079 Chest pain, unspecified: Secondary | ICD-10-CM | POA: Diagnosis not present

## 2024-04-07 DIAGNOSIS — I480 Paroxysmal atrial fibrillation: Secondary | ICD-10-CM | POA: Diagnosis present

## 2024-04-07 DIAGNOSIS — Z951 Presence of aortocoronary bypass graft: Secondary | ICD-10-CM | POA: Diagnosis not present

## 2024-04-07 DIAGNOSIS — I2583 Coronary atherosclerosis due to lipid rich plaque: Secondary | ICD-10-CM

## 2024-04-07 HISTORY — PX: LEFT HEART CATH AND CORONARY ANGIOGRAPHY: CATH118249

## 2024-04-07 LAB — COMPREHENSIVE METABOLIC PANEL WITH GFR
ALT: 12 U/L (ref 0–44)
AST: 20 U/L (ref 15–41)
Albumin: 3.4 g/dL — ABNORMAL LOW (ref 3.5–5.0)
Alkaline Phosphatase: 116 U/L (ref 38–126)
Anion gap: 9 (ref 5–15)
BUN: 9 mg/dL (ref 8–23)
CO2: 23 mmol/L (ref 22–32)
Calcium: 9.1 mg/dL (ref 8.9–10.3)
Chloride: 99 mmol/L (ref 98–111)
Creatinine, Ser: 0.9 mg/dL (ref 0.44–1.00)
GFR, Estimated: >60 mL/min (ref >60)
Glucose, Bld: 118 mg/dL — ABNORMAL HIGH (ref 70–99)
Potassium: 4.2 mmol/L (ref 3.5–5.1)
Sodium: 131 mmol/L — ABNORMAL LOW (ref 135–145)
Total Bilirubin: 0.8 mg/dL (ref 0.0–1.2)
Total Protein: 6.8 g/dL (ref 6.5–8.1)

## 2024-04-07 LAB — CBC
HCT: 39.5 % (ref 36.0–46.0)
HCT: 41.2 % (ref 36.0–46.0)
Hemoglobin: 12.8 g/dL (ref 12.0–15.0)
Hemoglobin: 13.9 g/dL (ref 12.0–15.0)
MCH: 29.3 pg (ref 26.0–34.0)
MCH: 29.7 pg (ref 26.0–34.0)
MCHC: 32.4 g/dL (ref 30.0–36.0)
MCHC: 33.7 g/dL (ref 30.0–36.0)
MCV: 90.4 fL (ref 80.0–100.0)
MCV: 88 fL (ref 80.0–100.0)
Platelets: 273 K/uL (ref 150–400)
Platelets: 327 K/uL (ref 150–400)
RBC: 4.37 MIL/uL (ref 3.87–5.11)
RBC: 4.68 MIL/uL (ref 3.87–5.11)
RDW: 12.5 % (ref 11.5–15.5)
RDW: 12.4 % (ref 11.5–15.5)
WBC: 11.8 K/uL — ABNORMAL HIGH (ref 4.0–10.5)
WBC: 10.2 K/uL (ref 4.0–10.5)
nRBC: 0 % (ref 0.0–0.2)
nRBC: 0 % (ref 0.0–0.2)

## 2024-04-07 LAB — TROPONIN I (HIGH SENSITIVITY)
Troponin I (High Sensitivity): 48 ng/L — ABNORMAL HIGH (ref <18)
Troponin I (High Sensitivity): 68 ng/L — ABNORMAL HIGH (ref <18)
Troponin I (High Sensitivity): 92 ng/L — ABNORMAL HIGH (ref <18)

## 2024-04-07 LAB — PROTIME-INR
INR: 1 (ref 0.8–1.2)
Prothrombin Time: 13.5 s (ref 11.4–15.2)

## 2024-04-07 LAB — LIPID PANEL
Cholesterol: 218 mg/dL — ABNORMAL HIGH (ref 0–200)
HDL: 47 mg/dL (ref >40)
LDL Cholesterol: 155 mg/dL — ABNORMAL HIGH (ref 0–99)
Total CHOL/HDL Ratio: 4.6 ratio
Triglycerides: 78 mg/dL (ref <150)
VLDL: 16 mg/dL (ref 0–40)

## 2024-04-07 LAB — HEMOGLOBIN A1C
Hgb A1c MFr Bld: 5.3 % (ref 4.8–5.6)
Mean Plasma Glucose: 105.41 mg/dL

## 2024-04-07 LAB — BRAIN NATRIURETIC PEPTIDE: B Natriuretic Peptide: 89.9 pg/mL (ref 0.0–100.0)

## 2024-04-07 LAB — CREATININE, SERUM
Creatinine, Ser: 1 mg/dL (ref 0.44–1.00)
GFR, Estimated: 59 mL/min — ABNORMAL LOW (ref >60)

## 2024-04-07 LAB — HEPARIN LEVEL (UNFRACTIONATED): Heparin Unfractionated: <0.1 [IU]/mL — ABNORMAL LOW (ref 0.30–0.70)

## 2024-04-07 LAB — APTT: aPTT: 30 s (ref 24–36)

## 2024-04-07 MED ORDER — PREDNISONE 5 MG PO TABS
50.0000 mg | ORAL_TABLET | Freq: Once | ORAL | Status: AC
  Administered 2024-04-07: 50 mg via ORAL
  Filled 2024-04-07: qty 2

## 2024-04-07 MED ORDER — FAMOTIDINE 20 MG PO TABS
20.0000 mg | ORAL_TABLET | Freq: Two times a day (BID) | ORAL | Status: DC
  Administered 2024-04-07 – 2024-04-08 (×2): 20 mg via ORAL
  Filled 2024-04-07 (×2): qty 1

## 2024-04-07 MED ORDER — ONDANSETRON HCL 4 MG PO TABS: 4.0000 mg | ORAL_TABLET | Freq: Four times a day (QID) | ORAL | Status: DC | PRN

## 2024-04-07 MED ORDER — FREE WATER: 500.0000 mL | Freq: Once | Status: DC

## 2024-04-07 MED ORDER — VERAPAMIL HCL 2.5 MG/ML IV SOLN
INTRAVENOUS | Status: AC
  Filled 2024-04-07: qty 2

## 2024-04-07 MED ORDER — IRBESARTAN 300 MG PO TABS: 300.0000 mg | ORAL_TABLET | Freq: Every day | ORAL | Status: DC

## 2024-04-07 MED ORDER — ACETAMINOPHEN 325 MG PO TABS
650.0000 mg | ORAL_TABLET | Freq: Four times a day (QID) | ORAL | Status: DC | PRN
Start: 2024-04-07 — End: 2024-04-07

## 2024-04-07 MED ORDER — IOHEXOL 350 MG/ML SOLN
INTRAVENOUS | Status: DC | PRN
  Administered 2024-04-07: 35 mL

## 2024-04-07 MED ORDER — ASPIRIN 81 MG PO TBEC
81.0000 mg | DELAYED_RELEASE_TABLET | Freq: Every day | ORAL | Status: DC
  Administered 2024-04-07 – 2024-04-08 (×2): 81 mg via ORAL
  Filled 2024-04-07 (×2): qty 1

## 2024-04-07 MED ORDER — HEPARIN (PORCINE) 25000 UT/250ML-% IV SOLN
950.0000 [IU]/h | INTRAVENOUS | Status: DC
  Administered 2024-04-07: 750 [IU]/h via INTRAVENOUS
  Filled 2024-04-07: qty 250

## 2024-04-07 MED ORDER — HEPARIN (PORCINE) IN NACL 2-0.9 UNITS/ML
INTRAMUSCULAR | Status: DC | PRN
  Administered 2024-04-07: 10 mL via INTRA_ARTERIAL

## 2024-04-07 MED ORDER — SODIUM CHLORIDE 0.9 % IV SOLN: 250.0000 mL | INTRAVENOUS | Status: DC | PRN

## 2024-04-07 MED ORDER — NITROGLYCERIN 0.4 MG SL SUBL: 0.4000 mg | SUBLINGUAL_TABLET | SUBLINGUAL | Status: DC | PRN

## 2024-04-07 MED ORDER — SODIUM CHLORIDE 0.9 % IV SOLN
INTRAVENOUS | Status: AC | PRN
  Administered 2024-04-07: 10 mL/h via INTRAVENOUS

## 2024-04-07 MED ORDER — METHYLPREDNISOLONE SODIUM SUCC 125 MG IJ SOLR
125.0000 mg | INTRAMUSCULAR | Status: AC
  Administered 2024-04-07: 125 mg via INTRAVENOUS
  Filled 2024-04-07: qty 2

## 2024-04-07 MED ORDER — MIDAZOLAM HCL 2 MG/2ML IJ SOLN
INTRAMUSCULAR | Status: DC | PRN
  Administered 2024-04-07: 1 mg via INTRAVENOUS

## 2024-04-07 MED ORDER — DEXTROSE IN LACTATED RINGERS 5 % IV SOLN: INTRAVENOUS | Status: AC

## 2024-04-07 MED ORDER — METOPROLOL TARTRATE 25 MG PO TABS: 50.0000 mg | ORAL_TABLET | Freq: Two times a day (BID) | ORAL | Status: DC

## 2024-04-07 MED ORDER — LEVOTHYROXINE SODIUM 88 MCG PO TABS
88.0000 ug | ORAL_TABLET | Freq: Every day | ORAL | Status: DC
  Administered 2024-04-08: 88 ug via ORAL
  Filled 2024-04-07 (×2): qty 1

## 2024-04-07 MED ORDER — METOPROLOL TARTRATE 50 MG PO TABS
50.0000 mg | ORAL_TABLET | Freq: Two times a day (BID) | ORAL | Status: DC
  Administered 2024-04-07 – 2024-04-08 (×4): 50 mg via ORAL
  Filled 2024-04-07 (×2): qty 2
  Filled 2024-04-07 (×2): qty 1

## 2024-04-07 MED ORDER — ASPIRIN 81 MG PO CHEW: 81.0000 mg | CHEWABLE_TABLET | ORAL | Status: DC

## 2024-04-07 MED ORDER — SODIUM CHLORIDE 0.9% FLUSH
3.0000 mL | Freq: Two times a day (BID) | INTRAVENOUS | Status: DC
  Administered 2024-04-07: 3 mL via INTRAVENOUS

## 2024-04-07 MED ORDER — ROSUVASTATIN CALCIUM 20 MG PO TABS
20.0000 mg | ORAL_TABLET | Freq: Every day | ORAL | Status: DC
  Administered 2024-04-08: 20 mg via ORAL
  Filled 2024-04-07: qty 1

## 2024-04-07 MED ORDER — IRBESARTAN 300 MG PO TABS: 150.0000 mg | ORAL_TABLET | Freq: Every day | ORAL | Status: DC

## 2024-04-07 MED ORDER — IRBESARTAN 150 MG PO TABS
150.0000 mg | ORAL_TABLET | Freq: Every day | ORAL | Status: DC
  Administered 2024-04-08: 150 mg via ORAL
  Filled 2024-04-07 (×2): qty 1

## 2024-04-07 MED ORDER — SODIUM CHLORIDE 0.9 % IV SOLN: 250.0000 mL | INTRAVENOUS | Status: AC | PRN

## 2024-04-07 MED ORDER — ACETAMINOPHEN 325 MG PO TABS
650.0000 mg | ORAL_TABLET | ORAL | Status: DC | PRN
  Administered 2024-04-07: 650 mg via ORAL
  Filled 2024-04-07: qty 2

## 2024-04-07 MED ORDER — SODIUM CHLORIDE 0.9% FLUSH: 3.0000 mL | INTRAVENOUS | Status: DC | PRN

## 2024-04-07 MED ORDER — FENTANYL CITRATE (PF) 100 MCG/2ML IJ SOLN
INTRAMUSCULAR | Status: AC
Start: 2024-04-07 — End: 2024-04-07
  Filled 2024-04-07: qty 2

## 2024-04-07 MED ORDER — ACETAMINOPHEN 650 MG RE SUPP: 650.0000 mg | Freq: Four times a day (QID) | RECTAL | Status: DC | PRN

## 2024-04-07 MED ORDER — ONDANSETRON HCL 4 MG/2ML IJ SOLN
4.0000 mg | Freq: Four times a day (QID) | INTRAMUSCULAR | Status: DC | PRN
Start: 2024-04-07 — End: 2024-04-08

## 2024-04-07 MED ORDER — HYDRALAZINE HCL 20 MG/ML IJ SOLN: 10.0000 mg | Freq: Four times a day (QID) | INTRAMUSCULAR | Status: DC | PRN

## 2024-04-07 MED ORDER — HEPARIN BOLUS VIA INFUSION
1000.0000 [IU] | Freq: Once | INTRAVENOUS | Status: AC
  Administered 2024-04-07: 1000 [IU] via INTRAVENOUS
  Filled 2024-04-07: qty 1000

## 2024-04-07 MED ORDER — DIPHENHYDRAMINE HCL 50 MG/ML IJ SOLN
50.0000 mg | INTRAMUSCULAR | Status: AC
  Administered 2024-04-07: 50 mg via INTRAVENOUS
  Filled 2024-04-07: qty 1

## 2024-04-07 MED ORDER — ROSUVASTATIN CALCIUM 5 MG PO TABS
5.0000 mg | ORAL_TABLET | Freq: Every day | ORAL | Status: DC
  Administered 2024-04-07: 5 mg via ORAL
  Filled 2024-04-07: qty 1

## 2024-04-07 MED ORDER — HEPARIN (PORCINE) IN NACL 1000-0.9 UT/500ML-% IV SOLN
INTRAVENOUS | Status: DC | PRN
  Administered 2024-04-07: 1000 mL

## 2024-04-07 MED ORDER — FENTANYL CITRATE (PF) 100 MCG/2ML IJ SOLN
INTRAMUSCULAR | Status: DC | PRN
  Administered 2024-04-07: 50 ug via INTRAVENOUS

## 2024-04-07 MED ORDER — HEPARIN SODIUM (PORCINE) 5000 UNIT/ML IJ SOLN: 5000.0000 [IU] | Freq: Three times a day (TID) | INTRAMUSCULAR | Status: DC

## 2024-04-07 MED ORDER — HEPARIN BOLUS VIA INFUSION
4000.0000 [IU] | Freq: Once | INTRAVENOUS | Status: AC
  Administered 2024-04-07: 4000 [IU] via INTRAVENOUS
  Filled 2024-04-07: qty 4000

## 2024-04-07 MED ORDER — LIDOCAINE HCL (PF) 1 % IJ SOLN
INTRAMUSCULAR | Status: AC
  Filled 2024-04-07: qty 30

## 2024-04-07 MED ORDER — LIDOCAINE HCL (PF) 1 % IJ SOLN
INTRAMUSCULAR | Status: DC | PRN
  Administered 2024-04-07: 2 mL

## 2024-04-07 MED ORDER — FAMOTIDINE 20 MG PO TABS: 20.0000 mg | ORAL_TABLET | Freq: Every day | ORAL | Status: DC | PRN

## 2024-04-07 MED ORDER — HEPARIN SODIUM (PORCINE) 1000 UNIT/ML IJ SOLN
INTRAMUSCULAR | Status: AC
  Filled 2024-04-07: qty 10

## 2024-04-07 MED ORDER — LEVALBUTEROL HCL 0.63 MG/3ML IN NEBU: 0.6300 mg | INHALATION_SOLUTION | Freq: Four times a day (QID) | RESPIRATORY_TRACT | Status: DC | PRN

## 2024-04-07 MED ORDER — SODIUM CHLORIDE 0.9% FLUSH
3.0000 mL | Freq: Two times a day (BID) | INTRAVENOUS | Status: DC
  Administered 2024-04-07 – 2024-04-08 (×2): 3 mL via INTRAVENOUS

## 2024-04-07 MED ORDER — HEPARIN SODIUM (PORCINE) 5000 UNIT/ML IJ SOLN
5000.0000 [IU] | Freq: Three times a day (TID) | INTRAMUSCULAR | Status: DC
  Administered 2024-04-08: 5000 [IU] via SUBCUTANEOUS
  Filled 2024-04-07: qty 1

## 2024-04-07 MED ORDER — LABETALOL HCL 5 MG/ML IV SOLN: 10.0000 mg | INTRAVENOUS | Status: AC | PRN

## 2024-04-07 MED ORDER — HYDRALAZINE HCL 20 MG/ML IJ SOLN: 10.0000 mg | INTRAMUSCULAR | Status: AC | PRN

## 2024-04-07 MED ORDER — MIDAZOLAM HCL 2 MG/2ML IJ SOLN
INTRAMUSCULAR | Status: AC
  Filled 2024-04-07: qty 2

## 2024-04-07 MED ORDER — HEPARIN SODIUM (PORCINE) 1000 UNIT/ML IJ SOLN
INTRAMUSCULAR | Status: DC | PRN
  Administered 2024-04-07: 5000 [IU] via INTRAVENOUS

## 2024-04-07 SURGICAL SUPPLY — 8 items
CATH INFINITI AMBI 5FR TG (CATHETERS) IMPLANT
CATH LAUNCHER 6FR EBU 3 (CATHETERS) IMPLANT
DEVICE RAD COMP TR BAND LRG (VASCULAR PRODUCTS) IMPLANT
GLIDESHEATH SLEND A-KIT 6F 22G (SHEATH) IMPLANT
GUIDEWIRE INQWIRE 1.5J.035X260 (WIRE) IMPLANT
KIT SINGLE USE MANIFOLD (KITS) IMPLANT
PACK CARDIAC CATHETERIZATION (CUSTOM PROCEDURE TRAY) ×1 IMPLANT
SET ATX-X65L (MISCELLANEOUS) IMPLANT

## 2024-04-07 NOTE — Progress Notes (Signed)
 Progress Note  Patient Name: Kaitlyn Jimenez Date of Encounter: 04/07/2024  [facility] Cardiologist: Lamar Fitch, MD   Patient Profile     Subjective   Called to see patient who is still having chest pain.  She states she is having 6 out of 10 chest pain.  She tells me it is different from what she had with her MI but is left-sided and radiates up into her neck and throat as well as into her shoulders bilaterally.  No associated diaphoresis but has been nauseated with it.  High-sensitivity opponent mildly elevated but trending upward at 92 now  Inpatient Medications    Scheduled Meds:  [START ON 04/08/2024] aspirin   81 mg Oral Pre-Cath   aspirin  EC  81 mg Oral Daily   diphenhydrAMINE   50 mg Intravenous On Call to OR   famotidine   20 mg Oral BID   free water   500 mL Oral Once   irbesartan   150 mg Oral Daily   levothyroxine   88 mcg Oral QAC breakfast   methylPREDNISolone  (SOLU-MEDROL ) injection  125 mg Intravenous On Call to OR   metoprolol  tartrate  50 mg Oral BID   rosuvastatin   5 mg Oral Daily   sodium chloride  flush  3 mL Intravenous Q12H   sodium chloride  flush  3 mL Intravenous Q12H   Continuous Infusions:  sodium chloride      dextrose  5% lactated ringers  75 mL/hr at 04/07/24 0818   heparin  750 Units/hr (04/07/24 0818)   PRN Meds: sodium chloride , acetaminophen  **OR** acetaminophen , hydrALAZINE , levalbuterol , nitroGLYCERIN , ondansetron  **OR** ondansetron  (ZOFRAN ) IV, sodium chloride  flush   Vital Signs    Vitals:   04/07/24 0720 04/07/24 0812 04/07/24 0947 04/07/24 1102  BP:   (!) 164/80 (!) 167/80  Pulse:   94 75  Resp:    15  Temp: 97.9 F (36.6 C)   98.6 F (37 C)  TempSrc: Oral   Oral  SpO2:  95%  98%  Weight:      Height:       No intake or output data in the 24 hours ending 04/07/24 1156    04/07/2024    4:00 AM 08/31/2023    2:09 PM 02/24/2023    1:47 PM  Last 3 Weights  Weight (lbs) 176 lb 181 lb 9.6 oz 184 lb 6.4 oz  Weight (kg)  79.833 kg 82.373 kg 83.643 kg      Telemetry    Normal sinus rhythm- Personally Reviewed  ECG    Normal sinus rhythm with left bundle branch block unchanged from 08/31/2023- Personally Reviewed  Physical Exam   GEN: No acute distress.   Neck: No JVD Cardiac: RRR, no murmurs, rubs, or gallops.  Respiratory: Clear to auscultation bilaterally. GI: Soft, nontender, non-distended  MS: No edema; No deformity. Neuro:  Nonfocal  Psych: Normal affect   Labs    High Sensitivity Troponin:   Recent Labs  Lab 04/06/24 2206 04/06/24 2359 04/07/24 0326 04/07/24 0717  TROPONINIHS 14 48* 68* 92*      Chemistry Recent Labs  Lab 04/06/24 2206 04/07/24 0327  NA 128* 131*  K 4.1 4.2  CL 98 99  CO2 22 23  GLUCOSE 110* 118*  BUN 9 9  CREATININE 0.86 0.90  CALCIUM  8.9 9.1  PROT  --  6.8  ALBUMIN  --  3.4*  AST  --  20  ALT  --  12  ALKPHOS  --  116  BILITOT  --  0.8  GFRNONAA >60 >60  ANIONGAP 8 9     Hematology Recent Labs  Lab 04/06/24 2206 04/07/24 0327  WBC 9.2 11.8*  RBC 4.34 4.37  HGB 12.7 12.8  HCT 39.4 39.5  MCV 90.8 90.4  MCH 29.3 29.3  MCHC 32.2 32.4  RDW 12.5 12.5  PLT 301 273    BNP Recent Labs  Lab 04/06/24 2359  BNP 89.9     DDimer No results for input(s): DDIMER in the last 168 hours.   CHA2DS2-VASc Score = 4   This indicates a 4.8% annual risk of stroke. The patient's score is based upon: CHF History: 0 HTN History: 1 Diabetes History: 0 Stroke History: 0 Vascular Disease History: 1 Age Score: 1 Gender Score: 1      Radiology    CT CHEST WO CONTRAST Result Date: 04/07/2024 CLINICAL DATA:  Chest pain for 2 days EXAM: CT CHEST WITHOUT CONTRAST TECHNIQUE: Multidetector CT imaging of the chest was performed following the standard protocol without IV contrast. RADIATION DOSE REDUCTION: This exam was performed according to the departmental dose-optimization program which includes automated exposure control, adjustment of the mA  and/or kV according to patient size and/or use of iterative reconstruction technique. COMPARISON:  Plain film from the previous day. FINDINGS: Cardiovascular: Atherosclerotic calcifications of the thoracic aorta are noted. No aneurysmal dilatation is seen. No cardiac enlargement is noted. Coronary calcification and stenting is noted. No pericardial effusion is seen. Mediastinum/Nodes: Thoracic inlet is within normal limits. Scattered small mediastinal nodes are seen. No sizable adenopathy is noted. The esophagus as visualized is within normal limits. Lungs/Pleura: Lungs are well aerated bilaterally. Diffuse emphysematous changes and mosaic attenuation is noted. Mild subpleural fibrosis is noted in the upper lobes. Biapical pleural and parenchymal scarring is seen. Minimal right-sided pleural effusion is seen. Upper Abdomen: Gallbladder has been surgically removed. No acute upper abdominal abnormality is seen. Musculoskeletal: Postsurgical changes in the lower cervical spine are seen. No acute rib abnormality is noted. No compression deformity is seen. IMPRESSION: Chronic changes without acute abnormality. Aortic Atherosclerosis (ICD10-I70.0) and Emphysema (ICD10-J43.9). Electronically Signed   By: Oneil Devonshire M.D.   On: 04/07/2024 03:53   DG Chest 2 View Result Date: 04/06/2024 CLINICAL DATA:  Chest pain EXAM: CHEST - 2 VIEW COMPARISON:  Chest x-ray 03/01/2019 FINDINGS: There are increased interstitial markings diffusely throughout both lungs. There is no lung consolidation, pleural effusion or pneumothorax. The cardiomediastinal silhouette is within normal limits. No acute fractures are seen. Cervical spinal fusion plate is present. IMPRESSION: Increased interstitial markings diffusely throughout both lungs, which may represent pulmonary edema or atypical infection. Electronically Signed   By: Greig Pique M.D.   On: 04/06/2024 23:27    Patient Profile     73 y.o. female  with CAD with PCI (2007, LAD  2013, pRCA and LAD in 02/2019), HLD, HTN, paroxysmal AF who is being seen today for the evaluation of chest pain at the request of Lynwood Roosevelt, [place].   Assessment & Plan    NSTEMI ASCAD Chronic left bundle branch block Hyperlipidemia - Patient has a remote history of PCI 2007, LAD in 2013 and then PCI of the proximal RCA and LAD in 2020 - Presented with 24-hour history of intermittent chest pain in the setting of elevated BP at initially 200/102 mmHg.  Pain has become more persistent over the past 24 hours and currently is 6 out of 10 radiating up into her left shoulder and to her throat. - High-sensitivity troponin mildly elevated  at 14>> 48>> 68>> 92 and given the upward trend this is consistent with ACS - Pain somewhat different from what she had in the past but still very worrisome for ischemic origin - EKG shows chronic left bundle branch block unchanged from January 2025 - She has equal pulses distally and no evidence of aortic aneurysm on noncontrasted chest CT so do not think this is aortic dissection - Plan is for left heart cath today with possible PCI - She does have a significant contrast dye allergy and has already been given 2 doses of prednisone  50 mg each - Will give 125 mg IV Solu-Medrol  and Benadryl  50 mg IV on-call to the Cath Lab -Informed Consent   Shared Decision Making/Informed Consent The risks [stroke (1 in 1000), death (1 in 1000), kidney failure [usually temporary] (1 in 500), bleeding (1 in 200), allergic reaction [possibly serious] (1 in 200)], benefits (diagnostic support and management of coronary artery disease) and alternatives of a cardiac catheterization were discussed in detail with Ms. Blizzard and she is willing to proceed. - Continue IV heparin  drip, aspirin  81 mg daily and Lopressor  50 mg twice daily - Lipids this admission showed LDL 155, HDL 47 and triglycerides 78 - Increase Crestor  to 20 mg daily - Will need repeat FLP and ALT in 6 weeks - Check  hemoglobin A1c  Paroxysmal atrial fibrillation - Status post remote A-fib ablation  - maintaining normal sinus rhythm on exam - CHA2DS2-VASc score 4 (age greater than 65, hypertension, CAD, female)>> refused anticoagulation in the past  Hypertension - Hypertensive urgency with BP high as 200/102 mmHg over the past 24 hours - BP currently 167/80 mmHg - Continue Lopressor  50 mg twice daily - She is on valsartan  160 mg daily at home which we will increase to 320 mg daily post cath   I spent 30 minutes caring for this patient today face to face, ordering and reviewing labs, reviewing records from admission notes, prior cath in 2020, echo from 03/28/2022, seeing the patient, documenting in the record, and arranging for a left heart cath with possible PCI and 2D echo and discussing risks and benefits of cardiac cath          For questions or updates, please contact [facility] Please consult [email or web address] for contact info under        Signed, Wilbert Bihari, MD  04/07/2024,

## 2024-04-07 NOTE — ED Notes (Signed)
 Consent signed. Heparin  placed on hold. Pt transported to Cath Lab.

## 2024-04-07 NOTE — Interval H&P Note (Signed)
 History and Physical Interval Note:  04/07/2024 5:15 PM  Kaitlyn Jimenez  has presented today for surgery, with the diagnosis of nstemi.  The various methods of treatment have been discussed with the patient and family. After consideration of risks, benefits and other options for treatment, the patient has consented to  Procedure(s): LEFT HEART CATH AND CORONARY ANGIOGRAPHY (N/A) as a surgical intervention.  The patient's history has been reviewed, patient examined, no change in status, stable for surgery.  I have reviewed the patient's chart and labs.  Questions were answered to the patient's satisfaction.     Kaylee Trivett J Reiner Loewen

## 2024-04-07 NOTE — Progress Notes (Signed)
 PHARMACY - ANTICOAGULATION CONSULT NOTE  Pharmacy Consult for heparin  Indication: chest pain/ACS  Patient Measurements: Height: 5' 1 (154.9 cm) Weight: 79.8 kg (176 lb) IBW/kg (Calculated) : 47.8 HEPARIN  DW (KG): 65.8  Vital Signs: Temp: 98.6 F (37 C) (08/14 1102) Temp Source: Oral (08/14 1102) BP: 167/80 (08/14 1102) Pulse Rate: 75 (08/14 1102)  Labs: Recent Labs    04/06/24 2206 04/06/24 2359 04/07/24 0326 04/07/24 0327 04/07/24 0717 04/07/24 1429  HGB 12.7  --   --  12.8  --   --   HCT 39.4  --   --  39.5  --   --   PLT 301  --   --  273  --   --   APTT  --   --   --  30  --   --   LABPROT  --   --   --  13.5  --   --   INR  --   --   --  1.0  --   --   HEPARINUNFRC  --   --   --   --   --  <0.10*  CREATININE 0.86  --   --  0.90  --   --   TROPONINIHS 14 48* 68*  --  92*  --     Estimated Creatinine Clearance: 53.3 mL/min (by C-G formula based on SCr of 0.9 mg/dL).   Medical History: Past Medical History:  Diagnosis Date   Atypical chest pain 01/07/2019   Coronary artery disease involving native coronary artery of native heart with angina pectoris (HCC) 07/13/2017   Stenting in 2007, 2013 to LAD, last cardiac cath in 02/2019 w/ prox RCA stent and 50% ISR of LAD stent   Dyslipidemia 07/13/2017   Essential hypertension 07/13/2017   Gastroesophageal reflux disease without esophagitis 05/25/2020   Hypertension    MI (myocardial infarction) (HCC)    MI (myocardial infarction) (HCC)    Other hyperlipidemia 05/25/2020   Other specified hypothyroidism 05/25/2020   Primary hypertension 05/25/2020   Thyroid  disease    Unstable angina (HCC) 03/01/2019    Assessment: 17 yoF presented to ED with chest pain. PMH includes CAD (s/p multiple stents with most recent in 2020), HTN, HLD, hx of afib (s/p ablation in 2022 remaining in sinus rhythm- stopped Xarelto  at that time) and hypothyroidism. Pharmacy consulted to dose heparin  for ACS.  -CBC stable -No oral anticoagulation  reported -Trops 48  Heparin  level undetectable on 750 units/hr. No issues with infusion or overt s/sx of bleeding reported.   Goal of Therapy:  Heparin  level 0.3-0.7 units/ml Monitor platelets by anticoagulation protocol: Yes   Plan:  Give 1000 units bolus x 1 Increase heparin  infusion to 950 units/hr Check anti-Xa level in 8 hours and daily while on heparin  Continue to monitor H&H and platelets F/u cards consult and plans for [facility]   Koren Or, PharmD Clinical Pharmacist 04/07/2024 3:19 PM Please check AMION for all [facility] Pharmacy numbers

## 2024-04-07 NOTE — H&P (View-Only) (Signed)
 Progress Note  Patient Name: Kaitlyn Jimenez Date of Encounter: 04/07/2024  [facility] Cardiologist: Lamar Fitch, MD   Patient Profile     Subjective   Called to see patient who is still having chest pain.  She states she is having 6 out of 10 chest pain.  She tells me it is different from what she had with her MI but is left-sided and radiates up into her neck and throat as well as into her shoulders bilaterally.  No associated diaphoresis but has been nauseated with it.  High-sensitivity opponent mildly elevated but trending upward at 92 now  Inpatient Medications    Scheduled Meds:  [START ON 04/08/2024] aspirin   81 mg Oral Pre-Cath   aspirin  EC  81 mg Oral Daily   diphenhydrAMINE   50 mg Intravenous On Call to OR   famotidine   20 mg Oral BID   free water   500 mL Oral Once   irbesartan   150 mg Oral Daily   levothyroxine   88 mcg Oral QAC breakfast   methylPREDNISolone  (SOLU-MEDROL ) injection  125 mg Intravenous On Call to OR   metoprolol  tartrate  50 mg Oral BID   rosuvastatin   5 mg Oral Daily   sodium chloride  flush  3 mL Intravenous Q12H   sodium chloride  flush  3 mL Intravenous Q12H   Continuous Infusions:  sodium chloride      dextrose  5% lactated ringers  75 mL/hr at 04/07/24 0818   heparin  750 Units/hr (04/07/24 0818)   PRN Meds: sodium chloride , acetaminophen  **OR** acetaminophen , hydrALAZINE , levalbuterol , nitroGLYCERIN , ondansetron  **OR** ondansetron  (ZOFRAN ) IV, sodium chloride  flush   Vital Signs    Vitals:   04/07/24 0720 04/07/24 0812 04/07/24 0947 04/07/24 1102  BP:   (!) 164/80 (!) 167/80  Pulse:   94 75  Resp:    15  Temp: 97.9 F (36.6 C)   98.6 F (37 C)  TempSrc: Oral   Oral  SpO2:  95%  98%  Weight:      Height:       No intake or output data in the 24 hours ending 04/07/24 1156    04/07/2024    4:00 AM 08/31/2023    2:09 PM 02/24/2023    1:47 PM  Last 3 Weights  Weight (lbs) 176 lb 181 lb 9.6 oz 184 lb 6.4 oz  Weight (kg)  79.833 kg 82.373 kg 83.643 kg      Telemetry    Normal sinus rhythm- Personally Reviewed  ECG    Normal sinus rhythm with left bundle branch block unchanged from 08/31/2023- Personally Reviewed  Physical Exam   GEN: No acute distress.   Neck: No JVD Cardiac: RRR, no murmurs, rubs, or gallops.  Respiratory: Clear to auscultation bilaterally. GI: Soft, nontender, non-distended  MS: No edema; No deformity. Neuro:  Nonfocal  Psych: Normal affect   Labs    High Sensitivity Troponin:   Recent Labs  Lab 04/06/24 2206 04/06/24 2359 04/07/24 0326 04/07/24 0717  TROPONINIHS 14 48* 68* 92*      Chemistry Recent Labs  Lab 04/06/24 2206 04/07/24 0327  NA 128* 131*  K 4.1 4.2  CL 98 99  CO2 22 23  GLUCOSE 110* 118*  BUN 9 9  CREATININE 0.86 0.90  CALCIUM  8.9 9.1  PROT  --  6.8  ALBUMIN  --  3.4*  AST  --  20  ALT  --  12  ALKPHOS  --  116  BILITOT  --  0.8  GFRNONAA >60 >60  ANIONGAP 8 9     Hematology Recent Labs  Lab 04/06/24 2206 04/07/24 0327  WBC 9.2 11.8*  RBC 4.34 4.37  HGB 12.7 12.8  HCT 39.4 39.5  MCV 90.8 90.4  MCH 29.3 29.3  MCHC 32.2 32.4  RDW 12.5 12.5  PLT 301 273    BNP Recent Labs  Lab 04/06/24 2359  BNP 89.9     DDimer No results for input(s): DDIMER in the last 168 hours.   CHA2DS2-VASc Score = 4   This indicates a 4.8% annual risk of stroke. The patient's score is based upon: CHF History: 0 HTN History: 1 Diabetes History: 0 Stroke History: 0 Vascular Disease History: 1 Age Score: 1 Gender Score: 1      Radiology    CT CHEST WO CONTRAST Result Date: 04/07/2024 CLINICAL DATA:  Chest pain for 2 days EXAM: CT CHEST WITHOUT CONTRAST TECHNIQUE: Multidetector CT imaging of the chest was performed following the standard protocol without IV contrast. RADIATION DOSE REDUCTION: This exam was performed according to the departmental dose-optimization program which includes automated exposure control, adjustment of the mA  and/or kV according to patient size and/or use of iterative reconstruction technique. COMPARISON:  Plain film from the previous day. FINDINGS: Cardiovascular: Atherosclerotic calcifications of the thoracic aorta are noted. No aneurysmal dilatation is seen. No cardiac enlargement is noted. Coronary calcification and stenting is noted. No pericardial effusion is seen. Mediastinum/Nodes: Thoracic inlet is within normal limits. Scattered small mediastinal nodes are seen. No sizable adenopathy is noted. The esophagus as visualized is within normal limits. Lungs/Pleura: Lungs are well aerated bilaterally. Diffuse emphysematous changes and mosaic attenuation is noted. Mild subpleural fibrosis is noted in the upper lobes. Biapical pleural and parenchymal scarring is seen. Minimal right-sided pleural effusion is seen. Upper Abdomen: Gallbladder has been surgically removed. No acute upper abdominal abnormality is seen. Musculoskeletal: Postsurgical changes in the lower cervical spine are seen. No acute rib abnormality is noted. No compression deformity is seen. IMPRESSION: Chronic changes without acute abnormality. Aortic Atherosclerosis (ICD10-I70.0) and Emphysema (ICD10-J43.9). Electronically Signed   By: Oneil Devonshire M.D.   On: 04/07/2024 03:53   DG Chest 2 View Result Date: 04/06/2024 CLINICAL DATA:  Chest pain EXAM: CHEST - 2 VIEW COMPARISON:  Chest x-ray 03/01/2019 FINDINGS: There are increased interstitial markings diffusely throughout both lungs. There is no lung consolidation, pleural effusion or pneumothorax. The cardiomediastinal silhouette is within normal limits. No acute fractures are seen. Cervical spinal fusion plate is present. IMPRESSION: Increased interstitial markings diffusely throughout both lungs, which may represent pulmonary edema or atypical infection. Electronically Signed   By: Greig Pique M.D.   On: 04/06/2024 23:27    Patient Profile     73 y.o. female  with CAD with PCI (2007, LAD  2013, pRCA and LAD in 02/2019), HLD, HTN, paroxysmal AF who is being seen today for the evaluation of chest pain at the request of Lynwood Roosevelt, [place].   Assessment & Plan    NSTEMI ASCAD Chronic left bundle branch block Hyperlipidemia - Patient has a remote history of PCI 2007, LAD in 2013 and then PCI of the proximal RCA and LAD in 2020 - Presented with 24-hour history of intermittent chest pain in the setting of elevated BP at initially 200/102 mmHg.  Pain has become more persistent over the past 24 hours and currently is 6 out of 10 radiating up into her left shoulder and to her throat. - High-sensitivity troponin mildly elevated  at 14>> 48>> 68>> 92 and given the upward trend this is consistent with ACS - Pain somewhat different from what she had in the past but still very worrisome for ischemic origin - EKG shows chronic left bundle branch block unchanged from January 2025 - She has equal pulses distally and no evidence of aortic aneurysm on noncontrasted chest CT so do not think this is aortic dissection - Plan is for left heart cath today with possible PCI - She does have a significant contrast dye allergy and has already been given 2 doses of prednisone  50 mg each - Will give 125 mg IV Solu-Medrol  and Benadryl  50 mg IV on-call to the Cath Lab -Informed Consent   Shared Decision Making/Informed Consent The risks [stroke (1 in 1000), death (1 in 1000), kidney failure [usually temporary] (1 in 500), bleeding (1 in 200), allergic reaction [possibly serious] (1 in 200)], benefits (diagnostic support and management of coronary artery disease) and alternatives of a cardiac catheterization were discussed in detail with Ms. Blizzard and she is willing to proceed. - Continue IV heparin  drip, aspirin  81 mg daily and Lopressor  50 mg twice daily - Lipids this admission showed LDL 155, HDL 47 and triglycerides 78 - Increase Crestor  to 20 mg daily - Will need repeat FLP and ALT in 6 weeks - Check  hemoglobin A1c  Paroxysmal atrial fibrillation - Status post remote A-fib ablation  - maintaining normal sinus rhythm on exam - CHA2DS2-VASc score 4 (age greater than 65, hypertension, CAD, female)>> refused anticoagulation in the past  Hypertension - Hypertensive urgency with BP high as 200/102 mmHg over the past 24 hours - BP currently 167/80 mmHg - Continue Lopressor  50 mg twice daily - She is on valsartan  160 mg daily at home which we will increase to 320 mg daily post cath   I spent 30 minutes caring for this patient today face to face, ordering and reviewing labs, reviewing records from admission notes, prior cath in 2020, echo from 03/28/2022, seeing the patient, documenting in the record, and arranging for a left heart cath with possible PCI and 2D echo and discussing risks and benefits of cardiac cath          For questions or updates, please contact [facility] Please consult [email or web address] for contact info under        Signed, Wilbert Bihari, MD  04/07/2024,

## 2024-04-07 NOTE — Progress Notes (Signed)
   Patient seen and examined at bedside, patient admitted after midnight, please see earlier detailed admission note by Micaela Speaker, MD. Briefly, patient presented secondary to chest pain with concern for NSTEMI  Subjective: Patient reports ongoing chest pain located substernally that is worse with mobility.  BP (!) 157/63 (BP Location: Right Arm)   Pulse 82   Temp 98.5 F (36.9 C) (Oral)   Resp 20   Ht 5' 1 (1.549 m)   Wt 79.8 kg   SpO2 97%   BMI 33.25 kg/m   General exam: Appears calm and comfortable Respiratory system: Clear to auscultation. Respiratory effort normal. Cardiovascular system: S1 & S2 heard, RRR.  Gastrointestinal system: Abdomen is nondistended, soft and nontender. Normal bowel sounds heard. Central nervous system: Alert and oriented. No focal neurological deficits. Musculoskeletal: No edema. No calf tenderness Psychiatry: Judgement and insight appear normal. Mood & affect appropriate.   Brief assessment/Plan:  NSTEMI Patient with chest pain and elevated troponin. Significant CAD history. Cardiology consulted and plan for cardiac catheterization this admission. Heparin  IV started. -Cardiology recommendations: cardiac cath  Acute respiratory failure with hypoxia Present on admission with patient requiring supplemental oxygen. Possible cardiac etiology. CT imaging without abnormality. Patient weaned to room air.  Primary hypertension -Continue metoprolol  and irbesartan   Chronic HFpEF Stable.  Paroxysmal atrial fibrillation History of ablation. Not on anticoagulation. Currently in sinus rhythm. -Continue metoprolol   Hypothyroidism -Continue Synthroid   PAD -Continue aspirin  and Crestor   Hyponatremia Patient is euvolemic. Sodium of 128 on admission and improved to 131.  Family communication: Sisters at bedside DVT prophylaxis: Heparin  IV Disposition: Discharge home pending ongoing cardiology recommendations  Elgin Lam, MD [facility] 04/07/2024, 3:22 PM

## 2024-04-07 NOTE — ED Notes (Signed)
 Pt denies any chest pain at this time. Ambulated down the hall to the bathroom with this RN at her side.

## 2024-04-07 NOTE — Consult Note (Signed)
 Cardiology Consultation:   Patient ID: Kaitlyn Jimenez MRN: [id number]; DOB: 1951/03/15  Admit date: 04/06/2024 Date of Consult: 04/07/2024  Primary Care Provider: Pandora Therisa RAMAN, NP [facility] Cardiologist: Kaitlyn Fitch, MD  [facility] Electrophysiologist:  Kaitlyn Jimenez Norton, MD   Patient Profile:   Kaitlyn Jimenez is a 73 y.o. female with CAD with PCI (2007, LAD 2013, pRCA and LAD in 02/2019), HLD, HTN, paroxysmal AF who is being seen today for the evaluation of chest pain at the request of Kaitlyn Jimenez, [place].   History of Present Illness:   Kaitlyn Jimenez experienced chest discomfort on and off yesterday (08/13) and she subsequently took her blood pressure which was 200/102. This degree of hypertension is very abnormal for her.  She had noticed some pain over the past 2 days but then started to worry more about it as it became more persistent and more severe.  She describes the pain prior to presentation as 10/10 severity in the center of her chest with radiation to her left arm and throat.  The previous episodes that she experienced prior to similar episodes in the past was much more severe (100/10) with radiation to her L arm alone.   During my evaluation she still had mild chest discomfort but otherwise no complaints.  VS: BP 165/71 (98), P 95, O2 99%/RA, RR 23.   Evaluation notable for initial hsT WNLs (14) that increased to 48->68.   Past Medical History:  Diagnosis Date   Atypical chest pain 01/07/2019   Coronary artery disease involving native coronary artery of native heart with angina pectoris (HCC) 07/13/2017   Stenting in 2007, 2013 to LAD, last cardiac cath in 02/2019 w/ prox RCA stent and 50% ISR of LAD stent   Dyslipidemia 07/13/2017   Essential hypertension 07/13/2017   Gastroesophageal reflux disease without esophagitis 05/25/2020   Hypertension    MI (myocardial infarction) (HCC)    MI (myocardial infarction) (HCC)    Other hyperlipidemia 05/25/2020    Other specified hypothyroidism 05/25/2020   Primary hypertension 05/25/2020   Thyroid  disease    Unstable angina (HCC) 03/01/2019   Past Surgical History:  Procedure Laterality Date   ABDOMINAL HYSTERECTOMY     ATRIAL FIBRILLATION ABLATION N/A 06/21/2021   Procedure: ATRIAL FIBRILLATION ABLATION;  Surgeon: Jimenez Soyla Gladis, MD;  Location: [facility] INVASIVE CV LAB;  Service: Cardiovascular;  Laterality: N/A;   CARDIAC CATHETERIZATION     CHOLECYSTECTOMY     CORONARY ANGIOPLASTY WITH STENT PLACEMENT     LEFT HEART CATH AND CORONARY ANGIOGRAPHY N/A 03/02/2019   Procedure: LEFT HEART CATH AND CORONARY ANGIOGRAPHY;  Surgeon: Wonda Sharper, MD;  Location: [facility] INVASIVE CV LAB;  Service: Cardiovascular;  Laterality: N/A;    Home Medications:  Prior to Admission medications   Medication Sig Start Date End Date Taking? Authorizing Provider  ALPRAZolam  (XANAX ) 1 MG tablet Take 1 mg by mouth 2 (two) times daily.   Yes [provider]  aspirin  EC 81 MG tablet Take 1 tablet (81 mg total) by mouth daily. Swallow whole. 05/02/21  Yes Camnitz, Soyla Gladis, MD  cyanocobalamin  (,VITAMIN B-12,) 1000 MCG/ML injection Inject 1,000 mcg into the muscle every 30 (thirty) days.   Yes [provider]  famotidine  (PEPCID ) 20 MG tablet Take 20 mg by mouth daily as needed for heartburn or indigestion.   Yes [provider]  fluticasone (FLONASE) 50 MCG/ACT nasal spray Place 1 spray into both nostrils daily as needed for allergies. 03/23/20  Yes [provider]  levothyroxine  (SYNTHROID ) 88 MCG tablet Take 88 mcg by mouth daily before breakfast.   Yes [provider]  metoprolol  tartrate (LOPRESSOR ) 50 MG tablet Take 1 tablet (50 mg total) by mouth 2 (two) times daily. 01/06/19  Yes Krasowski, Robert J, MD  nitroGLYCERIN  (NITROSTAT ) 0.4 MG SL tablet Place 1 tablet (0.4 mg total) under the tongue every 5 (five) minutes as needed for chest pain. 03/03/19  Yes Marcine Catalan M, PA-C   polyvinyl alcohol (LIQUIFILM TEARS) 1.4 % ophthalmic solution Place 1 drop into both eyes as needed for dry eyes.   Yes [provider]  rosuvastatin  (CRESTOR ) 5 MG tablet Take 10 mg by mouth daily. 08/03/23  Yes [provider]  sodium chloride  (OCEAN) 0.65 % SOLN nasal spray Place 1 spray into both nostrils as needed for congestion.   Yes [provider]  triamcinolone  ointment (KENALOG ) 0.1 % Apply 1 application topically as needed (demititis). 01/19/20  Yes [provider]  valsartan  (DIOVAN ) 160 MG tablet Take 1 tablet (160 mg total) by mouth daily. 02/23/20  Yes Krasowski, Robert J, MD  Vitamin D, Ergocalciferol, (DRISDOL) 1.25 MG (50000 UT) CAPS capsule Take 50,000 Units by mouth every Wednesday.   Yes [provider]  EPINEPHrine 0.3 mg/0.3 mL IJ SOAJ injection Inject 0.3 mg into the muscle as needed for anaphylaxis.    [provider]    Inpatient Medications: Scheduled Meds:  aspirin  EC  81 mg Oral Daily   famotidine   20 mg Oral BID   irbesartan   150 mg Oral Daily   levothyroxine   88 mcg Oral QAC breakfast   metoprolol  tartrate  50 mg Oral BID   predniSONE   50 mg Oral Once   rosuvastatin   5 mg Oral Daily   sodium chloride  flush  3 mL Intravenous Q12H   sodium chloride  flush  3 mL Intravenous Q12H   Continuous Infusions:  sodium chloride      dextrose  5% lactated ringers  75 mL/hr at 04/07/24 0513   heparin  750 Units/hr (04/07/24 0511)   PRN Meds: sodium chloride , acetaminophen  **OR** acetaminophen , hydrALAZINE , levalbuterol , nitroGLYCERIN , ondansetron  **OR** ondansetron  (ZOFRAN ) IV, sodium chloride  flush  Allergies:    Allergies  Allergen Reactions   Bee Venom Anaphylaxis   Penciclovir Hives   Shellfish-Derived Products Anaphylaxis    Breathing difficulty   Sulfa Antibiotics Hives and Palpitations   Brilinta  [Ticagrelor ]     Shortness of breath   Eliquis  [Apixaban ] Other (See Comments)    Caused severe headaches,  blurred vision   Prasugrel  Other (See Comments)    Hives    Iodinated Contrast Media Swelling, Palpitations and Rash    Breathing difficulty Breaks out also  Hx of breakthru reaction - radiology advises against use of contrast despite steroid/antihistamine prep Lips swell   Penicillins Hives and Rash    Did it involve swelling of the face/tongue/throat, SOB, or low BP? No Did it involve sudden or severe rash/hives, skin peeling, or any reaction on the inside of your mouth or nose? Yes Did you need to seek medical attention at a hospital or doctor's office? No When did it last happen?    Pt was a child    If all above answers are "NO", may proceed with cephalosporin use.  Pt states that she also passes out    Ramipril Nausea And Vomiting and Other (See Comments)    pancreatis **Altace**   Social History:   Social History   Socioeconomic History   Marital status:  Married    Spouse name: Not on file   Number of children: Not on file   Years of education: Not on file   Highest education level: Not on file  Occupational History   Not on file  Tobacco Use   Smoking status: Former   Smokeless tobacco: Never  Vaping Use   Vaping status: Never Used  Substance and Sexual Activity   Alcohol use: No   Drug use: No   Sexual activity: Not on file  Other Topics Concern   Not on file  Social History Narrative   Not on file   Social Drivers of Health   Financial Resource Strain: Not on file  Food Insecurity: Not on file  Transportation Needs: Not on file  Physical Activity: Not on file  Stress: Not on file  Social Connections: Not on file  Intimate Partner Violence: Not on file    Family History:    Family History  Problem Relation Age of Onset   Heart disease Mother    Heart attack Father    Heart attack Brother    Congestive Heart Failure Brother    Rheum arthritis Brother    Arrhythmia Sister    Heart disease Maternal Uncle    Heart disease Paternal Uncle      ROS:  Review of Systems: [y] = yes, [ ]  = no      General: Weight gain [ ] ; Weight loss [ ] ; Anorexia [ ] ; Fatigue [ ] ; Fever [ ] ; Chills [ ] ; Weakness [ ]    Cardiac: Chest pain/pressure [y]; Resting SOB [ ] ; Exertional SOB [ ] ; Orthopnea [ ] ; Pedal Edema [ ] ; Palpitations [ ] ; Syncope [ ] ; Presyncope [ ] ; Paroxysmal nocturnal dyspnea [ ]    Pulmonary: Cough [ ] ; Wheezing [ ] ; Hemoptysis [ ] ; Sputum [ ] ; Snoring [ ]    GI: Vomiting [ ] ; Dysphagia [ ] ; Melena [ ] ; Hematochezia [ ] ; Heartburn [ ] ; Abdominal pain [ ] ; Constipation [ ] ; Diarrhea [ ] ; BRBPR [ ]    GU: Hematuria [ ] ; Dysuria [ ] ; Nocturia [ ]  Vascular: Pain in legs with walking [ ] ; Pain in feet with lying flat [ ] ; Non-healing sores [ ] ; Stroke [ ] ; TIA [ ] ; Slurred speech [ ] ;   Neuro: Headaches [ ] ; Vertigo [ ] ; Seizures [ ] ; Paresthesias [ ] ;Blurred vision [ ] ; Diplopia [ ] ; Vision changes [ ]    Ortho/Skin: Arthritis [ ] ; Joint pain [ ] ; Muscle pain [ ] ; Joint swelling [ ] ; Back Pain [ ] ; Rash [ ]    Psych: Depression [ ] ; Anxiety [ ]    Heme: Bleeding problems [ ] ; Clotting disorders [ ] ; Anemia [ ]    Endocrine: Diabetes [ ] ; Thyroid  dysfunction [ ]    Physical Exam/Data:   Vitals:   04/07/24 0327 04/07/24 0400 04/07/24 0445 04/07/24 0515  BP: (!) 176/78  136/61 (!) 152/69  Pulse: 98  73 77  Resp:    (!) 23  Temp:      TempSrc:      SpO2:   94% 97%  Weight:  79.8 kg    Height:  5' 1 (1.549 m)     No intake or output data in the 24 hours ending 04/07/24 0637    04/07/2024    4:00 AM 08/31/2023    2:09 PM 02/24/2023    1:47 PM  Last 3 Weights  Weight (lbs) 176 lb 181 lb 9.6 oz 184 lb 6.4 oz  Weight (kg)  79.833 kg 82.373 kg 83.643 kg     Body mass index is 33.25 kg/m.  General:  Well nourished, well developed, in no acute distress HEENT: normal Neck: no JVD Vascular: No carotid bruits; FA pulses 2+ bilaterally without bruits  Cardiac:  normal S1, S2; RRR; no murmur  Lungs:  clear to auscultation bilaterally, no  wheezing, rhonchi or rales  Abd: soft, nontender, no hepatomegaly  Ext: no edema Musculoskeletal:  No deformities, BUE and BLE strength normal and equal Skin: warm and dry  Neuro:  CNs 2-12 intact, no focal abnormalities noted Psych:  Normal affect   EKG:  The EKG (04/06/24, 22:01:58) was personally reviewed and demonstrates: NSR 81, PR 168, QRS 136, QT/c 400/464, LBBB, no ischemic changes  Telemetry:  Telemetry was personally reviewed and demonstrates: NSR 80s   Relevant CV Studies:  TTE Result date: 04/17/22  1. Left ventricular ejection fraction, by estimation, is 60 to 65%. The  left ventricle has normal function. The left ventricle has no regional  wall motion abnormalities. There is mild left ventricular hypertrophy.  Left ventricular diastolic parameters  are consistent with Grade I diastolic dysfunction (impaired relaxation).   2. Right ventricular systolic function is normal. The right ventricular  size is normal. There is normal pulmonary artery systolic pressure.   3. Left atrial size was mildly dilated.   4. The mitral valve is normal in structure. No evidence of mitral valve  regurgitation. No evidence of mitral stenosis.   5. The aortic valve is normal in structure. Aortic valve regurgitation is  not visualized. No aortic stenosis is present.   6. The inferior vena cava is normal in size with greater than 50%  respiratory variability, suggesting right atrial pressure of 3 mmHg.   Laboratory Data:  High Sensitivity Troponin:   Recent Labs  Lab 04/06/24 2206 04/06/24 2359 04/07/24 0326  TROPONINIHS 14 48* 68*     Chemistry Recent Labs  Lab 04/06/24 2206 04/07/24 0327  NA 128* 131*  K 4.1 4.2  CL 98 99  CO2 22 23  GLUCOSE 110* 118*  BUN 9 9  CREATININE 0.86 0.90  CALCIUM  8.9 9.1  GFRNONAA >60 >60  ANIONGAP 8 9    Recent Labs  Lab 04/07/24 0327  PROT 6.8  ALBUMIN 3.4*  AST 20  ALT 12  ALKPHOS 116  BILITOT 0.8   Hematology Recent Labs  Lab  04/06/24 2206 04/07/24 0327  WBC 9.2 11.8*  RBC 4.34 4.37  HGB 12.7 12.8  HCT 39.4 39.5  MCV 90.8 90.4  MCH 29.3 29.3  MCHC 32.2 32.4  RDW 12.5 12.5  PLT 301 273   BNP Recent Labs  Lab 04/06/24 2359  BNP 89.9   Radiology/Studies:  CT CHEST WO CONTRAST Result Date: 04/07/2024 CLINICAL DATA:  Chest pain for 2 days EXAM: CT CHEST WITHOUT CONTRAST TECHNIQUE: Multidetector CT imaging of the chest was performed following the standard protocol without IV contrast. RADIATION DOSE REDUCTION: This exam was performed according to the departmental dose-optimization program which includes automated exposure control, adjustment of the mA and/or kV according to patient size and/or use of iterative reconstruction technique. COMPARISON:  Plain film from the previous day. FINDINGS: Cardiovascular: Atherosclerotic calcifications of the thoracic aorta are noted. No aneurysmal dilatation is seen. No cardiac enlargement is noted. Coronary calcification and stenting is noted. No pericardial effusion is seen. Mediastinum/Nodes: Thoracic inlet is within normal limits. Scattered small mediastinal nodes are seen. No sizable adenopathy is noted. The esophagus as visualized  is within normal limits. Lungs/Pleura: Lungs are well aerated bilaterally. Diffuse emphysematous changes and mosaic attenuation is noted. Mild subpleural fibrosis is noted in the upper lobes. Biapical pleural and parenchymal scarring is seen. Minimal right-sided pleural effusion is seen. Upper Abdomen: Gallbladder has been surgically removed. No acute upper abdominal abnormality is seen. Musculoskeletal: Postsurgical changes in the lower cervical spine are seen. No acute rib abnormality is noted. No compression deformity is seen. IMPRESSION: Chronic changes without acute abnormality. Aortic Atherosclerosis (ICD10-I70.0) and Emphysema (ICD10-J43.9). Electronically Signed   By: Oneil Devonshire M.D.   On: 04/07/2024 03:53   DG Chest 2 View Result Date:  04/06/2024 CLINICAL DATA:  Chest pain EXAM: CHEST - 2 VIEW COMPARISON:  Chest x-ray 03/01/2019 FINDINGS: There are increased interstitial markings diffusely throughout both lungs. There is no lung consolidation, pleural effusion or pneumothorax. The cardiomediastinal silhouette is within normal limits. No acute fractures are seen. Cervical spinal fusion plate is present. IMPRESSION: Increased interstitial markings diffusely throughout both lungs, which may represent pulmonary edema or atypical infection. Electronically Signed   By: Greig Pique M.D.   On: 04/06/2024 23:27   TIMI Risk Score for Unstable Angina or Non-ST Elevation MI:   The patient's TIMI risk score is 6, which indicates a 41% risk of all cause mortality, new or recurrent myocardial infarction or need for urgent revascularization in the next 14 days.{  Assessment and Plan:  RADHA COGGINS is a 73 y.o. female with CAD with PCI (2007, LAD 2013, pRCA and LAD in 02/2019), HLD, HTN, paroxysmal AF who presents with CP and hsT elevation c/w NSTEMI.   NSTEMI CAD Paroxysmal AF  Kaitlyn Jimenez presents with similar complaints consistent with prior episodes of progressive coronary stenosis.  Lab work notable for low-level troponin elevation with upward trend consistent with NSTEMI.  Currently she has mild symptoms but was not interested in nitroglycerin .  She has multiple allergies/contraindications and has required steroid prep with prior coronary evaluations.  She says that she Kaitlyn have to get hide to the lip swelling with contrast.  Plan for coronary angiography today pending availabiltiy.  - s/p ASA 324 mg PO load, continue ASA 81 mg daily  - no longer on OAC, s/p AF ablation in 2022 and has multiple intolerances to OAC, no AF recurrence post ablation.  - TTE ordered - heparin  gtt - prednisone  50 mg q6h x2 ordered (first dose at 0515), Kaitlyn need additional methylpred and benadryl  prior to cath  - risks/benefits and alternatives of coronary  angiography and PCI reviewed, patient agreeable to proceed   Informed Consent   Shared Decision Making/Informed Consent{  The risks [stroke (1 in 1000), death (1 in 1000), kidney failure [usually temporary] (1 in 500), bleeding (1 in 200), allergic reaction [possibly serious] (1 in 200)], benefits (diagnostic support and management of coronary artery disease) and alternatives of a cardiac catheterization were discussed in detail with Kaitlyn Jimenez and she is willing to proceed.    For questions or updates, please contact [facility] Please consult [email or web address] for contact info under   Signed, Donnice DELENA Primus, MD  04/07/2024 6:37 AM

## 2024-04-07 NOTE — H&P (Addendum)
 History and Physical    Kaitlyn Jimenez FMW:[id number] DOB: March 10, 1951 DOA: 04/06/2024  PCP: Pandora Therisa RAMAN, NP   Patient coming from: Home   Chief Complaint:  Chief Complaint  Patient presents with   Chest Pain   ED TRIAGE note:  Patient bib EMS from home with complaints of chest pain and shortness of breath. EMS reports the chest pain radiates to the front of her neck and it comes and goes. EMS also reports her oxygen was in the 80's on room air.  4L of o2 given and saturation is now 97% 0.4 Nitro given and 324 of aspirin  given enroute.    No change in chest pain after the nitro.      HPI:  Kaitlyn Jimenez is a 73 y.o. female with medical history significant of unstable angina, CAD s/p multivessel CAD and right RCA DES stent 02/2019, paroxysmal afibrillation s/p ablation -refused anticoagulation only on aspirin , left bundle branch block, hypothyroidism, peripheral vascular disease, dyslipidemia and essential hypertension presented to emergency department complaining of mid substernal intermittent chest pain for 2 days.  EMS found patient O2 sat 80% room air and after placing 4 L of oxygen O2 sat improved to 97%.  EMS also gave aspirin  324 mg and nitroglycerin  without any improvement of the chest pain.  At presentation to ED patient found hypertensive blood pressure 181/103, O2 sat 85% room air has been improved to 95 to 98% on 4 L oxygen.  Initial troponin 14 which has been trended up to 48. Normal BNP 89.9. EKG normal sinus rhythm heart rate 81, left bundle branch block and ST elevation in lead III and depression lead aVL.  EKG findings are similar as compared to previous EKG January 2025.  Chest x-ray showing increased interstitial marking of the both lung representing pulmonary edema or atypical infection.  In the ED patient received Xopenex  nebulizer. ED physician reported that while patient in the ED he did not have any episodes of chest pain however physical exam revealed  bilateral lower lung field rhonchi and rales.  Given second troponin has been trending up hospitalist has been consulted for admission and for further evaluation of chest pain.  Requested ED physician to consult cardiology.   Obtaining CT chest to rule out pneumonia/pulm edema.  Per chart review previous heart cath from 03/14/2019 showing multivessel CAD with diffuse LAD stent restenosis status post accessible PCI of the RCA with DES stent and patient was discharged home with aspirin  and Brilinta  for 12 months.  During my evaluation at bedside patient reported that she has mid substernal chest pressure which get exacerbated with exertion when she walked to the bathroom developed chest pressure with radiation to to the mid neck area.  Patient reported that she has intermittent chest pain for a while however for last 2 days she has chest pain increasing with more frequency and chest pain get worse with movement. Patient denies any associated palpitation, diaphoresis and shortness of breath.  Denies any nausea and headache.  Patient reported that she has history of intolerance with Eliquis  due to headache.  History of shortness of breath with Brilinta  and history of heartburn with Plavix . Also stating patient has history of contrast allergy as well. Patient has previous history of MI 2013 and most recent heart cath in 2020 showed multivessel CAD.   Significant labs in the ED: Lab Orders         Resp panel by RT-PCR (RSV, Flu A&B, Covid) Anterior Nasal Swab  Basic metabolic panel         CBC         Brain natriuretic peptide         Comprehensive metabolic panel         CBC         Protime-INR         APTT         Lipid panel       Review of Systems:  Review of Systems  Constitutional:  Negative for chills, fever, malaise/fatigue and weight loss.  Respiratory:  Negative for cough, sputum production and shortness of breath.   Cardiovascular:  Positive for chest pain. Negative for  orthopnea, claudication, leg swelling and PND.  Gastrointestinal:  Negative for abdominal pain, heartburn, nausea and vomiting.  Musculoskeletal:  Negative for back pain, joint pain, myalgias and neck pain.  Neurological:  Negative for dizziness and headaches.  Psychiatric/Behavioral:  The patient is not nervous/anxious.     Past Medical History:  Diagnosis Date   Atypical chest pain 01/07/2019   Coronary artery disease involving native coronary artery of native heart with angina pectoris (HCC) 07/13/2017   Stenting in 2007, 2013 to LAD, last cardiac cath in 02/2019 w/ prox RCA stent and 50% ISR of LAD stent   Dyslipidemia 07/13/2017   Essential hypertension 07/13/2017   Gastroesophageal reflux disease without esophagitis 05/25/2020   Hypertension    MI (myocardial infarction) (HCC)    MI (myocardial infarction) (HCC)    Other hyperlipidemia 05/25/2020   Other specified hypothyroidism 05/25/2020   Primary hypertension 05/25/2020   Thyroid  disease    Unstable angina (HCC) 03/01/2019    Past Surgical History:  Procedure Laterality Date   ABDOMINAL HYSTERECTOMY     ATRIAL FIBRILLATION ABLATION N/A 06/21/2021   Procedure: ATRIAL FIBRILLATION ABLATION;  Surgeon: Inocencio Soyla Lunger, MD;  Location: [facility] INVASIVE CV LAB;  Service: Cardiovascular;  Laterality: N/A;   CARDIAC CATHETERIZATION     CHOLECYSTECTOMY     CORONARY ANGIOPLASTY WITH STENT PLACEMENT     LEFT HEART CATH AND CORONARY ANGIOGRAPHY N/A 03/02/2019   Procedure: LEFT HEART CATH AND CORONARY ANGIOGRAPHY;  Surgeon: Wonda Sharper, MD;  Location: [facility] INVASIVE CV LAB;  Service: Cardiovascular;  Laterality: N/A;     reports that she has quit smoking. She has never used smokeless tobacco. She reports that she does not drink alcohol and does not use drugs.  Allergies  Allergen Reactions   Bee Venom Anaphylaxis   Penciclovir Hives   Shellfish-Derived Products Anaphylaxis    Breathing difficulty   Sulfa Antibiotics Hives and  Palpitations   Brilinta  [Ticagrelor ]     Shortness of breath   Eliquis  [Apixaban ] Other (See Comments)    Caused severe headaches, blurred vision   Prasugrel  Other (See Comments)    Hives    Iodinated Contrast Media Swelling, Palpitations and Rash    Breathing difficulty Breaks out also  Hx of breakthru reaction - radiology advises against use of contrast despite steroid/antihistamine prep Lips swell   Penicillins Hives and Rash    Did it involve swelling of the face/tongue/throat, SOB, or low BP? No Did it involve sudden or severe rash/hives, skin peeling, or any reaction on the inside of your mouth or nose? Yes Did you need to seek medical attention at a hospital or doctor's office? No When did it last happen?    Pt was a child    If all above answers are "NO", may proceed with cephalosporin use.  Pt states that she also passes out    Ramipril Nausea And Vomiting and Other (See Comments)    pancreatis **Altace**    Family History  Problem Relation Age of Onset   Heart disease Mother    Heart attack Father    Heart attack Brother    Congestive Heart Failure Brother    Rheum arthritis Brother    Arrhythmia Sister    Heart disease Maternal Uncle    Heart disease Paternal Uncle     Prior to Admission medications   Medication Sig Start Date End Date Taking? Authorizing Provider  ALPRAZolam  (XANAX ) 1 MG tablet Take 1 mg by mouth 2 (two) times daily.    [provider]  aspirin  EC 81 MG tablet Take 1 tablet (81 mg total) by mouth daily. Swallow whole. 05/02/21   Camnitz, Soyla Lunger, MD  cyanocobalamin  (,VITAMIN B-12,) 1000 MCG/ML injection Inject 1,000 mcg into the muscle every 30 (thirty) days.    [provider]  EPINEPHrine 0.3 mg/0.3 mL IJ SOAJ injection Inject 0.3 mg into the muscle as needed for anaphylaxis.    [provider]  famotidine  (PEPCID ) 20 MG tablet Take 20 mg by mouth daily as needed for heartburn or indigestion.    [provider]  fluticasone (FLONASE) 50 MCG/ACT nasal spray Place 1 spray into both nostrils daily as needed for allergies. 03/23/20   [provider]  levothyroxine  (SYNTHROID ) 88 MCG tablet Take 88 mcg by mouth daily before breakfast.    [provider]  metoprolol  tartrate (LOPRESSOR ) 50 MG tablet Take 1 tablet (50 mg total) by mouth 2 (two) times daily. 01/06/19   Krasowski, Robert J, MD  mupirocin ointment (BACTROBAN) 2 % Apply 1 application topically 3 (three) times daily. 04/02/21   [provider]  nitroGLYCERIN  (NITROSTAT ) 0.4 MG SL tablet Place 1 tablet (0.4 mg total) under the tongue every 5 (five) minutes as needed for chest pain. 03/03/19   Marcine Catalan M, PA-C  polyvinyl alcohol (LIQUIFILM TEARS) 1.4 % ophthalmic solution Place 1 drop into both eyes as needed for dry eyes.    [provider]  rosuvastatin  (CRESTOR ) 5 MG tablet Take 5 mg by mouth daily. 08/03/23   [provider]  sodium chloride  (OCEAN) 0.65 % SOLN nasal spray Place 1 spray into both nostrils as needed for congestion.    [provider]  triamcinolone  ointment (KENALOG ) 0.1 % Apply 1 application topically as needed (demititis). 01/19/20   [provider]  valsartan  (DIOVAN ) 160 MG tablet Take 1 tablet (160 mg total) by mouth daily. 02/23/20   Krasowski, Robert J, MD  Vitamin D, Ergocalciferol, (DRISDOL) 1.25 MG (50000 UT) CAPS capsule Take 50,000 Units by mouth every 30 (thirty) days.    [provider]     Physical Exam: Vitals:   04/07/24 0000 04/07/24 0223 04/07/24 0323 04/07/24 0327  BP: (!) 164/74 (!) 169/76 (!) 176/78 (!) 176/78  Pulse: 80 84 99 98  Resp: 19 (!) 24 18   Temp:  98.4 F (36.9 C)    TempSrc:  Oral    SpO2: 95% 98% 98%     Physical Exam Vitals and nursing note reviewed.  Constitutional:      Appearance: She is not ill-appearing.  Cardiovascular:     Rate and Rhythm: Normal rate and regular rhythm.     Heart sounds:  Normal heart sounds.  Pulmonary:     Effort: Pulmonary effort is normal.     Breath  sounds: Normal breath sounds. No wheezing.  Musculoskeletal:     Cervical back: Neck supple.     Right lower leg: No edema.     Left lower leg: No edema.  Skin:    General: Skin is warm.     Capillary Refill: Capillary refill takes less than 2 seconds.  Neurological:     Mental Status: She is alert and oriented to person, place, and time.  Psychiatric:        Mood and Affect: Mood normal. Mood is not anxious.      Labs on Admission: I have personally reviewed following labs and imaging studies  CBC: Recent Labs  Lab 04/06/24 2206  WBC 9.2  HGB 12.7  HCT 39.4  MCV 90.8  PLT 301   Basic Metabolic Panel: Recent Labs  Lab 04/06/24 2206  NA 128*  K 4.1  CL 98  CO2 22  GLUCOSE 110*  BUN 9  CREATININE 0.86  CALCIUM  8.9   GFR: CrCl cannot be calculated (Unknown ideal weight.). Liver Function Tests: No results for input(s): AST, ALT, ALKPHOS, BILITOT, PROT, ALBUMIN in the last 168 hours. No results for input(s): LIPASE, AMYLASE in the last 168 hours. No results for input(s): AMMONIA in the last 168 hours. Coagulation Profile: No results for input(s): INR, PROTIME in the last 168 hours. Cardiac Enzymes: Recent Labs  Lab 04/06/24 2206 04/06/24 2359  TROPONINIHS 14 48*   BNP (last 3 results) Recent Labs    04/06/24 2359  BNP 89.9   HbA1C: No results for input(s): HGBA1C in the last 72 hours. CBG: No results for input(s): GLUCAP in the last 168 hours. Lipid Profile: No results for input(s): CHOL, HDL, LDLCALC, TRIG, CHOLHDL, LDLDIRECT in the last 72 hours. Thyroid  Function Tests: No results for input(s): TSH, T4TOTAL, FREET4, T3FREE, THYROIDAB in the last 72 hours. Anemia Panel: No results for input(s): VITAMINB12, FOLATE, FERRITIN, TIBC, IRON, RETICCTPCT in the last 72 hours. Urine analysis: No results found for:  COLORURINE, APPEARANCEUR, LABSPEC, PHURINE, GLUCOSEU, HGBUR, BILIRUBINUR, KETONESUR, PROTEINUR, UROBILINOGEN, NITRITE, LEUKOCYTESUR  Radiological Exams on Admission: I have personally reviewed images DG Chest 2 View Result Date: 04/06/2024 CLINICAL DATA:  Chest pain EXAM: CHEST - 2 VIEW COMPARISON:  Chest x-ray 03/01/2019 FINDINGS: There are increased interstitial markings diffusely throughout both lungs. There is no lung consolidation, pleural effusion or pneumothorax. The cardiomediastinal silhouette is within normal limits. No acute fractures are seen. Cervical spinal fusion plate is present. IMPRESSION: Increased interstitial markings diffusely throughout both lungs, which may represent pulmonary edema or atypical infection. Electronically Signed   By: Greig Pique M.D.   On: 04/06/2024 23:27     EKG: My personal interpretation of EKG shows:  EKG normal sinus rhythm heart rate 81, left bundle branch block and ST elevation in lead III and depression lead aVL.  EKG findings are similar as compared to previous EKG January 2025.   Assessment/Plan: Principal Problem:   NSTEMI (non-ST elevated myocardial infarction) (HCC) Active Problems:   Chest pain   Essential hypertension   Hypothyroidism   Paroxysmal atrial fibrillation (HCC)   LBBB (left bundle branch block)   History of CAD (coronary artery disease)   Peripheral vascular disease (HCC)   Acute hypoxic respiratory failure (HCC)    Assessment and Plan: NSTEMI History of unstable angina History of CAD status post right DSE stent placement in 2020 history of multivessel CAD History of LBBB -Present emergency department for mid mid substernal chest pain which getting worse with exertion  and movement.  Patient reported mid substernal pressure that radiates towards the neck. Denies any associated diaphoresis, nausea, and SOB.  Patient denies any radiation.  Denies any diaphoresis and nausea.  Patient reported  chest pain exacerbated by nothing.  EMS gave aspirin  load and sublingual nitroglycerin  without any improvement of chest pain however upon presentation to ED patient had chest pain but currently patient is chest pain-free at rest. - Initial troponin 14 and second troponin 48.  Normal BNP of 89.9.  Chest x-ray showing atypical pneumonia versus pulmonary edema bilateral lower lung field. - Obtaining CT chest without contrast for clear picture. -Per chart review previous heart cath from 03/14/2019 showing multivessel CAD with diffuse LAD stent restenosis status post accessible PCI of the RCA with DES stent and patient was discharged home with aspirin  and Brilinta  for 12 months. - Echo from 2023 showed preserved EF 60 to 65% and diastolic heart failure. -As troponin has been trending up starting IV heparin  drip.  Discussed regarding IV heparin  drip with patient and she is agreeable. - Obtaining echocardiogram to assess any wall motion abnormality. - Continue aspirin  81 mg daily, Lopressor  50 mg twice daily.  Continue Crestor . Continue sublingual nitroglycerin  as needed.  Continue cardiac monitoring.  Checking lipid panel. As there is high probability of patient will need heart cath to reassess restenosis of the previous cardiac stents keeping patient n.p.o. -Cardiology Dr. Almetta has been consulted.  Recommended to start IV heparin  drip and will evaluate patient soon as there is a high chance of that patient might need another heart cath.   Acute hypoxic respiratory failure-unclear etiology -At presentation to ED and per EMS report O2 sat 80% room air which has been improved to 95 to 98% after 2 L of oxygen and Xopenex  breathing treatment.  Chest x-ray showing increased interstitial markings throughout the both lung represents pulmonary edema and atypical infection.  Patient does not have any fever, cough and CBC no evidence of leukocytosis. -Patient has history of allergic reaction with contrast. -  Obtaining CT chest without contrast to follow-up with pulmonary edema/pneumonia. - Continue Xopenex  as needed, supplemental oxygen goal to keep O2 sat above 96% and continue check pulse ox. Addendum - CT chest no acute abnormality.  Biapical pleural and parenchymal scarring.  No evidence of pneumonia pulmonary vascular congestion and pneumonia. -Continue above management.    Essential hypertension Grade 1 diastolic heart failure -Elevated blood pressure upper 180s range. -Resuming Avapro  150 mg (at home patient takes valsartan  160 mg) Continue Lopressor   Paroxysmal atrial fibrillation status post ablation-not on anticoagulation per patient choice and decision EKG showing normal sinus rhythm and left bundle branch block.  Continue aspirin .  Continue cardiac monitoring.  Hypothyroidism -Continue levothyroxine   History of peripheral vascular disease -Continue aspirin  and Lipitor  Euvolemic hyponatremia - Initial serum sodium 128 which has been improved to 131.  Continue to monitor.    DVT prophylaxis: IV heparin  drip. Code Status:  Full Code Diet: N.p.o. Family Communication:   Family was present at bedside, at the time of interview. Opportunity was given to ask question and all questions were answered satisfactorily.  Disposition Plan: Continue to trend troponin, pending echocardiogram Consults: Cardiology Admission status:   Inpatient, Telemetry bed  Severity of Illness: The appropriate patient status for this patient is INPATIENT. Inpatient status is judged to be reasonable and necessary in order to provide the required intensity of service to ensure the patient's safety. The patient's presenting symptoms, physical exam findings, and initial radiographic  and laboratory data in the context of their chronic comorbidities is felt to place them at high risk for further clinical deterioration. Furthermore, it is not anticipated that the patient will be medically stable for discharge  from the hospital within 2 midnights of admission.   * I certify that at the point of admission it is my clinical judgment that the patient will require inpatient hospital care spanning beyond 2 midnights from the point of admission due to high intensity of service, high risk for further deterioration and high frequency of surveillance required.DEWAINE    Osborne Serio, MD [facility]  How to contact the [facility] Attending or Consulting provider 7A - 7P or covering provider during after hours 7P -7A, for this patient.  Check the care team in [facility] and look for a) attending/consulting [facility] provider listed and b) the [facility] team listed Log into [email or web address] and use [facility] universal password to access. If you do not have the password, please contact the hospital operator. Locate the [facility] provider you are looking for under [facility] and page to a number that you can be directly reached. If you still have difficulty reaching the provider, please page the [facility] (Director on Call) for the Hospitalists listed on amion for assistance.  04/07/2024, 3:39 AM

## 2024-04-07 NOTE — Progress Notes (Addendum)
 PHARMACY - ANTICOAGULATION CONSULT NOTE  Pharmacy Consult for heparin  Indication: chest pain/ACS  Patient Measurements: Height: 5' 1 (154.9 cm) Weight: 79.8 kg (176 lb) IBW/kg (Calculated) : 47.8 HEPARIN  DW (KG): 65.8  Vital Signs: Temp: 98.4 F (36.9 C) (08/14 0223) Temp Source: Oral (08/14 0223) BP: 176/78 (08/14 0327) Pulse Rate: 98 (08/14 0327)  Labs: Recent Labs    04/06/24 2206 04/06/24 2359 04/07/24 0327  HGB 12.7  --  12.8  HCT 39.4  --  39.5  PLT 301  --  273  APTT  --   --  30  LABPROT  --   --  13.5  INR  --   --  1.0  CREATININE 0.86  --  0.90  TROPONINIHS 14 48*  --     Estimated Creatinine Clearance: 53.3 mL/min (by C-G formula based on SCr of 0.9 mg/dL).   Medical History: Past Medical History:  Diagnosis Date   Atypical chest pain 01/07/2019   Coronary artery disease involving native coronary artery of native heart with angina pectoris (HCC) 07/13/2017   Stenting in 2007, 2013 to LAD, last cardiac cath in 02/2019 w/ prox RCA stent and 50% ISR of LAD stent   Dyslipidemia 07/13/2017   Essential hypertension 07/13/2017   Gastroesophageal reflux disease without esophagitis 05/25/2020   Hypertension    MI (myocardial infarction) (HCC)    MI (myocardial infarction) (HCC)    Other hyperlipidemia 05/25/2020   Other specified hypothyroidism 05/25/2020   Primary hypertension 05/25/2020   Thyroid  disease    Unstable angina (HCC) 03/01/2019    Assessment: 15 yoF presented to ED with chest pain. PMH includes CAD (s/p multiple stents with most recent in 2020), HTN, HLD, hx of afib (s/p ablation in 2022 remaining in sinus rhythm- stopped Xarelto  at that time) and hypothyroidism. Pharmacy consulted to dose heparin  for ACS.  -CBC stable -No oral anticoagulation reported -Trops 48  Goal of Therapy:  Heparin  level 0.3-0.7 units/ml Monitor platelets by anticoagulation protocol: Yes   Plan:  Give 4000 units bolus x 1 Start heparin  infusion at 750  units/hr Check anti-Xa level in 8 hours and daily while on heparin  Continue to monitor H&H and platelets F/u cards consult and plans for [facility]   Lynwood Poplar, PharmD, BCPS Clinical Pharmacist 04/07/2024 4:33 AM

## 2024-04-08 ENCOUNTER — Inpatient Hospital Stay (HOSPITAL_COMMUNITY)

## 2024-04-08 ENCOUNTER — Other Ambulatory Visit: Payer: Self-pay | Admitting: Physician Assistant

## 2024-04-08 ENCOUNTER — Encounter (HOSPITAL_COMMUNITY): Payer: Self-pay | Admitting: Cardiology

## 2024-04-08 DIAGNOSIS — R079 Chest pain, unspecified: Secondary | ICD-10-CM

## 2024-04-08 DIAGNOSIS — I10 Essential (primary) hypertension: Secondary | ICD-10-CM

## 2024-04-08 DIAGNOSIS — I214 Non-ST elevation (NSTEMI) myocardial infarction: Secondary | ICD-10-CM | POA: Diagnosis not present

## 2024-04-08 LAB — CBC
HCT: 36.9 % (ref 36.0–46.0)
Hemoglobin: 12.3 g/dL (ref 12.0–15.0)
MCH: 29.1 pg (ref 26.0–34.0)
MCHC: 33.3 g/dL (ref 30.0–36.0)
MCV: 87.4 fL (ref 80.0–100.0)
Platelets: 315 K/uL (ref 150–400)
RBC: 4.22 MIL/uL (ref 3.87–5.11)
RDW: 12.5 % (ref 11.5–15.5)
WBC: 16.6 K/uL — ABNORMAL HIGH (ref 4.0–10.5)
nRBC: 0 % (ref 0.0–0.2)

## 2024-04-08 LAB — ECHOCARDIOGRAM COMPLETE
AR max vel: 2.17 cm2
AV Area VTI: 1.84 cm2
AV Area mean vel: 1.96 cm2
AV Mean grad: 3 mmHg
AV Peak grad: 5.2 mmHg
Ao pk vel: 1.14 m/s
Calc EF: 55.1 %
Height: 61 in
S' Lateral: 3.2 cm
Single Plane A2C EF: 63.6 %
Single Plane A4C EF: 42.1 %
Weight: 2816 [oz_av]

## 2024-04-08 MED ORDER — CLOPIDOGREL BISULFATE 75 MG PO TABS
75.0000 mg | ORAL_TABLET | Freq: Every day | ORAL | Status: DC
  Administered 2024-04-08: 75 mg via ORAL
  Filled 2024-04-08: qty 1

## 2024-04-08 MED ORDER — CLOPIDOGREL BISULFATE 75 MG PO TABS: 75.0000 mg | ORAL_TABLET | Freq: Every day | ORAL | 0 refills | Status: DC

## 2024-04-08 NOTE — Progress Notes (Signed)
 AVS reviewed Patient expressed verbal understanding of Plan of care.  Patient has Ecchymotic area to posterior portion or right hand and wrist.  Site has been marked.  Patient states minimal pain with tactile stimulation.  Patient states, The nurse in the emergency department put the IV in my hand and it started to hurt.   RN and staff also aware.  RIDE here, Agricultural consultant at bedside. To transport patient  to main entrance.

## 2024-04-08 NOTE — Plan of Care (Signed)
  Problem: Cardiovascular: Goal: Ability to achieve and maintain adequate cardiovascular perfusion will improve Outcome: Progressing Goal: Vascular access site(s) Level 0-1 will be maintained Outcome: Progressing   Problem: Clinical Measurements: Goal: Ability to maintain clinical measurements within normal limits will improve Outcome: Progressing Goal: Will remain free from infection Outcome: Progressing Goal: Diagnostic test results will improve Outcome: Progressing Goal: Respiratory complications will improve Outcome: Progressing Goal: Cardiovascular complication will be avoided Outcome: Progressing   

## 2024-04-08 NOTE — Hospital Course (Signed)
 Kaitlyn Jimenez is a 73 y.o. female with a history of unstable angina, CAD s/p multivessel CABG and stent, paroxysmal atrial fibrillation s/p ablation, LBBB, hypothyroidism, PVD, hyperlipidemia, hypertension.  Patient presented secondary to chest pain with concern for ACS. Patient underwent heart catheterization with evidence of nonobstructive moderate CAD. Patient recommended to discharge on aspirin  and Plavix .

## 2024-04-08 NOTE — Progress Notes (Signed)
 Ordered OP renal artery US 

## 2024-04-08 NOTE — Discharge Summary (Signed)
 Physician Discharge Summary   Patient: Kaitlyn Jimenez MRN: [id number] DOB: 03/29/1951  Admit date:     04/06/2024  Discharge date: 04/08/24  Discharge Physician: Elgin Lam, MD   PCP: Pandora Therisa RAMAN, NP   Recommendations at discharge:  PCP visit for hospital follow-up Cardiology visit for hospital follow-up Cardiology plan for renal artery duplex as an outpatient  Discharge Diagnoses: Principal Problem:   Non-ST elevation (NSTEMI) myocardial infarction [facility]) Active Problems:   Chest pain   Coronary artery disease due to lipid rich plaque   Essential hypertension   Hypothyroidism   PAF (paroxysmal atrial fibrillation) (HCC)   LBBB (left bundle branch block)   History of CAD (coronary artery disease)   Peripheral vascular disease (HCC)   Acute hypoxic respiratory failure (HCC)   Pure hypercholesterolemia  Resolved Problems:   * No resolved hospital problems. *  Hospital Course: Kaitlyn Jimenez is a 73 y.o. female with a history of unstable angina, CAD s/p multivessel CABG and stent, paroxysmal atrial fibrillation s/p ablation, LBBB, hypothyroidism, PVD, hyperlipidemia, hypertension.  Patient presented secondary to chest pain with concern for ACS. Patient underwent heart catheterization with evidence of nonobstructive moderate CAD. Patient recommended to discharge on aspirin  and Plavix .  Assessment and Plan:  NSTEMI Patient with chest pain and elevated troponin. Significant CAD history. Cardiology consulted and plan for cardiac catheterization this admission. Heparin  IV started. Patient underwent a cardiac catheterization on 8/14 with evidence of moderate disease. Recommendation for aspirin  and Plavix .   Acute respiratory failure with hypoxia Present on admission with patient requiring supplemental oxygen. Possible cardiac etiology. CT imaging without abnormality. Patient weaned to room air.   Primary hypertension Continue metoprolol  and valsartan . Plan for outpatient  renal artery duplex.   Chronic HFpEF Stable.   Paroxysmal atrial fibrillation History of ablation. Not on anticoagulation. Currently in sinus rhythm. Continue metoprolol .   Hypothyroidism Continue Synthroid .   PAD Continue aspirin  and Crestor .   Hyponatremia Patient is euvolemic. Sodium of 128 on admission and improved to 131.   Consultants: Cardiology Procedures performed: Transthoracic Echocardiogram  Disposition: Home Diet recommendation: Cardiac diet   DISCHARGE MEDICATION: Allergies as of 04/08/2024       Reactions   Bee Venom Anaphylaxis   Penciclovir Hives   Shellfish-derived Products Anaphylaxis   Breathing difficulty   Sulfa Antibiotics Hives, Palpitations   Brilinta  [ticagrelor ]    Shortness of breath   Eliquis  [apixaban ] Other (See Comments)   Caused severe headaches, blurred vision   Prasugrel  Other (See Comments)   Hives    Iodinated Contrast Media Swelling, Palpitations, Rash   Breathing difficulty Breaks out also  Hx of breakthru reaction - radiology advises against use of contrast despite steroid/antihistamine prep Lips swell   Penicillins Hives, Rash   Did it involve swelling of the face/tongue/throat, SOB, or low BP? No Did it involve sudden or severe rash/hives, skin peeling, or any reaction on the inside of your mouth or nose? Yes Did you need to seek medical attention at a hospital or doctor's office? No When did it last happen?    Pt was a child    If all above answers are "NO", may proceed with cephalosporin use. Pt states that she also passes out    Ramipril Nausea And Vomiting, Other (See Comments)   pancreatis **Altace**        Medication List     TAKE these medications    ALPRAZolam  1 MG tablet Commonly known as: XANAX  Take 1 mg by mouth  2 (two) times daily.   artificial tears ophthalmic solution Place 1 drop into both eyes as needed for dry eyes.   aspirin  EC 81 MG tablet Take 1 tablet (81 mg total) by mouth daily.  Swallow whole.   clopidogrel  75 MG tablet Commonly known as: PLAVIX  Take 1 tablet (75 mg total) by mouth daily. Start taking on: April 09, 2024   cyanocobalamin  1000 MCG/ML injection Commonly known as: VITAMIN B12 Inject 1,000 mcg into the muscle every 30 (thirty) days.   EPINEPHrine 0.3 mg/0.3 mL Soaj injection Commonly known as: EPI-PEN Inject 0.3 mg into the muscle as needed for anaphylaxis.   famotidine  20 MG tablet Commonly known as: PEPCID  Take 20 mg by mouth daily as needed for heartburn or indigestion.   fluticasone 50 MCG/ACT nasal spray Commonly known as: FLONASE Place 1 spray into both nostrils daily as needed for allergies.   levothyroxine  88 MCG tablet Commonly known as: SYNTHROID  Take 88 mcg by mouth daily before breakfast.   metoprolol  tartrate 50 MG tablet Commonly known as: LOPRESSOR  Take 1 tablet (50 mg total) by mouth 2 (two) times daily.   nitroGLYCERIN  0.4 MG SL tablet Commonly known as: NITROSTAT  Place 1 tablet (0.4 mg total) under the tongue every 5 (five) minutes as needed for chest pain.   rosuvastatin  5 MG tablet Commonly known as: CRESTOR  Take 10 mg by mouth daily.   sodium chloride  0.65 % Soln nasal spray Commonly known as: OCEAN Place 1 spray into both nostrils as needed for congestion.   triamcinolone  ointment 0.1 % Commonly known as: KENALOG  Apply 1 application topically as needed (demititis).   valsartan  160 MG tablet Commonly known as: DIOVAN  Take 1 tablet (160 mg total) by mouth daily.   Vitamin D (Ergocalciferol) 1.25 MG (50000 UNIT) Caps capsule Commonly known as: DRISDOL Take 50,000 Units by mouth every Wednesday.        Follow-up Information     Pandora Therisa RAMAN, NP. Schedule an appointment as soon as possible for a visit in 1 week(s).   Specialty: Nurse Practitioner Contact information: [address] [place] [address] [phone]         Bernie Lamar PARAS, MD. Schedule an appointment as soon as possible  for a visit.   Specialty: Cardiology Why: For hospital follow-up Contact information: [address] [place] [address] [phone]                Discharge Exam: BP (!) 168/77 (BP Location: Left Arm)   Pulse 87   Temp 97.8 F (36.6 C) (Oral)   Resp 18   Ht 5' 1 (1.549 m)   Wt 79.8 kg   SpO2 98%   BMI 33.25 kg/m   General exam: Appears calm and comfortable Respiratory system: Clear to auscultation. Respiratory effort normal. Cardiovascular system: S1 & S2 heard, RRR. No murmurs. Gastrointestinal system: Abdomen is nondistended, soft and nontender. Normal bowel sounds heard. Central nervous system: Alert and oriented. No focal neurological deficits. Psychiatry: Judgement and insight appear normal. Mood & affect appropriate.   Condition at discharge: stable  The results of significant diagnostics from this hospitalization (including imaging, microbiology, ancillary and laboratory) are listed below for reference.   Imaging Studies: CARDIAC CATHETERIZATION Result Date: 04/07/2024 Images from the original result were not included. Coronary angiography 04/07/2024: LM: Normal LAD: Prox-mid LAD stents w/ 50% restenosis, unchanged compared to 2020 cath Lcx: Proximal 30% disease RCA: Patent proximal RCA stent, with no restenosis  Mid 50% disease, new since 2020 cath LVEDP 27 mmHg Conclusion: Moderate nonobstructive coronary artery disease. No new severe lesion compared to 2020 cath. Mildly elevated troponin without any critical lesion likely due to hypertensive urgency. Recommend aggressive management of hypertension, consider secondary workup for causes such as renal artery stenosis if clinically indicated. Patient has not been on anticoagulation for A-fib per her wishes (reported side effects to Xarelto  and Eliquis  in the past). If patient is not going to be on anticoagulation for PAF, consider 1 year DAPT w/Aspirin  and plavix  for medical management of NSTEMI. If  anticoagulation for Afib considered at any point, could skip Aspirin . Newman JINNY Lawrence, MD   CT CHEST WO CONTRAST Result Date: 04/07/2024 CLINICAL DATA:  Chest pain for 2 days EXAM: CT CHEST WITHOUT CONTRAST TECHNIQUE: Multidetector CT imaging of the chest was performed following the standard protocol without IV contrast. RADIATION DOSE REDUCTION: This exam was performed according to the departmental dose-optimization program which includes automated exposure control, adjustment of the mA and/or kV according to patient size and/or use of iterative reconstruction technique. COMPARISON:  Plain film from the previous day. FINDINGS: Cardiovascular: Atherosclerotic calcifications of the thoracic aorta are noted. No aneurysmal dilatation is seen. No cardiac enlargement is noted. Coronary calcification and stenting is noted. No pericardial effusion is seen. Mediastinum/Nodes: Thoracic inlet is within normal limits. Scattered small mediastinal nodes are seen. No sizable adenopathy is noted. The esophagus as visualized is within normal limits. Lungs/Pleura: Lungs are well aerated bilaterally. Diffuse emphysematous changes and mosaic attenuation is noted. Mild subpleural fibrosis is noted in the upper lobes. Biapical pleural and parenchymal scarring is seen. Minimal right-sided pleural effusion is seen. Upper Abdomen: Gallbladder has been surgically removed. No acute upper abdominal abnormality is seen. Musculoskeletal: Postsurgical changes in the lower cervical spine are seen. No acute rib abnormality is noted. No compression deformity is seen. IMPRESSION: Chronic changes without acute abnormality. Aortic Atherosclerosis (ICD10-I70.0) and Emphysema (ICD10-J43.9). Electronically Signed   By: Oneil Devonshire M.D.   On: 04/07/2024 03:53   DG Chest 2 View Result Date: 04/06/2024 CLINICAL DATA:  Chest pain EXAM: CHEST - 2 VIEW COMPARISON:  Chest x-ray 03/01/2019 FINDINGS: There are increased interstitial markings diffusely  throughout both lungs. There is no lung consolidation, pleural effusion or pneumothorax. The cardiomediastinal silhouette is within normal limits. No acute fractures are seen. Cervical spinal fusion plate is present. IMPRESSION: Increased interstitial markings diffusely throughout both lungs, which may represent pulmonary edema or atypical infection. Electronically Signed   By: Greig Pique M.D.   On: 04/06/2024 23:27    Microbiology: Results for orders placed or performed during the hospital encounter of 04/06/24  Resp panel by RT-PCR (RSV, Flu A&B, Covid) Anterior Nasal Swab     Status: None   Collection Time: 04/06/24 10:05 PM   Specimen: Anterior Nasal Swab  Result Value Ref Range Status   SARS Coronavirus 2 by RT PCR NEGATIVE NEGATIVE Final   Influenza A by PCR NEGATIVE NEGATIVE Final   Influenza B by PCR NEGATIVE NEGATIVE Final    Comment: (NOTE) The Xpert Xpress SARS-CoV-2/FLU/RSV plus assay is intended as an aid in the diagnosis of influenza from Nasopharyngeal swab specimens and should not be used as a sole basis for treatment. Nasal washings and aspirates are unacceptable for Xpert Xpress SARS-CoV-2/FLU/RSV testing.  Fact Sheet for Patients: BloggerCourse.com  Fact Sheet for Healthcare Providers: SeriousBroker.it  This test is not yet approved or cleared by the [place]  FDA and has been authorized  for detection and/or diagnosis of SARS-CoV-2 by FDA under an Emergency Use Authorization (EUA). This EUA will remain in effect (meaning this test can be used) for the duration of the COVID-19 declaration under Section 564(b)(1) of the Act, 21 U.S.C. section 360bbb-3(b)(1), unless the authorization is terminated or revoked.     Resp Syncytial Virus by PCR NEGATIVE NEGATIVE Final    Comment: (NOTE) Fact Sheet for Patients: BloggerCourse.com  Fact Sheet for Healthcare  Providers: SeriousBroker.it  This test is not yet approved or cleared by the [place]  FDA and has been authorized for detection and/or diagnosis of SARS-CoV-2 by FDA under an Emergency Use Authorization (EUA). This EUA will remain in effect (meaning this test can be used) for the duration of the COVID-19 declaration under Section 564(b)(1) of the Act, 21 U.S.C. section 360bbb-3(b)(1), unless the authorization is terminated or revoked.  Performed at [facility], [address] [place], [place] [address]     Labs: CBC: Recent Labs  Lab 04/06/24 2206 04/07/24 0327 04/07/24 2045 04/08/24 0500  WBC 9.2 11.8* 10.2 16.6*  HGB 12.7 12.8 13.9 12.3  HCT 39.4 39.5 41.2 36.9  MCV 90.8 90.4 88.0 87.4  PLT 301 273 327 315   Basic Metabolic Panel: Recent Labs  Lab 04/06/24 2206 04/07/24 0327 04/07/24 2045  NA 128* 131*  --   K 4.1 4.2  --   CL 98 99  --   CO2 22 23  --   GLUCOSE 110* 118*  --   BUN 9 9  --   CREATININE 0.86 0.90 1.00  CALCIUM  8.9 9.1  --    Liver Function Tests: Recent Labs  Lab 04/07/24 0327  AST 20  ALT 12  ALKPHOS 116  BILITOT 0.8  PROT 6.8  ALBUMIN 3.4*    Discharge time spent: 35 minutes.  Signed: Elgin Lam, MD [facility] 04/08/2024

## 2024-04-08 NOTE — Discharge Instructions (Signed)
 Kaitlyn Jimenez,  You were in the hospital with chest pain. You had a heart catheterization which revealed moderate disease but nothing significant enough for a stent. The cardiologist has recommended you add Plavix  to your medication regimen. Please follow-up with your PCP and cardiologist.

## 2024-04-08 NOTE — TOC CM/SW Note (Addendum)
 CSW spoke with patient at bedside. CSW offered patient [employer]. Patient accepted. All questions answered. No further questions reported at this time.

## 2024-04-08 NOTE — Progress Notes (Addendum)
 Progress Note  Patient Name: Kaitlyn Jimenez Date of Encounter: 04/08/2024  [facility] Cardiologist: Lamar Fitch, MD   Patient Profile     Subjective   No further CP.  Cath yesterday with patent prox RCA stent with new 50% mid to distal RCA stenosis, 30% prox LCx, 50% IRS of prox to mid LAD stents unchanged from 2020 cath. LVEDP elevated at . Medical management recommended.    Inpatient Medications    Scheduled Meds:  aspirin  EC  81 mg Oral Daily   famotidine   20 mg Oral BID   free water   500 mL Oral Once   free water   500 mL Oral Once   heparin   5,000 Units Subcutaneous Q8H   irbesartan   150 mg Oral Daily   levothyroxine   88 mcg Oral QAC breakfast   metoprolol  tartrate  50 mg Oral BID   rosuvastatin   20 mg Oral Daily   sodium chloride  flush  3 mL Intravenous Q12H   sodium chloride  flush  3 mL Intravenous Q12H   sodium chloride  flush  3 mL Intravenous Q12H   Continuous Infusions:  sodium chloride      PRN Meds: sodium chloride , acetaminophen , hydrALAZINE , levalbuterol , nitroGLYCERIN , ondansetron  **OR** ondansetron  (ZOFRAN ) IV, sodium chloride  flush, sodium chloride  flush   Vital Signs    Vitals:   04/07/24 2245 04/07/24 2332 04/08/24 0145 04/08/24 0428  BP: (!) 154/71 (!) 118/46 (!) 165/72 (!) 119/53  Pulse: 77 73 73   Resp:  16  16  Temp:  98.2 F (36.8 C)  97.7 F (36.5 C)  TempSrc:  Oral  Axillary  SpO2: 91% 92% 96%   Weight:      Height:        Intake/Output Summary (Last 24 hours) at 04/08/2024 0843 Last data filed at 04/07/2024 2145 Gross per 24 hour  Intake 240 ml  Output --  Net 240 ml      04/07/2024    4:00 AM 08/31/2023    2:09 PM 02/24/2023    1:47 PM  Last 3 Weights  Weight (lbs) 176 lb 181 lb 9.6 oz 184 lb 6.4 oz  Weight (kg) 79.833 kg 82.373 kg 83.643 kg      Telemetry    Normal sinus rhythm- Personally Reviewed  ECG    Normal sinus rhythm with left bundle branch block unchanged from 08/31/2023- Personally  Reviewed  Physical Exam   GEN: Well nourished, well developed in no acute distress HEENT: Normal NECK: No JVD; No carotid bruits LYMPHATICS: No lymphadenopathy CARDIAC:RRR, no murmurs, rubs, gallops RESPIRATORY:  Clear to auscultation without rales, wheezing or rhonchi  ABDOMEN: Soft, non-tender, non-distended MUSCULOSKELETAL:  No edema; No deformity  SKIN: Warm and dry right radial cath site clean and dry with no hematoma or ecchymosis NEUROLOGIC:  Alert and oriented x 3 PSYCHIATRIC:  Normal affect  Labs    High Sensitivity Troponin:   Recent Labs  Lab 04/06/24 2206 04/06/24 2359 04/07/24 0326 04/07/24 0717  TROPONINIHS 14 48* 68* 92*      Chemistry Recent Labs  Lab 04/06/24 2206 04/07/24 0327 04/07/24 2045  NA 128* 131*  --   K 4.1 4.2  --   CL 98 99  --   CO2 22 23  --   GLUCOSE 110* 118*  --   BUN 9 9  --   CREATININE 0.86 0.90 1.00  CALCIUM  8.9 9.1  --   PROT  --  6.8  --   ALBUMIN  --  3.4*  --   AST  --  20  --   ALT  --  12  --   ALKPHOS  --  116  --   BILITOT  --  0.8  --   GFRNONAA >60 >60 59*  ANIONGAP 8 9  --      Hematology Recent Labs  Lab 04/07/24 0327 04/07/24 2045 04/08/24 0500  WBC 11.8* 10.2 16.6*  RBC 4.37 4.68 4.22  HGB 12.8 13.9 12.3  HCT 39.5 41.2 36.9  MCV 90.4 88.0 87.4  MCH 29.3 29.7 29.1  MCHC 32.4 33.7 33.3  RDW 12.5 12.4 12.5  PLT 273 327 315    BNP Recent Labs  Lab 04/06/24 2359  BNP 89.9     DDimer No results for input(s): DDIMER in the last 168 hours.   CHA2DS2-VASc Score = 4   This indicates a 4.8% annual risk of stroke. The patient's score is based upon: CHF History: 0 HTN History: 1 Diabetes History: 0 Stroke History: 0 Vascular Disease History: 1 Age Score: 1 Gender Score: 1      Radiology    CARDIAC CATHETERIZATION Result Date: 04/07/2024 Images from the original result were not included. Coronary angiography 04/07/2024: LM: Normal LAD: Prox-mid LAD stents w/ 50% restenosis, unchanged  compared to 2020 cath Lcx: Proximal 30% disease RCA: Patent proximal RCA stent, with no restenosis           Mid 50% disease, new since 2020 cath LVEDP 27 mmHg Conclusion: Moderate nonobstructive coronary artery disease. No new severe lesion compared to 2020 cath. Mildly elevated troponin without any critical lesion likely due to hypertensive urgency. Recommend aggressive management of hypertension, consider secondary workup for causes such as renal artery stenosis if clinically indicated. Patient has not been on anticoagulation for A-fib per her wishes (reported side effects to Xarelto  and Eliquis  in the past). If patient is not going to be on anticoagulation for PAF, consider 1 year DAPT w/Aspirin  and plavix  for medical management of NSTEMI. If anticoagulation for Afib considered at any point, could skip Aspirin . Newman JINNY Lawrence, MD   CT CHEST WO CONTRAST Result Date: 04/07/2024 CLINICAL DATA:  Chest pain for 2 days EXAM: CT CHEST WITHOUT CONTRAST TECHNIQUE: Multidetector CT imaging of the chest was performed following the standard protocol without IV contrast. RADIATION DOSE REDUCTION: This exam was performed according to the departmental dose-optimization program which includes automated exposure control, adjustment of the mA and/or kV according to patient size and/or use of iterative reconstruction technique. COMPARISON:  Plain film from the previous day. FINDINGS: Cardiovascular: Atherosclerotic calcifications of the thoracic aorta are noted. No aneurysmal dilatation is seen. No cardiac enlargement is noted. Coronary calcification and stenting is noted. No pericardial effusion is seen. Mediastinum/Nodes: Thoracic inlet is within normal limits. Scattered small mediastinal nodes are seen. No sizable adenopathy is noted. The esophagus as visualized is within normal limits. Lungs/Pleura: Lungs are well aerated bilaterally. Diffuse emphysematous changes and mosaic attenuation is noted. Mild subpleural  fibrosis is noted in the upper lobes. Biapical pleural and parenchymal scarring is seen. Minimal right-sided pleural effusion is seen. Upper Abdomen: Gallbladder has been surgically removed. No acute upper abdominal abnormality is seen. Musculoskeletal: Postsurgical changes in the lower cervical spine are seen. No acute rib abnormality is noted. No compression deformity is seen. IMPRESSION: Chronic changes without acute abnormality. Aortic Atherosclerosis (ICD10-I70.0) and Emphysema (ICD10-J43.9). Electronically Signed   By: Oneil Devonshire M.D.   On: 04/07/2024 03:53   DG Chest 2 View  Result Date: 04/06/2024 CLINICAL DATA:  Chest pain EXAM: CHEST - 2 VIEW COMPARISON:  Chest x-ray 03/01/2019 FINDINGS: There are increased interstitial markings diffusely throughout both lungs. There is no lung consolidation, pleural effusion or pneumothorax. The cardiomediastinal silhouette is within normal limits. No acute fractures are seen. Cervical spinal fusion plate is present. IMPRESSION: Increased interstitial markings diffusely throughout both lungs, which may represent pulmonary edema or atypical infection. Electronically Signed   By: Greig Pique M.D.   On: 04/06/2024 23:27    Patient Profile     73 y.o. female  with CAD with PCI (2007, LAD 2013, pRCA and LAD in 02/2019), HLD, HTN, paroxysmal AF who is being seen today for the evaluation of chest pain at the request of Lynwood Roosevelt, [place].   Assessment & Plan    NSTEMI ASCAD Chronic left bundle branch block Hyperlipidemia - Patient has a remote history of PCI 2007, LAD in 2013 and then PCI of the proximal RCA and LAD in 2020 - Presented with 24-hour history of intermittent chest pain in the setting of elevated BP at initially 200/102 mmHg.  Pain has become more persistent over the past 24 hours and currently is 6 out of 10 radiating up into her left shoulder and to her throat. - High-sensitivity troponin mildly elevated at 14>> 48>> 68>> 92 ? ACS vs. Demand  ischemia from hypertensive urgency - Pain somewhat different from what she had in the past but still very worrisome for ischemic origin - EKG shows chronic left bundle branch block unchanged from January 2025 - She has equal pulses distally and no evidence of aortic aneurysm on noncontrasted chest CT so do not think this is aortic dissection - cath yesterday with patent prox RCA stent with new 50% mid to distal RCA stenosis, 30% prox LCx, 50% IRS of prox to mid LAD stents unchanged from 2020 cath. LVEDP elevated at >>Medical management recommended.  - no further CP - continue ASA 81mg  daily, Lopressor  50mg  BID  - start Plavix  75mg  daily for moderate nonobstructive CAD in setting of enzyme bump (although her elevated trop likely related to hypertensive urgency with demand ischemia) - Lipids this admission showed LDL 155, HDL 47 and triglycerides 78; HbA1C 5.3% - Crestor  increased to 20mg  daily  - Will need repeat FLP and ALT in 6 weeks - check 2D echo>>pending  Paroxysmal atrial fibrillation - Status post remote A-fib ablation  - maintaining NSR on tele - CHA2DS2-VASc score 4 (age greater than 65, hypertension, CAD, female)>> refused anticoagulation in the past  Hypertension - Hypertensive urgency with BP high as 200/102 mmHg over the past 24 hours - BP controlled at 119/18mmHg this am - continue Lopressor  50mg  BID - she was on Valsartan  160mg  daily at home >>currently getting Irbesartan  150mg  daily here - check renal duplex today to rule out RAS  If echo is normal then ok for discharge from cardiac standpoint.   [facility] will sign off.   Medication Recommendations:  ASA 81mg  daily, Plavix  75mg  daily, Lopressor  50mg  BID, Valsartan  160mg  daily, Crestor  20mg  daily Other recommendations (labs, testing, etc):  FLP and ALT In 6 weeks Follow up as an outpatient:  Dr. Krasowski in 7-10 days   I spent 30 minutes caring for this patient today face to face, ordering and reviewing  labs, reviewing records from cardiac cath 8/14, 2D echo , seeing the patient, documenting in the record, and arranging for a renal duplex  For questions or updates, please contact [facility] Please consult [email or web address] for contact info under        Signed, Wilbert Bihari, MD  04/08/2024,

## 2024-04-08 NOTE — TOC CM/SW Note (Signed)
 Transition of Care [facility]) - Inpatient Brief Assessment   Patient Details  Name: Kaitlyn Jimenez MRN: [id number] Date of Birth: 1951/06/01  Transition of Care [facility]) CM/SW Contact:    Sudie Erminio Deems, RN Phone Number: 04/08/2024, 11:53 AM   Clinical Narrative: Patient presented for chest pain-Nstemi. PTA patient states she was from home with spouse and son. Patient has insurance and PCP. Patient states she has transportation to appointments. Family to provide transportation home in private vehicle. No home needs identified during the visit.    Transition of Care Asessment: Insurance and Status: Insurance coverage has been reviewed Patient has primary care physician: Yes Home environment has been reviewed: reviewed Prior level of function:: independent Prior/Current Home Services: No current home services Social Drivers of Health Review: SDOH reviewed no interventions necessary Readmission risk has been reviewed: Yes Transition of care needs: no transition of care needs at this time

## 2024-04-09 LAB — LIPOPROTEIN A (LPA): Lipoprotein (a): 170.9 nmol/L — ABNORMAL HIGH (ref <75.0)

## 2024-04-14 ENCOUNTER — Encounter: Payer: Self-pay | Admitting: Cardiology

## 2024-04-14 ENCOUNTER — Ambulatory Visit: Attending: Cardiology | Admitting: Cardiology

## 2024-04-14 ENCOUNTER — Ambulatory Visit (INDEPENDENT_AMBULATORY_CARE_PROVIDER_SITE_OTHER)

## 2024-04-14 VITALS — BP 160/80 | HR 69 | Ht 61.0 in | Wt 183.0 lb

## 2024-04-14 DIAGNOSIS — I2583 Coronary atherosclerosis due to lipid rich plaque: Secondary | ICD-10-CM

## 2024-04-14 DIAGNOSIS — I447 Left bundle-branch block, unspecified: Secondary | ICD-10-CM | POA: Diagnosis not present

## 2024-04-14 DIAGNOSIS — M79601 Pain in right arm: Secondary | ICD-10-CM

## 2024-04-14 DIAGNOSIS — I480 Paroxysmal atrial fibrillation: Secondary | ICD-10-CM | POA: Diagnosis not present

## 2024-04-14 DIAGNOSIS — I10 Essential (primary) hypertension: Secondary | ICD-10-CM | POA: Diagnosis not present

## 2024-04-14 DIAGNOSIS — Z8679 Personal history of other diseases of the circulatory system: Secondary | ICD-10-CM

## 2024-04-14 DIAGNOSIS — I2510 Atherosclerotic heart disease of native coronary artery without angina pectoris: Secondary | ICD-10-CM

## 2024-04-14 MED ORDER — FUROSEMIDE 20 MG PO TABS: 20.0000 mg | ORAL_TABLET | Freq: Every day | ORAL | 3 refills | Status: DC

## 2024-04-14 NOTE — Patient Instructions (Addendum)
 Medication Instructions:   START: Lasix  20mg  1 tablet daily   Lab Work: None Ordered If you have labs (blood work) drawn today and your tests are completely normal, you will receive your results only by: MyChart Message (if you have MyChart) OR A paper copy in the mail If you have any lab test that is abnormal or we need to change your treatment, we will call you to review the results.   Testing/Procedures: None Ordered   Follow-Up: At [facility], you and your health needs are our priority.  As part of our continuing mission to provide you with exceptional heart care, we have created designated Provider Care Teams.  These Care Teams include your primary Cardiologist (physician) and Advanced Practice Providers (APPs -  Physician Assistants and Nurse Practitioners) who all work together to provide you with the care you need, when you need it.  We recommend signing up for the patient portal called MyChart.  Sign up information is provided on this After Visit Summary.  MyChart is used to connect with patients for Virtual Visits (Telemedicine).  Patients are able to view lab/test results, encounter notes, upcoming appointments, etc.  Non-urgent messages can be sent to your provider as well.   To learn more about what you can do with MyChart, go to [email or web address].    Your next appointment:   3 month(s)  The format for your next appointment:   In Person  Provider:   Lamar Fitch, MD    Other Instructions NA

## 2024-04-14 NOTE — Progress Notes (Signed)
 Cardiology Office Note:    Date:  04/14/2024   ID:  Kaitlyn Jimenez, DOB 10-31-1950, MRN [id number]  PCP:  Pandora Therisa RAMAN, NP  Cardiologist:  Lamar Fitch, MD    Referring MD: Pandora Therisa RAMAN, NP   Chief Complaint  Patient presents with   Hospitalization Follow-up    History of Present Illness:    Kaitlyn Jimenez is a 73 y.o. female past medical history significant for coronary artery disease, she did have PTCA and stenting done in 2007, 2013 and then 2020.  Recently she ended going to the hospital because of high blood pressure elevated troponin cardiac catheterization has been done which showed no evidence of obstructive lesions.  Additional problem clued PVD, paroxysmal atrial fibrillation, dyslipidemia, essential hypertension.  Comes today to my office unhappy about results of cardiac catheterization she gets significant swelling hematoma develop in the right hand.  Motion is intact.  Still some pain in the top, refill is decent I feel pulse up to the area of puncture of the radial artery and more distally I do not.  Will do ultrasounds of her radial artery today.  Cardiac wise she is doing fine.  She is upset and there that is why she get high blood pressure but otherwise she said her blood pressure at home is decent.  Past Medical History:  Diagnosis Date   Atypical chest pain 01/07/2019   Coronary artery disease involving native coronary artery of native heart with angina pectoris (HCC) 07/13/2017   Stenting in 2007, 2013 to LAD, last cardiac cath in 02/2019 w/ prox RCA stent and 50% ISR of LAD stent   Dyslipidemia 07/13/2017   Essential hypertension 07/13/2017   Gastroesophageal reflux disease without esophagitis 05/25/2020   Hypertension    MI (myocardial infarction) (HCC)    MI (myocardial infarction) (HCC)    Other hyperlipidemia 05/25/2020   Other specified hypothyroidism 05/25/2020   Primary hypertension 05/25/2020   Thyroid  disease    Unstable angina (HCC) 03/01/2019     Past Surgical History:  Procedure Laterality Date   ABDOMINAL HYSTERECTOMY     ATRIAL FIBRILLATION ABLATION N/A 06/21/2021   Procedure: ATRIAL FIBRILLATION ABLATION;  Surgeon: Inocencio Soyla Lunger, MD;  [facility]: [facility] INVASIVE CV LAB;  Service: Cardiovascular;  Laterality: N/A;   CARDIAC CATHETERIZATION     CHOLECYSTECTOMY     CORONARY ANGIOPLASTY WITH STENT PLACEMENT     LEFT HEART CATH AND CORONARY ANGIOGRAPHY N/A 03/02/2019   Procedure: LEFT HEART CATH AND CORONARY ANGIOGRAPHY;  Surgeon: Wonda Sharper, MD;  Location: [facility] INVASIVE CV LAB;  Service: Cardiovascular;  Laterality: N/A;   LEFT HEART CATH AND CORONARY ANGIOGRAPHY N/A 04/07/2024   Procedure: LEFT HEART CATH AND CORONARY ANGIOGRAPHY;  Surgeon: Elmira Newman PARAS, MD;  Location: [facility] INVASIVE CV LAB;  Service: Cardiovascular;  Laterality: N/A;    Current Medications: Current Meds  Medication Sig   ALPRAZolam  (XANAX ) 1 MG tablet Take 1 mg by mouth 2 (two) times daily.   aspirin  EC 81 MG tablet Take 1 tablet (81 mg total) by mouth daily. Swallow whole.   clopidogrel  (PLAVIX ) 75 MG tablet Take 1 tablet (75 mg total) by mouth daily.   cyanocobalamin  (,VITAMIN B-12,) 1000 MCG/ML injection Inject 1,000 mcg into the muscle every 30 (thirty) days.   EPINEPHrine 0.3 mg/0.3 mL IJ SOAJ injection Inject 0.3 mg into the muscle as needed for anaphylaxis.   famotidine  (PEPCID ) 20 MG tablet Take 20 mg by mouth daily as needed for heartburn or indigestion.  fluticasone (FLONASE) 50 MCG/ACT nasal spray Place 1 spray into both nostrils daily as needed for allergies.   levothyroxine  (SYNTHROID ) 88 MCG tablet Take 88 mcg by mouth daily before breakfast.   metoprolol  tartrate (LOPRESSOR ) 50 MG tablet Take 1 tablet (50 mg total) by mouth 2 (two) times daily.   nitroGLYCERIN  (NITROSTAT ) 0.4 MG SL tablet Place 1 tablet (0.4 mg total) under the tongue every 5 (five) minutes as needed for chest pain.   polyvinyl alcohol (LIQUIFILM TEARS) 1.4 %  ophthalmic solution Place 1 drop into both eyes as needed for dry eyes.   rosuvastatin  (CRESTOR ) 5 MG tablet Take 10 mg by mouth daily.   sodium chloride  (OCEAN) 0.65 % SOLN nasal spray Place 1 spray into both nostrils as needed for congestion.   triamcinolone  ointment (KENALOG ) 0.1 % Apply 1 application topically as needed (demititis).   valsartan  (DIOVAN ) 160 MG tablet Take 1 tablet (160 mg total) by mouth daily.   Vitamin D, Ergocalciferol, (DRISDOL) 1.25 MG (50000 UT) CAPS capsule Take 50,000 Units by mouth every Wednesday.     Allergies:   Bee venom, Penciclovir, Shellfish-derived products, Sulfa antibiotics, Brilinta  [ticagrelor ], Eliquis  [apixaban ], Prasugrel , Iodinated contrast media, Penicillins, and Ramipril   Social History   Socioeconomic History   Marital status: Married    Spouse name: Not on file   Number of children: Not on file   Years of education: Not on file   Highest education level: Not on file  Occupational History   Not on file  Tobacco Use   Smoking status: Former   Smokeless tobacco: Never  Vaping Use   Vaping status: Never Used  Substance and Sexual Activity   Alcohol use: No   Drug use: No   Sexual activity: Not on file  Other Topics Concern   Not on file  Social History Narrative   Not on file   Social Drivers of Health   Financial Resource Strain: Not on file  Food Insecurity: No Food Insecurity (04/07/2024)   Hunger Vital Sign    Worried About Running Out of Food in the Last Year: Never true    Ran Out of Food in the Last Year: Never true  Transportation Needs: No Transportation Needs (04/07/2024)   PRAPARE - Administrator, Civil Service (Medical): No    Lack of Transportation (Non-Medical): No  Physical Activity: Not on file  Stress: Not on file  Social Connections: Moderately Isolated (04/08/2024)   Social Connection and Isolation Panel    Frequency of Communication with Friends and Family: More than three times a week     Frequency of Social Gatherings with Friends and Family: Twice a week    Attends Religious Services: Never    Database administrator or Organizations: No    Attends Engineer, structural: Never    Marital Status: Married     Family History: The patient's family history includes Arrhythmia in her sister; Congestive Heart Failure in her brother; Heart attack in her brother and father; Heart disease in her maternal uncle, mother, and paternal uncle; Rheum arthritis in her brother. ROS:   Please see the history of present illness.    All 14 point review of systems negative except as described per history of present illness  EKGs/Labs/Other Studies Reviewed:         Recent Labs: 04/06/2024: B Natriuretic Peptide 89.9 04/07/2024: ALT 12; BUN 9; Creatinine, Ser 1.00; Potassium 4.2; Sodium 131 04/08/2024: Hemoglobin 12.3; Platelets 315  Recent  Lipid Panel    Component Value Date/Time   CHOL 218 (H) 04/07/2024 0327   CHOL 216 (H) 06/13/2022 1656   TRIG 78 04/07/2024 0327   HDL 47 04/07/2024 0327   HDL 59 06/13/2022 1656   CHOLHDL 4.6 04/07/2024 0327   VLDL 16 04/07/2024 0327   LDLCALC 155 (H) 04/07/2024 0327   LDLCALC 140 (H) 06/13/2022 1656    Physical Exam:    VS:  BP (!) 160/80   Pulse 69   Ht 5' 1 (1.549 m)   Wt 183 lb (83 kg)   SpO2 98%   BMI 34.58 kg/m     Wt Readings from Last 3 Encounters:  04/14/24 183 lb (83 kg)  04/07/24 176 lb (79.8 kg)  08/31/23 181 lb 9.6 oz (82.4 kg)     GEN:  Well nourished, well developed in no acute distress HEENT: Normal NECK: No JVD; No carotid bruits LYMPHATICS: No lymphadenopathy CARDIAC: RRR, no murmurs, no rubs, no gallops RESPIRATORY:  Clear to auscultation without rales, wheezing or rhonchi  ABDOMEN: Soft, non-tender, non-distended MUSCULOSKELETAL:  No edema; No deformity  SKIN: Warm and dry LOWER EXTREMITIES: no swelling NEUROLOGIC:  Alert and oriented x 3 PSYCHIATRIC:  Normal affect   ASSESSMENT:    1.  Coronary artery disease due to lipid rich plaque   2. LBBB (left bundle branch block)   3. Essential hypertension   4. PAF (paroxysmal atrial fibrillation) (HCC)   5. History of CAD (coronary artery disease)    PLAN:    In order of problems listed above:  Coronary disease review cardiac catheterization showing nonobstructive lesion continue risk modifications. Left bundle branch block.  Noted.  Chronic. Essential hypertension elevated today but she said she is very upset she said at home it is always good we will continue monitoring she is scheduled to have renal ultrasound. Paroxysmal atrial fibrillation.  Still does not want to take any anticoagulation. Swelling and hematoma of the puncture of the radial artery with radial ultrasound   Medication Adjustments/Labs and Tests Ordered: Current medicines are reviewed at length with the patient today.  Concerns regarding medicines are outlined above.  No orders of the defined types were placed in this encounter.  Medication changes: No orders of the defined types were placed in this encounter.   Signed, Lamar DOROTHA Fitch, MD, [facility] 04/14/2024 2:13 PM    [facility]

## 2024-04-20 ENCOUNTER — Ambulatory Visit: Payer: Self-pay | Admitting: Cardiology

## 2024-05-04 NOTE — Telephone Encounter (Signed)
-----   Message from Lamar Fitch sent at 04/20/2024 12:24 PM EDT ----- Ultrasound of the puncture site for cardiac catheterization was no evidence of pseudoaneurysm or aneurysm.  I think I told her about ----- Message ----- From: Interface, [employer] Sent: 04/14/2024   3:13 PM EDT To: Lamar JINNY Fitch, MD

## 2024-05-05 ENCOUNTER — Telehealth: Payer: Self-pay

## 2024-05-05 NOTE — Telephone Encounter (Signed)
 Patient is returning call. Please advise?

## 2024-05-05 NOTE — Telephone Encounter (Signed)
 [place] Results reviewed with pt as per Dr. Vanetta Shawl note.  Pt verbalized understanding and had no additional questions. Routed to PCP

## 2024-05-20 ENCOUNTER — Ambulatory Visit (HOSPITAL_BASED_OUTPATIENT_CLINIC_OR_DEPARTMENT_OTHER)
Admission: EM | Admit: 2024-05-20 | Discharge: 2024-05-20 | Disposition: A | Discharge status: Home / Self Care | Attending: Family Medicine | Admitting: Family Medicine

## 2024-05-20 ENCOUNTER — Ambulatory Visit (HOSPITAL_COMMUNITY): Payer: Self-pay

## 2024-05-20 ENCOUNTER — Encounter (HOSPITAL_BASED_OUTPATIENT_CLINIC_OR_DEPARTMENT_OTHER): Payer: Self-pay

## 2024-05-20 ENCOUNTER — Other Ambulatory Visit (HOSPITAL_BASED_OUTPATIENT_CLINIC_OR_DEPARTMENT_OTHER): Payer: Self-pay

## 2024-05-20 ENCOUNTER — Ambulatory Visit (INDEPENDENT_AMBULATORY_CARE_PROVIDER_SITE_OTHER): Admitting: Radiology

## 2024-05-20 DIAGNOSIS — R0789 Other chest pain: Secondary | ICD-10-CM | POA: Diagnosis not present

## 2024-05-20 DIAGNOSIS — R079 Chest pain, unspecified: Secondary | ICD-10-CM | POA: Diagnosis not present

## 2024-05-20 MED ORDER — TIZANIDINE HCL 4 MG PO TABS
4.0000 mg | ORAL_TABLET | Freq: Two times a day (BID) | ORAL | 0 refills | Status: DC | PRN
  Filled 2024-05-20: qty 20, 10d supply, fill #0

## 2024-05-20 NOTE — Discharge Instructions (Signed)
 This is most likely chest wall pain.  Your x-ray did not show any concerns. Recommend Tylenol  for pain and I have prescribed a low-dose muscle relaxer. He can do heat and ice to the area.  Follow-up with your doctor as needed

## 2024-05-20 NOTE — ED Provider Notes (Signed)
 [facility]    CSN: [id number] Arrival date & time: 05/20/24  1452      History   Chief Complaint Chief Complaint  Patient presents with   Chest Pain    HPI Kaitlyn Jimenez is a 73 y.o. female.   Patient is a 73 year old female who presents today for chest wall pain.  Patient reports 1 week ago,was bending over to pick up some soup on a lower shelf and felt a pop to her left shoulder and left side of her chest area. States pain palpable and able to be reproduced with palpation and movement. Patient states she has 4 stents with last one placed in 2020.  Patient unsure if this is at all related to her heart or she just moved wrong. No shortness of breath, no sweating. Hurts with cough, sneezing, and deep breath. Patient skin p/w/d. No cough, resp even and unlabored.    Chest Pain   Past Medical History:  Diagnosis Date   Atypical chest pain 01/07/2019   Coronary artery disease involving native coronary artery of native heart with angina pectoris 07/13/2017   Stenting in 2007, 2013 to LAD, last cardiac cath in 02/2019 w/ prox RCA stent and 50% ISR of LAD stent   Dyslipidemia 07/13/2017   Essential hypertension 07/13/2017   Gastroesophageal reflux disease without esophagitis 05/25/2020   Hypertension    MI (myocardial infarction) (HCC)    MI (myocardial infarction) (HCC)    Other hyperlipidemia 05/25/2020   Other specified hypothyroidism 05/25/2020   Primary hypertension 05/25/2020   Thyroid  disease    Unstable angina (HCC) 03/01/2019    Patient Active Problem List   Diagnosis Date Noted   History of CAD (coronary artery disease) 04/07/2024   Peripheral vascular disease 04/07/2024   Non-ST elevation (NSTEMI) myocardial infarction (HCC) 04/07/2024   Acute hypoxic respiratory failure (HCC) 04/07/2024   Pure hypercholesterolemia 04/07/2024   LBBB (left bundle branch block) 02/24/2023   Left carotid bruit 02/24/2022   Referred otalgia of left ear 02/24/2022   Status  post ablation of atrial fibrillation 09/02/2021   PAF (paroxysmal atrial fibrillation) (HCC) 07/22/2021   Secondary hypercoagulable state 07/22/2021   Palpitations 10/30/2020   Thyroid  disease    Hypertension    MI (myocardial infarction) (HCC)    Hypothyroidism 05/25/2020   Primary hypertension 05/25/2020   Gastroesophageal reflux disease without esophagitis 05/25/2020   Other hyperlipidemia 05/25/2020   Unstable angina (HCC) 03/01/2019   Chest pain 01/07/2019   Coronary artery disease due to lipid rich plaque 07/13/2017   Essential hypertension 07/13/2017   Dyslipidemia 07/13/2017    Past Surgical History:  Procedure Laterality Date   ABDOMINAL HYSTERECTOMY     ATRIAL FIBRILLATION ABLATION N/A 06/21/2021   Procedure: ATRIAL FIBRILLATION ABLATION;  Surgeon: Inocencio Soyla Lunger, MD;  Location: [facility] INVASIVE CV LAB;  Service: Cardiovascular;  Laterality: N/A;   CARDIAC CATHETERIZATION     CHOLECYSTECTOMY     CORONARY ANGIOPLASTY WITH STENT PLACEMENT     LEFT HEART CATH AND CORONARY ANGIOGRAPHY N/A 03/02/2019   Procedure: LEFT HEART CATH AND CORONARY ANGIOGRAPHY;  Surgeon: Wonda Sharper, MD;  Location: [facility] INVASIVE CV LAB;  Service: Cardiovascular;  Laterality: N/A;   LEFT HEART CATH AND CORONARY ANGIOGRAPHY N/A 04/07/2024   Procedure: LEFT HEART CATH AND CORONARY ANGIOGRAPHY;  Surgeon: Elmira Newman PARAS, MD;  Location: [facility] INVASIVE CV LAB;  Service: Cardiovascular;  Laterality: N/A;    OB History   No obstetric history on file.  Home Medications    Prior to Admission medications   Medication Sig Start Date End Date Taking? Authorizing Provider  tiZANidine  (ZANAFLEX ) 4 MG tablet Take 1 tablet (4 mg total) by mouth every 12 (twelve) hours as needed for muscle spasms. 05/20/24  Yes Wylie Russon A, FNP  ALPRAZolam  (XANAX ) 1 MG tablet Take 1 mg by mouth 2 (two) times daily.    [provider]  aspirin  EC 81 MG tablet Take 1 tablet (81 mg total) by mouth daily. Swallow  whole. 05/02/21   Camnitz, Soyla Lunger, MD  clopidogrel  (PLAVIX ) 75 MG tablet Take 1 tablet (75 mg total) by mouth daily. 04/09/24   Briana Elgin LABOR, MD  cyanocobalamin  (,VITAMIN B-12,) 1000 MCG/ML injection Inject 1,000 mcg into the muscle every 30 (thirty) days.    [provider]  EPINEPHrine 0.3 mg/0.3 mL IJ SOAJ injection Inject 0.3 mg into the muscle as needed for anaphylaxis.    [provider]  famotidine  (PEPCID ) 20 MG tablet Take 20 mg by mouth daily as needed for heartburn or indigestion.    [provider]  fluticasone (FLONASE) 50 MCG/ACT nasal spray Place 1 spray into both nostrils daily as needed for allergies. 03/23/20   [provider]  furosemide  (LASIX ) 20 MG tablet Take 1 tablet (20 mg total) by mouth daily. 04/14/24 07/13/24  Krasowski, Robert J, MD  levothyroxine  (SYNTHROID ) 88 MCG tablet Take 88 mcg by mouth daily before breakfast.    [provider]  metoprolol  tartrate (LOPRESSOR ) 50 MG tablet Take 1 tablet (50 mg total) by mouth 2 (two) times daily. 01/06/19   Krasowski, Robert J, MD  nitroGLYCERIN  (NITROSTAT ) 0.4 MG SL tablet Place 1 tablet (0.4 mg total) under the tongue every 5 (five) minutes as needed for chest pain. 03/03/19   Marcine Catalan M, PA-C  polyvinyl alcohol (LIQUIFILM TEARS) 1.4 % ophthalmic solution Place 1 drop into both eyes as needed for dry eyes.    [provider]  rosuvastatin  (CRESTOR ) 5 MG tablet Take 10 mg by mouth daily. 08/03/23   [provider]  sodium chloride  (OCEAN) 0.65 % SOLN nasal spray Place 1 spray into both nostrils as needed for congestion.    [provider]  triamcinolone  ointment (KENALOG ) 0.1 % Apply 1 application topically as needed (demititis). 01/19/20   [provider]  valsartan  (DIOVAN ) 160 MG tablet Take 1 tablet (160 mg total) by mouth daily. 02/23/20   Krasowski, Robert J, MD  Vitamin D, Ergocalciferol, (DRISDOL) 1.25 MG (50000 UT) CAPS capsule Take  50,000 Units by mouth every Wednesday.    [provider]    Family History Family History  Problem Relation Age of Onset   Heart disease Mother    Heart attack Father    Heart attack Brother    Congestive Heart Failure Brother    Rheum arthritis Brother    Arrhythmia Sister    Heart disease Maternal Uncle    Heart disease Paternal Uncle     Social History Social History   Tobacco Use   Smoking status: Former   Smokeless tobacco: Never  Advertising account planner   Vaping status: Never Used  Substance Use Topics   Alcohol use: No   Drug use: No     Allergies   Bee venom, Penciclovir, Shellfish-derived products, Sulfa antibiotics, Brilinta  [ticagrelor ], Eliquis  [apixaban ], Prasugrel , Iodinated contrast media, Penicillins, and Ramipril   Review of Systems Review of Systems  Cardiovascular:  Positive for chest pain.     Physical  Exam Triage Vital Signs ED Triage Vitals  Encounter Vitals Group     BP 05/20/24 1510 (!) 189/91     Girls Systolic BP Percentile --      Girls Diastolic BP Percentile --      Boys Systolic BP Percentile --      Boys Diastolic BP Percentile --      Pulse Rate 05/20/24 1510 78     Resp 05/20/24 1510 20     Temp 05/20/24 1510 98.3 F (36.8 C)     Temp Source 05/20/24 1510 Oral     SpO2 05/20/24 1510 96 %     Weight --      Height --      Head Circumference --      Peak Flow --      Pain Score 05/20/24 1513 5     Pain Loc --      Pain Education --      Exclude from Growth Chart --    No data found.  Updated Vital Signs BP (!) 170/86 (BP Location: Right Arm)   Pulse 78   Temp 98.3 F (36.8 C) (Oral)   Resp 20   SpO2 96%   Visual Acuity Right Eye Distance:   Left Eye Distance:   Bilateral Distance:    Right Eye Near:   Left Eye Near:    Bilateral Near:     Physical Exam Vitals and nursing note reviewed.  Constitutional:      General: She is not in acute distress.    Appearance: Normal appearance. She is not  ill-appearing, toxic-appearing or diaphoretic.  Cardiovascular:     Rate and Rhythm: Normal rate and regular rhythm.     Pulses: Normal pulses.     Heart sounds: Normal heart sounds.  Pulmonary:     Effort: Pulmonary effort is normal.  Musculoskeletal:        General: Normal range of motion.     Comments: Chest pain that is reproducible upon palpation and deep breathing.  Skin:    General: Skin is warm and dry.  Neurological:     Mental Status: She is alert.  Psychiatric:        Mood and Affect: Mood normal.      UC Treatments / Results  Labs (all labs ordered are listed, but only abnormal results are displayed) Labs Reviewed - No data to display  EKG   Radiology DG Chest 2 View Result Date: 05/20/2024 EXAM: 2 VIEW(S) XRAY OF THE CHEST 05/20/2024 03:35:42 PM COMPARISON: 04/06/2024 CLINICAL HISTORY: Pain. Chest pain after lifting groceries last Thursday. FINDINGS: LUNGS AND PLEURA: Improved interstitial opacities. Biapical pleural-parenchymal scarring. No pulmonary edema. No pleural effusion. No pneumothorax. HEART AND MEDIASTINUM: Aortic atherosclerotic calcification. No acute abnormality of the cardiac silhouette. BONES AND SOFT TISSUES: Cervical spine fusion hardware. Levoconvex lumbar scoliosis with rotator component. IMPRESSION: 1. Cervical spine fusion hardware. 2. Aortic atherosclerotic calcification. 3. Biapical pleural-parenchymal scarring. 4. Levoconvex lumbar scoliosis with rotatory component. 5. Improved interstitial opacities. Electronically signed by: Ryan Salvage MD 05/20/2024 04:03 PM EDT RP Workstation: [id number]    Procedures Procedures (including critical care time)  Medications Ordered in UC Medications - No data to display  Initial Impression / Assessment and Plan / UC Course  I have reviewed the triage vital signs and the nursing notes.  Pertinent labs & imaging results that were available during my care of the patient were reviewed by me and  considered in my medical decision making (see  chart for details).     Chest wall pain-chest x-ray without any acute findings today.  The chest pain is reproducible upon palpation and deep breathing.  Doubt cardiac involvement at this time.  She had a catheterization approximate 1 month ago that did not have any concerns and no stents were done at that time. Recommended she can alternate heat and ice to the area.  I have prescribed a low-dose muscle relaxer to use as needed.  She can take Tylenol  for pain as needed.  Follow-up as needed Final Clinical Impressions(s) / UC Diagnoses   Final diagnoses:  Chest wall pain     Discharge Instructions      This is most likely chest wall pain.  Your x-ray did not show any concerns. Recommend Tylenol  for pain and I have prescribed a low-dose muscle relaxer. He can do heat and ice to the area.  Follow-up with your doctor as needed     ED Prescriptions     Medication Sig Dispense Auth. Provider   tiZANidine  (ZANAFLEX ) 4 MG tablet Take 1 tablet (4 mg total) by mouth every 12 (twelve) hours as needed for muscle spasms. 20 tablet Adah Wilbert LABOR, FNP      PDMP not reviewed this encounter.   Adah Wilbert LABOR, FNP 05/20/24 1727

## 2024-05-20 NOTE — ED Triage Notes (Signed)
 Patient reports 1 week ago,was bending over to pick up some soup on a lower shelf and felt a pop to her left shoulder and left side of her chest area. States pain palpable and able to be reproduced with palpation and movement. Patient states she has 4 stents with last one placed in 2020.  Patient unsure if this is at all related to her heart or she just moved wrong. No shortness of breath, no sweating. Hurts with cough, sneezing, and deep breath. Patient skin p/w/d. No cough, resp even and unlabored.

## 2024-07-11 ENCOUNTER — Ambulatory Visit

## 2024-07-18 ENCOUNTER — Ambulatory Visit: Admitting: Cardiology

## 2024-08-08 ENCOUNTER — Ambulatory Visit

## 2024-08-10 ENCOUNTER — Ambulatory Visit: Admitting: Cardiology

## 2024-08-31 ENCOUNTER — Ambulatory Visit

## 2024-08-31 ENCOUNTER — Telehealth: Payer: Self-pay | Admitting: Cardiology

## 2024-08-31 NOTE — Telephone Encounter (Signed)
 Patient called to report she will be out of town and canceled Renal test.

## 2024-09-01 ENCOUNTER — Ambulatory Visit: Payer: Self-pay

## 2024-09-09 ENCOUNTER — Ambulatory Visit: Payer: Self-pay | Admitting: Cardiology

## 2024-09-30 ENCOUNTER — Encounter: Payer: Self-pay | Admitting: Cardiology

## 2024-09-30 ENCOUNTER — Ambulatory Visit: Payer: Self-pay | Admitting: Cardiology

## 2024-09-30 VITALS — BP 150/74 | HR 78 | Ht 61.0 in | Wt 181.0 lb

## 2024-09-30 DIAGNOSIS — I480 Paroxysmal atrial fibrillation: Secondary | ICD-10-CM

## 2024-09-30 DIAGNOSIS — I447 Left bundle-branch block, unspecified: Secondary | ICD-10-CM

## 2024-09-30 DIAGNOSIS — E785 Hyperlipidemia, unspecified: Secondary | ICD-10-CM

## 2024-09-30 DIAGNOSIS — I10 Essential (primary) hypertension: Secondary | ICD-10-CM

## 2024-09-30 DIAGNOSIS — I2510 Atherosclerotic heart disease of native coronary artery without angina pectoris: Secondary | ICD-10-CM

## 2024-09-30 MED ORDER — CLOPIDOGREL BISULFATE 75 MG PO TABS: 75.0000 mg | ORAL_TABLET | Freq: Every day | ORAL | 3 refills | Status: AC

## 2024-09-30 MED ORDER — ROSUVASTATIN CALCIUM 20 MG PO TABS: 20.0000 mg | ORAL_TABLET | Freq: Every day | ORAL | 3 refills | Status: AC

## 2024-09-30 MED ORDER — CLOPIDOGREL BISULFATE 75 MG PO TABS: 75.0000 mg | ORAL_TABLET | Freq: Every day | ORAL | 3 refills | Status: DC

## 2024-09-30 NOTE — Addendum Note (Signed)
 Addended by: ARLOA MALLORY D on: 09/30/2024 02:12 PM   Modules accepted: Orders

## 2024-09-30 NOTE — Progress Notes (Signed)
 " Cardiology Office Note:    Date:  09/30/2024   ID:  Kaitlyn Jimenez, DOB 02-Feb-1951, MRN [id number]  PCP:  Pandora Therisa RAMAN, NP  Cardiologist:  Lamar Fitch, MD    Referring MD: Pandora Therisa RAMAN, NP   Chief Complaint  Patient presents with   Follow-up  Doing fine  History of Present Illness:     Kaitlyn Jimenez is a 74 y.o. female past medical history significant for coronary disease she did have PTCA and stenting done in 2007 2013 2020 last year she ended up being in the hospital because of chest pain troponin was elevated cardiac catheterization was done which did not show any new lesions..  Comes today to my office doing well denies have any chest pain tightness squeezing pressure burning chest.  She does have dyslipidemia takes statin took me some time to convince her to increase the dose because of not well-controlled cholesterol, she does have paroxysmal atrial fibrillation but no anticoagulation still does not want to take it.  Past Medical History:  Diagnosis Date   Atypical chest pain 01/07/2019   Coronary artery disease involving native coronary artery of native heart with angina pectoris 07/13/2017   Stenting in 2007, 2013 to LAD, last cardiac cath in 02/2019 w/ prox RCA stent and 50% ISR of LAD stent   Dyslipidemia 07/13/2017   Essential hypertension 07/13/2017   Gastroesophageal reflux disease without esophagitis 05/25/2020   Hypertension    MI (myocardial infarction) (HCC)    MI (myocardial infarction) (HCC)    Other hyperlipidemia 05/25/2020   Other specified hypothyroidism 05/25/2020   Primary hypertension 05/25/2020   Thyroid  disease    Unstable angina (HCC) 03/01/2019    Past Surgical History:  Procedure Laterality Date   ABDOMINAL HYSTERECTOMY     ATRIAL FIBRILLATION ABLATION N/A 06/21/2021   Procedure: ATRIAL FIBRILLATION ABLATION;  Surgeon: Inocencio Soyla Lunger, MD;  Location: [facility] INVASIVE CV LAB;  Service: Cardiovascular;  Laterality: N/A;   CARDIAC  CATHETERIZATION     CHOLECYSTECTOMY     CORONARY ANGIOPLASTY WITH STENT PLACEMENT     LEFT HEART CATH AND CORONARY ANGIOGRAPHY N/A 03/02/2019   Procedure: LEFT HEART CATH AND CORONARY ANGIOGRAPHY;  Surgeon: Wonda Sharper, MD;  Location: [facility] INVASIVE CV LAB;  Service: Cardiovascular;  Laterality: N/A;   LEFT HEART CATH AND CORONARY ANGIOGRAPHY N/A 04/07/2024   Procedure: LEFT HEART CATH AND CORONARY ANGIOGRAPHY;  Surgeon: Elmira Newman PARAS, MD;  Location: [facility] INVASIVE CV LAB;  Service: Cardiovascular;  Laterality: N/A;    Current Medications: Active Medications[1]   Allergies:   Bee venom, Penciclovir, Shellfish protein-containing drug products, Sulfa antibiotics, Brilinta  [ticagrelor ], Eliquis  [apixaban ], Prasugrel , Iodinated contrast media, Penicillins, and Ramipril   Social History   Socioeconomic History   Marital status: Married    Spouse name: Not on file   Number of children: Not on file   Years of education: Not on file   Highest education level: Not on file  Occupational History   Not on file  Tobacco Use   Smoking status: Former   Smokeless tobacco: Never  Vaping Use   Vaping status: Never Used  Substance and Sexual Activity   Alcohol use: No   Drug use: No   Sexual activity: Not on file  Other Topics Concern   Not on file  Social History Narrative   Not on file   Social Drivers of Health   Tobacco Use: Medium Risk (09/30/2024)   Patient History    Smoking Tobacco  Use: Former    Smokeless Tobacco Use: Never    Passive Exposure: Not on Actuary Strain: Low Risk  (07/12/2024)   Received from [facility]   Overall Financial Resource Strain (CARDIA)    Difficulty of Paying Living Expenses: Not hard at all  Food Insecurity: No Food Insecurity (07/12/2024)   Received from [facility]   Epic    Within the past 12 months, you worried that your food would run out before you got the money to buy more.: Never true     Within the past 12 months, the food you bought just didn't last and you didn't have money to get more.: Never true  Transportation Needs: No Transportation Needs (07/12/2024)   Received from [facility] - Transportation    In the past 12 months, has lack of transportation kept you from medical appointments or from getting medications?: No    Lack of Transportation (Non-Medical): No  Physical Activity: Not on file  Stress: Not on file  Social Connections: Moderately Isolated (04/08/2024)   Social Connection and Isolation Panel    Frequency of Communication with Friends and Family: More than three times a week    Frequency of Social Gatherings with Friends and Family: Twice a week    Attends Religious Services: Never    Database Administrator or Organizations: No    Attends Banker Meetings: Never    Marital Status: Married  Depression (PHQ2-9): Not on file  Alcohol Screen: Not on file  Housing: Low Risk  (07/12/2024)   Received from [facility]   Epic    In the last 12 months, was there a time when you were not able to pay the mortgage or rent on time?: No    In the past 12 months, how many times have you moved where you were living?: 0    At any time in the past 12 months, were you homeless or living in a shelter (including now)?: No  Utilities: Not At Risk (07/12/2024)   Received from [facility]   Epic    In the past 12 months has the electric, gas, oil, or water  company threatened to shut off services in your home?: No  Health Literacy: Not on file     Family History: The patient's family history includes Arrhythmia in her sister; Congestive Heart Failure in her brother; Heart attack in her brother and father; Heart disease in her maternal uncle, mother, and paternal uncle; Rheum arthritis in her brother. ROS:   Please see the history of present illness.    All 14 point review of systems negative except  as described per history of present illness  EKGs/Labs/Other Studies Reviewed:         Recent Labs: 04/06/2024: B Natriuretic Peptide 89.9 04/07/2024: ALT 12; BUN 9; Creatinine, Ser 1.00; Potassium 4.2; Sodium 131 04/08/2024: Hemoglobin 12.3; Platelets 315  Recent Lipid Panel    Component Value Date/Time   CHOL 218 (H) 04/07/2024 0327   CHOL 216 (H) 06/13/2022 1656   TRIG 78 04/07/2024 0327   HDL 47 04/07/2024 0327   HDL 59 06/13/2022 1656   CHOLHDL 4.6 04/07/2024 0327   VLDL 16 04/07/2024 0327   LDLCALC 155 (H) 04/07/2024 0327   LDLCALC 140 (H) 06/13/2022 1656    Physical Exam:    VS:  BP (!) 150/74   Pulse 78   Ht 5' 1 (  1.549 m)   Wt 181 lb (82.1 kg)   SpO2 99%   BMI 34.20 kg/m     Wt Readings from Last 3 Encounters:  09/30/24 181 lb (82.1 kg)  04/14/24 183 lb (83 kg)  04/07/24 176 lb (79.8 kg)     GEN:  Well nourished, well developed in no acute distress HEENT: Normal NECK: No JVD; No carotid bruits LYMPHATICS: No lymphadenopathy CARDIAC: RRR, no murmurs, no rubs, no gallops RESPIRATORY:  Clear to auscultation without rales, wheezing or rhonchi  ABDOMEN: Soft, non-tender, non-distended MUSCULOSKELETAL:  No edema; No deformity  SKIN: Warm and dry LOWER EXTREMITIES: no swelling NEUROLOGIC:  Alert and oriented x 3 PSYCHIATRIC:  Normal affect   ASSESSMENT:    1. Coronary artery disease due to lipid rich plaque   2. LBBB (left bundle branch block)   3. PAF (paroxysmal atrial fibrillation) (HCC)   4. Primary hypertension    PLAN:    In order of problems listed above:  Coronary disease seems to be stable from that continue on guideline directed medical therapy which I will continue. Left bundle branch block.  Chronic, noted. Paroxysmal atrial fibrillation maintained sinus rhythm refused to take any anticoagulation. Essential hypertension high in the office but she said at home always 120/70 therefore we will continue monitoring. Dyslipidemia she agreed  to increase dose of statin to 20 mg Crestor  fasting lipid profile AST LT will check in   Medication Adjustments/Labs and Tests Ordered: Current medicines are reviewed at length with the patient today.  Concerns regarding medicines are outlined above.  No orders of the defined types were placed in this encounter.  Medication changes:  Meds ordered this encounter  Medications   clopidogrel  (PLAVIX ) 75 MG tablet    Sig: Take 1 tablet (75 mg total) by mouth daily.    Dispense:  90 tablet    Refill:  3    Signed, Lamar DOROTHA Fitch, MD, [facility] 09/30/2024 2:02 PM    [facility]    [1]  Current Meds  Medication Sig   ALPRAZolam  (XANAX ) 1 MG tablet Take 1 mg by mouth 2 (two) times daily.   aspirin  EC 81 MG tablet Take 1 tablet (81 mg total) by mouth daily. Swallow whole.   clopidogrel  (PLAVIX ) 75 MG tablet Take 1 tablet (75 mg total) by mouth daily.   cyanocobalamin  (,VITAMIN B-12,) 1000 MCG/ML injection Inject 1,000 mcg into the muscle every 30 (thirty) days.   EPINEPHrine 0.3 mg/0.3 mL IJ SOAJ injection Inject 0.3 mg into the muscle as needed for anaphylaxis.   famotidine  (PEPCID ) 20 MG tablet Take 20 mg by mouth daily as needed for heartburn or indigestion.   fluticasone (FLONASE) 50 MCG/ACT nasal spray Place 1 spray into both nostrils daily as needed for allergies.   furosemide  (LASIX ) 20 MG tablet Take 20 mg by mouth as needed for fluid.   levothyroxine  (SYNTHROID ) 88 MCG tablet Take 88 mcg by mouth daily before breakfast.   metoprolol  tartrate (LOPRESSOR ) 50 MG tablet Take 1 tablet (50 mg total) by mouth 2 (two) times daily.   nitroGLYCERIN  (NITROSTAT ) 0.4 MG SL tablet Place 1 tablet (0.4 mg total) under the tongue every 5 (five) minutes as needed for chest pain.   polyvinyl alcohol (LIQUIFILM TEARS) 1.4 % ophthalmic solution Place 1 drop into both eyes as needed for dry eyes.   rosuvastatin  (CRESTOR ) 5 MG tablet Take 10 mg by mouth daily.   sodium chloride   (OCEAN) 0.65 % SOLN nasal  spray Place 1 spray into both nostrils as needed for congestion.   triamcinolone  ointment (KENALOG ) 0.1 % Apply 1 application topically as needed (demititis).   valsartan  (DIOVAN ) 160 MG tablet Take 1 tablet (160 mg total) by mouth daily.   Vitamin D, Ergocalciferol, (DRISDOL) 1.25 MG (50000 UT) CAPS capsule Take 50,000 Units by mouth every Wednesday.   "

## 2024-09-30 NOTE — Patient Instructions (Signed)
 Medication Instructions:   INCREASE: Crestor  to 20mg  daily   Lab Work: Your physician recommends that you return for lab work in: 6 weeks You need to have labs done when you are fasting.  You can come Monday through Friday 8:30 am to 12:00 pm and 1:15 to 4:30. You do not need to make an appointment as the order has already been placed.     Testing/Procedures: None Ordered   Follow-Up: At [facility], you and your health needs are our priority.  As part of our continuing mission to provide you with exceptional heart care, we have created designated Provider Care Teams.  These Care Teams include your primary Cardiologist (physician) and Advanced Practice Providers (APPs -  Physician Assistants and Nurse Practitioners) who all work together to provide you with the care you need, when you need it.  We recommend signing up for the patient portal called MyChart.  Sign up information is provided on this After Visit Summary.  MyChart is used to connect with patients for Virtual Visits (Telemedicine).  Patients are able to view lab/test results, encounter notes, upcoming appointments, etc.  Non-urgent messages can be sent to your provider as well.   To learn more about what you can do with MyChart, go to [email or web address].    Your next appointment:   6 month(s)  The format for your next appointment:   In Person  Provider:   Lamar Fitch, MD    Other Instructions NA

## 2024-10-06 ENCOUNTER — Ambulatory Visit: Payer: Self-pay | Admitting: Cardiology

## 2024-11-07 ENCOUNTER — Ambulatory Visit: Payer: Self-pay | Admitting: Cardiology
